# Patient Record
Sex: Male | Born: 1935
Health system: Southern US, Community
[De-identification: ages and names within clinical notes are randomized; demographics above are authoritative.]

## PROBLEM LIST (undated history)

## (undated) DIAGNOSIS — E119 Type 2 diabetes mellitus without complications: Secondary | ICD-10-CM

## (undated) DIAGNOSIS — N400 Enlarged prostate without lower urinary tract symptoms: Secondary | ICD-10-CM

## (undated) DIAGNOSIS — J449 Chronic obstructive pulmonary disease, unspecified: Secondary | ICD-10-CM

## (undated) DIAGNOSIS — I639 Cerebral infarction, unspecified: Secondary | ICD-10-CM

## (undated) DIAGNOSIS — R42 Dizziness and giddiness: Secondary | ICD-10-CM

## (undated) DIAGNOSIS — E785 Hyperlipidemia, unspecified: Secondary | ICD-10-CM

## (undated) DIAGNOSIS — I10 Essential (primary) hypertension: Secondary | ICD-10-CM

## (undated) DIAGNOSIS — N289 Disorder of kidney and ureter, unspecified: Secondary | ICD-10-CM

## (undated) HISTORY — DX: Hyperlipidemia, unspecified: E78.5

## (undated) HISTORY — DX: Cerebral infarction, unspecified: I63.9

## (undated) HISTORY — DX: Type 2 diabetes mellitus without complications: E11.9

## (undated) HISTORY — DX: Benign prostatic hyperplasia without lower urinary tract symptoms: N40.0

## (undated) HISTORY — DX: Chronic obstructive pulmonary disease, unspecified: J44.9

## (undated) HISTORY — DX: Dizziness and giddiness: R42

## (undated) HISTORY — PX: APPENDECTOMY: SHX54

---

## 2004-08-12 ENCOUNTER — Encounter (INDEPENDENT_AMBULATORY_CARE_PROVIDER_SITE_OTHER): Payer: Self-pay | Admitting: *Deleted

## 2004-08-12 ENCOUNTER — Ambulatory Visit (HOSPITAL_COMMUNITY): Admission: RE | Admit: 2004-08-12 | Discharge: 2004-08-12 | Payer: Self-pay | Admitting: Gastroenterology

## 2007-05-11 ENCOUNTER — Inpatient Hospital Stay (HOSPITAL_COMMUNITY): Admission: EM | Admit: 2007-05-11 | Discharge: 2007-05-15 | Payer: Self-pay | Admitting: Emergency Medicine

## 2007-06-06 ENCOUNTER — Inpatient Hospital Stay (HOSPITAL_COMMUNITY): Admission: EM | Admit: 2007-06-06 | Discharge: 2007-06-09 | Payer: Self-pay | Admitting: Emergency Medicine

## 2007-06-06 ENCOUNTER — Encounter (INDEPENDENT_AMBULATORY_CARE_PROVIDER_SITE_OTHER): Payer: Self-pay | Admitting: Surgery

## 2010-07-20 ENCOUNTER — Emergency Department (HOSPITAL_COMMUNITY): Admission: EM | Admit: 2010-07-20 | Discharge: 2010-07-20 | Payer: Self-pay | Admitting: Emergency Medicine

## 2011-01-26 NOTE — Op Note (Signed)
[name], [name]          ACCOUNT NO.:  [id number]   MEDICAL RECORD NO.:  [id number]          PATIENT TYPE:  INP   LOCATION:  5707                         FACILITY:  [facility]   PHYSICIAN:  Thornton Park. Daphine Deutscher, MD  DATE OF BIRTH:  1936-02-13   DATE OF PROCEDURE:  06/06/2007  DATE OF DISCHARGE:                               OPERATIVE REPORT   PREOPERATIVE DIAGNOSIS:  A ruptured appendix for interval appendectomy.   POSTOPERATIVE DIAGNOSIS:  A ruptured appendix for interval appendectomy.   PROCEDURE:  Laparoscopic appendectomy for perforated walled off  appendix.   SURGEON:  Thornton Park. Daphine Deutscher, MD   ANESTHESIA:  General.   DESCRIPTION OF PROCEDURE:  [name] was taken to the OR on  Tuesday night, June 06, 2007 approximately 9:00 p.m. and given  general anesthesia.  The abdomen was prepped with technique and draped  sterilely.  Access was gained through the umbilicus using Hasson  technique without difficulty.  After insufflation, 5 mm was placed in  the right upper quadrant and a 10-11 was placed obliquely in the left  lower quadrant.  The appendix had perforated and there was a tremendous  amount of inflammatory reaction in the right lower quadrant.  I used  blunt dissection to tease what appeared to be the appendiceal area and  abscess away and I got some drainage from the apparent abscess and I  sucked this out with the sucker and mobilized this.  I went across two  things that could have been stumps of the appendix and I divided those  with the Endo-GIA and dissect what actually I think was the stump of the  appendix.  I went ahead and then freed the rest from the mesentery using  a harmonic scalpel.  Bleeding was controlled with that and once I had at  all freed, I put it in a bag and brought it out through the umbilicus.  The small intestine was intact and was not damaged, nor was the cecum.  Bleeding was controlled.  I then irrigated and sucked out the  effluent  and then deflated the abdomen, closing the laparoscopic umbilicus under  laparoscopic vision. Again I was using the 30 degrees scope to see that  and once that was closed, I deflated the abdomen, closing the skin with  4-0 Vicryl, Benzoin Steri-Strips.  The areas were injected with some  Marcaine and the patient was taken to recovery room in satisfactory  condition.  He will be continued on Invanz which he received preop for  24 hours and will go back to the floor tonight.   FINAL DIAGNOSIS:  Status post laparoscopic appendectomy for perforated  walled off appendicitis.      Thornton Park Daphine Deutscher, MD  Electronically Signed     MBM/MEDQ  D:  06/06/2007  T:  06/07/2007  Job:  [id number]

## 2011-01-26 NOTE — Discharge Summary (Signed)
[name], [name]          ACCOUNT NO.:  [id number]   MEDICAL RECORD NO.:  [id number]          PATIENT TYPE:  INP   LOCATION:  5707                         FACILITY:  [facility]   PHYSICIAN:  Thornton Park. Daphine Deutscher, MD  DATE OF BIRTH:  July 22, 1936   DATE OF ADMISSION:  06/06/2007  DATE OF DISCHARGE:  06/09/2007                               DISCHARGE SUMMARY   ADMITTING DIAGNOSIS:  Recurrent appendicitis.   PROCEDURE:  Laparoscopic appendectomy on June 06, 2007, for  perforated wall appendicitis.   HOSPITAL COURSE:  [name] is a 75 year old gentleman who needed  an interval appendectomy, but had recurrent abdominal pain had  complications from his perforated appendix before that could be  scheduled electively.  He was brought in and done by me on June 06, 2007.  On postop day #1, he had difficulty voiding requiring a Foley  catheter which stayed in.  He was advanced to full liquids.  He was not  ready for discharge on September 25, but by June 09, 2007, he was  voiding without problems.  His lab was totally normal.  He was ready for  discharge.  He was given Tylox to take for pain.  Condition was good.  Return in 3 weeks.   DISCHARGE DIAGNOSIS:  Perforated appendix, status post laparoscopic  appendectomy.      Thornton Park Daphine Deutscher, MD  Electronically Signed     MBM/MEDQ  D:  06/09/2007  T:  06/09/2007  Job:  [id number]

## 2011-01-26 NOTE — H&P (Signed)
[name], [name] NO.:  [id number]   MEDICAL RECORD NO.:  [id number]          PATIENT TYPE:  INP   LOCATION:  5707                         FACILITY:  [facility]   PHYSICIAN:  Ardeth Sportsman, MD     DATE OF BIRTH:  [birth year]   DATE OF ADMISSION:  06/06/2007  DATE OF DISCHARGE:                              HISTORY & PHYSICAL   SURGEON:  Luretha Murphy, MD.   DIAGNOSIS:  Severe appendicitis with possible recurrence.   CHIEF COMPLAINT:  Recurrent abdominal pain and chills, with history of  appendicitis.   HISTORY OF PRESENT ILLNESS:  Mr. [name] is a 75 year old gentleman  who was seen at [facility] in late August, and tells me he was  admitted for 5 days on antibiotics, with surgical consultation by Dr.  Wenda Low.  He improved and was transitioned over to oral Augmentin.  The patient says he has been on this for about the past 3 weeks.  He was  due in followup with Dr. Daphine Deutscher today for consideration of timing of  possible interval appendectomy.  The patient noted about 2 to 3 days ago  he started having some chills and worsening abdominal pain.  He felt  some nausea but did not throw up.  He has not really had any diarrhea.  Normally he has  bowel movement about every day, but his bowels have  been irregular since this past month.  Based on worsening concerns, he  called the office, and recommendation was made for the patient to go to  the emergency room.  Emergency Room evaluated him, based on concerns  requested surgical evaluation.  Dr. Daphine Deutscher was not immediately  available, so he asked [place] to help take care of the patient.   PAST MEDICAL HISTORY:  1. Hypertension.  2. Hypercholesterolemia.  3. He had colonoscopy 3 years ago by Dr. Laural Benes, with a tubular      adenoma on a polypectomy.   PAST SURGICAL HISTORY:  Negative.   SOCIAL HISTORY:  Positive for tobacco.  Rarely any alcohol.  He is  currently retired.  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  Occasionally drinks  alcohol.   ALLERGIES:  NO KNOWN ALLERGIES.   MEDICATIONS:  Include:  1. Aspirin.  2. Augmentin.  3. Edwina Barth.  4. Simvastatin.   FAMILY HISTORY:  Noncontributory.   REVIEW OF SYSTEMS:  As noted per HPI.  CONSTITUTIONAL:  He has had some  subjective fevers and chills, and some sweats.  No weight gain or weight  loss.  EYES, ENT:  Negative.  CARDIAC:  He says normally he can walk  several miles a day without any difficulty.  He has had decreased  exercise tolerance with this abdominal pain for the past 3 months.  GU,  MUSCULOSKELETAL, NEUROLOGICAL, OPHTHALMOLOGIC, PSYCHIATRIC,  DERMATOLOGIC, TESTICULAR, BREASTS, are otherwise negative.   PHYSICAL EXAMINATION:  VITAL SIGNS:  T-max of 97.4.  Initially his blood  pressure was 191/99, with 8/10 pain.  Pulse 84.  Respirations 18.  After  getting a couple of Dilaudid his pulse  has come down to the 50's after  getting some metoprolol.  Has 98% sats on room air.  GENERAL:  He looks a little tired and disheveled, but not frankly toxic,  and in mild distress.  PSYCH:  He seems pleasant and interactive.  He speaks English pretty  well, but not 100% fluently.  No evidence of dementia, psychosis,  paranoia.  He has poor grooming.  EYES:  Pupils are equal, round and reactive to light.  Extraocular  movements are intact.  Sclerae are not icteric or injected.  HEENT:  He is normocephalic.  No facial asymmetry.  Mucous membranes are  dry.  Nasopharynx and oropharynx clear.  CHEST:  Clear to auscultation bilaterally.  No wheezes, rales, or  rhonchi.  HEART:  Regular rate and rhythm.  No murmurs, gallops or rubs.  No  carotid bruits.  Normal rate and sinus pedis pulses.  ABDOMEN:  The abdomen is obese, but soft.  He does have some tenderness  to palpation in his right lower quadrant and periumbilical region to  deep palpation, but no major peritonitis.   The rest of  the abdomen is  very soft.  GENITOURINARY:  Normal external male genitalia.  No evidence of inguinal  hernias.  RECTAL:  Deferred, by patient request.  EXTREMITIES:  No clubbing, cyanosis or edema.  MUSCULOSKELETAL:  Full range of motion of his shoulders, elbows, wrists,  hips, knees and ankles  LYMPH:  No head, neck, axillary or groin lymphadenopathy.  SKIN:  No obvious petechiae or purpura, no other sores or lesions.   LABORATORY VALUES:  He has a white count of 6.9, hemoglobin of 13.4.  He  has a borderline left shift.  His LFTs are normal.  His albumin is 4.2,  potassium is 3.4, BUN is 18, his creatinine is 1.1.  Urinalysis is  negative.  He does have a CT scan which shows persistent inflammation  around his appendix, although it seems to be decreased.  There is no  obvious localized  fluid collection.  He is full of stool in his colon  on the right side.  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  It may have increased 1 mm from 7.7 to around 9 mm in size.  There is no  other evidence of bowel stricture or any other abnormalities.   ASSESSMENT AND PLAN:  A 75 year old gentleman with diagnosed  appendicitis that was rather severe, improved on antibiotics, but now  with recurrent pain.   He still has some persistent abdominal pain with his fevers and chills.  I am wary of a man already on oral antibiotics to go home at this point.  I will defer to Dr. Daphine Deutscher on his final evaluation what to do.  I think  it would be reasonable to admit the patient, put him on IV antibiotics,  and follow closely.  Given the pseudoaneurysm near his iliac region, it  may be wise to try and let this thing cool down further before  operating, but that may not be a choice if he has persistent discomfort  on antibiotics.   There is no abscess to drain, so interventional radiology is probably  not a reasonable  choice at this time.  We will await a final decision by  Dr. Daphine Deutscher on what he wishes to do, but I suspect we will admit and  place him on IV  Invanz and IV fluids.  NPO right now.  When his pain subsides, and if he  does not have worsening leukocytosis or symptoms, then advance his diet  and consider either appendectomy now, versus transitioning to a  different antibiotic regimen such as Cipro and Flagyl, and follow  closely.      Ardeth Sportsman, MD  Electronically Signed     SCG/MEDQ  D:  06/06/2007  T:  06/06/2007  Job:  [id number]

## 2011-01-29 NOTE — Op Note (Signed)
NAMETERESO, UNANGST NO.:  [id number]   MEDICAL RECORD NO.:  [id number]          PATIENT TYPE:   LOCATION:                                 FACILITY:   PHYSICIAN:  Danise Edge, M.D.        DATE OF BIRTH:   DATE OF PROCEDURE:  08/12/2004  DATE OF DISCHARGE:                                 OPERATIVE REPORT   PROCEDURE PERFORMED:  Colonoscopy and polypectomy.   ENDOSCOPIST:  Charolett Bumpers, M.D.   INDICATIONS FOR PROCEDURE:  Mr. [name] is a 75 year old male  born 29-Mar-1936.  The patient is scheduled to undergo his first  screening colonoscopy with polypectomy to prevent colon cancer.   PREMEDICATION:  Versed 5 mg, Demerol 50 mg.   DESCRIPTION OF PROCEDURE:  After obtaining informed consent, Mr. [name]  was placed in the left lateral decubitus position.  I administered  intravenous Demerol and intravenous Versed to achieve conscious sedation for  the procedure.  The patient's blood pressure, oxygen saturations and cardiac  rhythm were monitored throughout the procedure and documented in the medical  record.   Anal inspection was normal.  Digital rectal exam revealed a nonnodular  prostate.  The Olympus adjustable pediatric colonoscope was introduced into  the rectum and advanced to the cecum.  Colonic preparation for the exam  today was excellent.   Rectum:  Normal.   Sigmoid colon and descending colon:  At 40 cm from the anal verge, a 2 mm  sessile polyp was removed with electrocautery snare.   Splenic flexure:  Normal.   Transverse colon:  From the distal transverse colon, a 1 mm sessile polyp  was removed with electrocautery snare.  From the proximal transverse colon,  a 2 mm sessile polyp was lifted by submucosal saline injection and removed  with electrocautery snare.   Hepatic flexure:  Normal.   Ascending colon:  Normal.   Cecum and ileocecal valve:  Normal.   ASSESSMENT:  Two small polyps were removed from the  transverse colon and a  small polyp was removed from the sigmoid colon.  All polyps were submitted  in one bottle for pathologic evaluation.       ___________________________________________  Danise Edge, M.D.    MJ/MEDQ  D:  08/12/2004  T:  08/12/2004  Job:  [id number]   cc:   Teena Irani. Arlyce Dice, M.D.  [id number]  [place]  [place] [address]  Fax: [phone]

## 2011-01-29 NOTE — Discharge Summary (Signed)
[name], [name]          ACCOUNT NO.:  [id number]   MEDICAL RECORD NO.:  [id number]          PATIENT TYPE:  INP   LOCATION:  1343                         FACILITY:  [facility]   PHYSICIAN:  Thornton Park. Daphine Deutscher, MD  DATE OF BIRTH:  October 25, 1935   DATE OF ADMISSION:  05/11/2007  DATE OF DISCHARGE:  05/15/2007                               DISCHARGE SUMMARY   DIAGNOSIS:  Ruptured appendicitis.   DISCHARGE DIAGNOSIS:  Ruptured appendicitis.   PLAN:  Interval appendectomy.   COURSE HOSPITAL:  This is a 75 year old gentleman who had a 3 day  history of abdominal pain and CT scan that suggested he had already  perforated his appendix.  This was reviewed for perc. drainage, but the  little collection was too small to drain, so he was placed on  antibiotics and started on Augmentin which he was kept on.  Arrangements  were made for him to come into the office to schedule an interval  appendectomy.   DISCHARGE DIAGNOSIS:  Ruptured appendix.   PLAN:  Return for interval appendectomy.   CONDITION:  Improved.      Thornton Park Daphine Deutscher, MD  Electronically Signed     MBM/MEDQ  D:  06/13/2007  T:  06/13/2007  Job:  [id number]   cc:   [facility]

## 2011-01-29 NOTE — Discharge Summary (Signed)
NAMETHURSTON, BRENDLINGER          ACCOUNT NO.:  [id number]   MEDICAL RECORD NO.:  [id number]          PATIENT TYPE:  INP   LOCATION:  5707                         FACILITY:  [facility]   PHYSICIAN:  Thornton Park. Daphine Deutscher, MD  DATE OF BIRTH:  13-Aug-1936   DATE OF ADMISSION:  06/06/2007  DATE OF DISCHARGE:  06/09/2007                               DISCHARGE SUMMARY   This chart indicates that I have already dictated a Discharge Summary,  but I will dictate it again.   ADMISSION DIAGNOSIS:  Recurrent appendicitis.   PROCEDURE:  June 06, 2007, laparoscopic appendectomy for perforated  walled off appendix.   COURSE IN HOSPITAL:  The patient came in the hospital and had the above-  mentioned operation. He did well.  He was discharged on September 26  with Tylenol for pain and asked return to the office in 3 weeks.   FINAL DIAGNOSIS:  Acute appendicitis with perforation.      Thornton Park Daphine Deutscher, MD  Electronically Signed     MBM/MEDQ  D:  07/03/2007  T:  07/03/2007  Job:  [id number]

## 2011-06-24 LAB — CBC
HCT: 39.4
Hemoglobin: 13.4
MCHC: 33.9
MCV: 88.9
MCV: 89.7
Platelets: 144 — ABNORMAL LOW
Platelets: 152
Platelets: 167
RBC: 4.09 — ABNORMAL LOW
RDW: 13.7
RDW: 14.1 — ABNORMAL HIGH
WBC: 5.8

## 2011-06-24 LAB — BASIC METABOLIC PANEL
BUN: 12
BUN: 9
CO2: 29
Calcium: 8.8
Chloride: 102
Chloride: 104
Creatinine, Ser: 1.13
GFR calc Af Amer: >60
GFR calc Af Amer: >60
GFR calc non Af Amer: 57 — ABNORMAL LOW
GFR calc non Af Amer: >60
Glucose, Bld: 111 — ABNORMAL HIGH
Potassium: 3.5
Potassium: 3.8
Sodium: 137

## 2011-06-24 LAB — HEPATIC FUNCTION PANEL
ALT: 11
Alkaline Phosphatase: 56
Bilirubin, Direct: <0.1

## 2011-06-24 LAB — DIFFERENTIAL
Basophils Absolute: 0
Basophils Absolute: 0.1
Basophils Relative: 0
Eosinophils Absolute: 0.2
Eosinophils Absolute: 0.3
Eosinophils Relative: 3
Eosinophils Relative: 3
Lymphocytes Relative: 21
Lymphocytes Relative: 21
Lymphocytes Relative: 22
Lymphs Abs: 1.2
Monocytes Absolute: 0.3
Monocytes Absolute: 0.5
Monocytes Relative: 6
Neutro Abs: 5.5
Neutrophils Relative %: 66

## 2011-06-24 LAB — I-STAT 8, (EC8 V) (CONVERTED LAB)
BUN: 18
Bicarbonate: 28.8 — ABNORMAL HIGH
Chloride: 106
pCO2, Ven: 51.3 — ABNORMAL HIGH
pH, Ven: 7.357 — ABNORMAL HIGH

## 2011-06-24 LAB — URINALYSIS, ROUTINE W REFLEX MICROSCOPIC
Glucose, UA: NEGATIVE
Specific Gravity, Urine: 1.018
pH: 7.5

## 2011-06-24 LAB — POCT I-STAT CREATININE: Creatinine, Ser: 1.1

## 2011-06-24 LAB — LIPASE, BLOOD: Lipase: 35

## 2011-06-25 LAB — URINALYSIS, ROUTINE W REFLEX MICROSCOPIC
Glucose, UA: NEGATIVE
Leukocytes, UA: NEGATIVE
Specific Gravity, Urine: 1.022
pH: 5.5

## 2011-06-25 LAB — CBC
HCT: 39.1
Hemoglobin: 11.5 — ABNORMAL LOW
Hemoglobin: 13.6
MCV: 89.4
RBC: 3.77 — ABNORMAL LOW
RBC: 4.38
RDW: 14
WBC: 10.6 — ABNORMAL HIGH

## 2011-06-25 LAB — DIFFERENTIAL
Basophils Absolute: 0
Basophils Absolute: 0
Basophils Relative: 0
Basophils Relative: 0
Lymphocytes Relative: 11 — ABNORMAL LOW
Lymphocytes Relative: 7 — ABNORMAL LOW
Monocytes Absolute: 0.4
Monocytes Relative: 5
Neutro Abs: 7.6
Neutro Abs: 9.4 — ABNORMAL HIGH
Neutrophils Relative %: 83 — ABNORMAL HIGH

## 2011-06-25 LAB — COMPREHENSIVE METABOLIC PANEL
Alkaline Phosphatase: 47
BUN: 17
CO2: 22
Chloride: 105
Creatinine, Ser: 1.31
GFR calc non Af Amer: 54 — ABNORMAL LOW
Glucose, Bld: 119 — ABNORMAL HIGH
Total Bilirubin: 1.3 — ABNORMAL HIGH

## 2011-06-25 LAB — URINE MICROSCOPIC-ADD ON

## 2011-06-25 LAB — LIPASE, BLOOD: Lipase: 13

## 2011-09-27 ENCOUNTER — Other Ambulatory Visit: Payer: Self-pay | Admitting: Gastroenterology

## 2012-01-31 ENCOUNTER — Ambulatory Visit
Admission: RE | Admit: 2012-01-31 | Discharge: 2012-01-31 | Disposition: A | Discharge status: Home / Self Care | Payer: Medicare Other | Source: Ambulatory Visit | Attending: Internal Medicine | Admitting: Internal Medicine

## 2012-01-31 ENCOUNTER — Other Ambulatory Visit: Payer: Self-pay | Admitting: Internal Medicine

## 2012-01-31 DIAGNOSIS — R05 Cough: Secondary | ICD-10-CM

## 2012-03-01 ENCOUNTER — Emergency Department (HOSPITAL_COMMUNITY): Payer: Medicare Other

## 2012-03-01 ENCOUNTER — Inpatient Hospital Stay (HOSPITAL_COMMUNITY)
Admission: EM | Admit: 2012-03-01 | Discharge: 2012-03-03 | DRG: 065 | Disposition: A | Discharge status: Home / Self Care | Payer: Medicare Other | Attending: Internal Medicine | Admitting: Internal Medicine

## 2012-03-01 ENCOUNTER — Encounter (HOSPITAL_COMMUNITY): Payer: Self-pay | Admitting: Emergency Medicine

## 2012-03-01 DIAGNOSIS — I639 Cerebral infarction, unspecified: Secondary | ICD-10-CM

## 2012-03-01 DIAGNOSIS — R2990 Unspecified symptoms and signs involving the nervous system: Secondary | ICD-10-CM

## 2012-03-01 DIAGNOSIS — N289 Disorder of kidney and ureter, unspecified: Secondary | ICD-10-CM

## 2012-03-01 DIAGNOSIS — E876 Hypokalemia: Secondary | ICD-10-CM

## 2012-03-01 DIAGNOSIS — I6350 Cerebral infarction due to unspecified occlusion or stenosis of unspecified cerebral artery: Principal | ICD-10-CM | POA: Diagnosis present

## 2012-03-01 DIAGNOSIS — R4789 Other speech disturbances: Secondary | ICD-10-CM | POA: Diagnosis present

## 2012-03-01 DIAGNOSIS — I6340 Cerebral infarction due to embolism of unspecified cerebral artery: Secondary | ICD-10-CM

## 2012-03-01 DIAGNOSIS — I129 Hypertensive chronic kidney disease with stage 1 through stage 4 chronic kidney disease, or unspecified chronic kidney disease: Secondary | ICD-10-CM | POA: Diagnosis present

## 2012-03-01 DIAGNOSIS — D631 Anemia in chronic kidney disease: Secondary | ICD-10-CM | POA: Diagnosis present

## 2012-03-01 DIAGNOSIS — Z87891 Personal history of nicotine dependence: Secondary | ICD-10-CM

## 2012-03-01 DIAGNOSIS — E1169 Type 2 diabetes mellitus with other specified complication: Secondary | ICD-10-CM | POA: Insufficient documentation

## 2012-03-01 DIAGNOSIS — G8190 Hemiplegia, unspecified affecting unspecified side: Secondary | ICD-10-CM | POA: Diagnosis present

## 2012-03-01 DIAGNOSIS — E785 Hyperlipidemia, unspecified: Secondary | ICD-10-CM | POA: Diagnosis present

## 2012-03-01 DIAGNOSIS — Z79899 Other long term (current) drug therapy: Secondary | ICD-10-CM

## 2012-03-01 DIAGNOSIS — D649 Anemia, unspecified: Secondary | ICD-10-CM

## 2012-03-01 DIAGNOSIS — N189 Chronic kidney disease, unspecified: Secondary | ICD-10-CM | POA: Diagnosis present

## 2012-03-01 DIAGNOSIS — R29810 Facial weakness: Secondary | ICD-10-CM | POA: Diagnosis present

## 2012-03-01 DIAGNOSIS — I10 Essential (primary) hypertension: Secondary | ICD-10-CM

## 2012-03-01 HISTORY — DX: Disorder of kidney and ureter, unspecified: N28.9

## 2012-03-01 HISTORY — DX: Essential (primary) hypertension: I10

## 2012-03-01 LAB — COMPREHENSIVE METABOLIC PANEL
ALT: 12 U/L (ref 0–53)
AST: 17 U/L (ref 0–37)
Albumin: 3.9 g/dL (ref 3.5–5.2)
Alkaline Phosphatase: 50 U/L (ref 39–117)
BUN: 26 mg/dL — ABNORMAL HIGH (ref 6–23)
Chloride: 105 mEq/L (ref 96–112)
Potassium: 3.7 mEq/L (ref 3.5–5.1)
Sodium: 140 mEq/L (ref 135–145)
Total Bilirubin: 0.4 mg/dL (ref 0.3–1.2)
Total Protein: 7.3 g/dL (ref 6.0–8.3)

## 2012-03-01 LAB — DIFFERENTIAL
Basophils Absolute: 0 10*3/uL (ref 0.0–0.1)
Basophils Relative: 1 % (ref 0–1)
Eosinophils Absolute: 0.2 10*3/uL (ref 0.0–0.7)
Monocytes Relative: 6 % (ref 3–12)
Neutro Abs: 3.8 10*3/uL (ref 1.7–7.7)
Neutrophils Relative %: 65 % (ref 43–77)

## 2012-03-01 LAB — TROPONIN I: Troponin I: <0.3 ng/mL (ref <0.30)

## 2012-03-01 LAB — POCT I-STAT, CHEM 8
Chloride: 107 mEq/L (ref 96–112)
Glucose, Bld: 133 mg/dL — ABNORMAL HIGH (ref 70–99)
HCT: 39 % (ref 39.0–52.0)
Potassium: 3.8 mEq/L (ref 3.5–5.1)
Sodium: 144 mEq/L (ref 135–145)

## 2012-03-01 LAB — PROTIME-INR
INR: 1.05 (ref 0.00–1.49)
Prothrombin Time: 13.9 seconds (ref 11.6–15.2)

## 2012-03-01 LAB — CBC
Hemoglobin: 12.3 g/dL — ABNORMAL LOW (ref 13.0–17.0)
MCH: 29.6 pg (ref 26.0–34.0)
MCHC: 32.3 g/dL (ref 30.0–36.0)
Platelets: 159 10*3/uL (ref 150–400)

## 2012-03-01 LAB — APTT: aPTT: 30 seconds (ref 24–37)

## 2012-03-01 LAB — CK TOTAL AND CKMB (NOT AT ARMC): Total CK: 247 U/L — ABNORMAL HIGH (ref 7–232)

## 2012-03-01 MED ORDER — ASPIRIN 81 MG PO CHEW
324.0000 mg | CHEWABLE_TABLET | Freq: Once | ORAL | Status: AC
  Administered 2012-03-01: 324 mg via ORAL
  Filled 2012-03-01: qty 4

## 2012-03-01 NOTE — ED Notes (Signed)
Blood sugar 136

## 2012-03-01 NOTE — Consult Note (Signed)
  Chief Complaint: Left facial and upper extremity weakness.  HPI: [name] is an 76 y.o. male history of hypertension and hyperlipidemia experiencing onset of weakness involving his left hand while gardening at about 6 PM today. Family noted that he had left lower facial weakness and speech was slightly slurred. There is no previous history of stroke nor TIA. He has not been on antiplatelet therapy. CT scan of his head showed no acute intracranial abnormality. NIH stroke score was 2. Patient was not considered a candidate for thrombolytic therapy with TPA because of mild deficits only.  LSN: 6 PM today tPA Given: No: Mild deficits only MRankin: 0  Past Medical History  Diagnosis Date  . Renal disorder     History reviewed. No pertinent family history.   Medications: Prior to Admission:  Norvasc 2.5 mg per day Fenofibrate 160 mg per day Lisinopril 10 mg per day  Physical Examination: Blood pressure 176/97, pulse 74, temperature 98.3 F (36.8 C), temperature source Oral, resp. rate 21, height 5\' 5"  (1.651 m), weight 71.668 kg (158 lb), SpO2 99.00%.  Neurologic Examination: Mental Status: Alert, oriented, thought content appropriate.  Speech minimally dysarthric without evidence of aphasia. Able to follow commands without difficulty. Cranial Nerves: II-Visual fields were normal. III/IV/VI-Pupils were equal and reacted. Extraocular movements were full and conjugate.    V/VII-no facial numbness, mild left lower facial weakness. VIII-normal. X-minimal dysarthria. Motor: Slightly reduced strength distally involving left upper extremity compared to the right; otherwise normal motor exam. Sensory: Normal throughout. Deep Tendon Reflexes: 2+ and symmetric. Plantars: Mute bilaterally Cerebellar: Normal finger-to-nose testing. Carotid auscultation: Normal   Ct Head Wo Contrast  03/01/2012  *RADIOLOGY REPORT*  Clinical Data: 76 year old male with left arm pain.  Stroke  symptoms.  CT HEAD WITHOUT CONTRAST  Technique:  Contiguous axial images were obtained from the base of the skull through the vertex without contrast.  Comparison: None.  Findings: Sclerosis of the left mastoids probably is chronic. There may be an anterior ethmoid mucocele on the right.  Other paranasal sinuses and mastoids are clear.  No acute osseous abnormality identified.  Visualized orbits and scalp soft tissues are within normal limits.  No ventriculomegaly. No midline shift, mass effect, or evidence of mass lesion.  No acute intracranial hemorrhage identified.  No evidence of cortically based acute infarction identified.  Normal gray-white matter differentiation throughout the brain.  Somewhat conspicuous left ICA terminus and left MCA posterior sylvian division, but similar to other intracranial vascular density.  Overall no suspicious intracranial vascular hyperdensity.  IMPRESSION: Noncontrast CT appearance of the brain is within normal limits for age.  Original Report Authenticated By: Harley Hallmark, M.D.    Assessment: 76 y.o. male with probable acute right subcortical small vessel ischemic infarction.  Stroke Risk Factors - hyperlipidemia and hypertension  Plan: 1. HgbA1c, fasting lipid panel 2. MRI, MRA  of the brain without contrast 3. PT consult, OT consult, Speech consult 4. Echocardiogram 5. Carotid dopplers 6. Prophylactic therapy-Antiplatelet med: Aspirin 325 mg per day 7. Risk factor modification 8. Telemetry monitoring  C.R. Roseanne Reno, MD [facility] [phone]  03/01/2012, 10:27 PM

## 2012-03-01 NOTE — H&P (Addendum)
[name] is an 76 y.o. male.   PCP - Dr.Ronald Su Hilt. Chief Complaint: Left upper extremity weakness. HPI: 76 year old male with history of hypertension and hyperlipidemia and was recently found to have mild hyperglycemia by the PCP last evening around 6 PM while in his garden and his house suddenly felt weak in his left hand and decreased grip strength. Family who was by the side also noticed some left-sided facial droop and slurred speech. Patient was brought to the ER. CT of the head was negative for anything acute. Neurologist Dr. Roseanne Reno had assessed the patient and felt the patient is not a candidate for TPA due to minimal deficits. Patient at this time will be admitted for further management. Patient still have mild weakness in the left upper extremity. There is no obvious facial asymmetry at this time. Patient denies any difficulty swallowing or did not have any visual symptoms. Denies any headache or loss of consciousness.   Past Medical History  Diagnosis Date  . Renal disorder   . Hypertension     History reviewed. No pertinent past surgical history.  History reviewed. No pertinent family history. Social History:  reports that he has quit smoking. He does not have any smokeless tobacco history on file. He reports that he drinks alcohol. He reports that he does not use illicit drugs.  Allergies: No Known Allergies   (Not in a hospital admission)  Results for orders placed during the hospital encounter of 03/01/12 (from the past 48 hour(s))  GLUCOSE, CAPILLARY     Status: Abnormal   Collection Time   03/01/12  8:16 PM      Component Value Range Comment   Glucose-Capillary 136 (*) 70 - 99 mg/dL   PROTIME-INR     Status: Normal   Collection Time   03/01/12  8:40 PM      Component Value Range Comment   Prothrombin Time 13.9  11.6 - 15.2 seconds    INR 1.05  0.00 - 1.49   APTT     Status: Normal   Collection Time   03/01/12  8:40 PM      Component Value Range Comment    aPTT 30  24 - 37 seconds   CBC     Status: Abnormal   Collection Time   03/01/12  8:40 PM      Component Value Range Comment   WBC 5.8  4.0 - 10.5 K/uL    RBC 4.16 (*) 4.22 - 5.81 MIL/uL    Hemoglobin 12.3 (*) 13.0 - 17.0 g/dL    HCT 40.9 (*) 81.1 - 52.0 %    MCV 91.6  78.0 - 100.0 fL    MCH 29.6  26.0 - 34.0 pg    MCHC 32.3  30.0 - 36.0 g/dL    RDW 91.4  78.2 - 95.6 %    Platelets 159  150 - 400 K/uL   DIFFERENTIAL     Status: Normal   Collection Time   03/01/12  8:40 PM      Component Value Range Comment   Neutrophils Relative 65  43 - 77 %    Neutro Abs 3.8  1.7 - 7.7 K/uL    Lymphocytes Relative 24  12 - 46 %    Lymphs Abs 1.4  0.7 - 4.0 K/uL    Monocytes Relative 6  3 - 12 %    Monocytes Absolute 0.4  0.1 - 1.0 K/uL    Eosinophils Relative 4  0 - 5 %  Eosinophils Absolute 0.2  0.0 - 0.7 K/uL    Basophils Relative 1  0 - 1 %    Basophils Absolute 0.0  0.0 - 0.1 K/uL   COMPREHENSIVE METABOLIC PANEL     Status: Abnormal   Collection Time   03/01/12  8:40 PM      Component Value Range Comment   Sodium 140  135 - 145 mEq/L    Potassium 3.7  3.5 - 5.1 mEq/L    Chloride 105  96 - 112 mEq/L    CO2 25  19 - 32 mEq/L    Glucose, Bld 138 (*) 70 - 99 mg/dL    BUN 26 (*) 6 - 23 mg/dL    Creatinine, Ser 7.82 (*) 0.50 - 1.35 mg/dL    Calcium 9.7  8.4 - 95.6 mg/dL    Total Protein 7.3  6.0 - 8.3 g/dL    Albumin 3.9  3.5 - 5.2 g/dL    AST 17  0 - 37 U/L    ALT 12  0 - 53 U/L    Alkaline Phosphatase 50  39 - 117 U/L    Total Bilirubin 0.4  0.3 - 1.2 mg/dL    GFR calc non Af Amer 45 (*) >90 mL/min    GFR calc Af Amer 52 (*) >90 mL/min   CK TOTAL AND CKMB     Status: Abnormal   Collection Time   03/01/12  8:40 PM      Component Value Range Comment   Total CK 247 (*) 7 - 232 U/L    CK, MB 3.3  0.3 - 4.0 ng/mL    Relative Index 1.3  0.0 - 2.5   TROPONIN I     Status: Normal   Collection Time   03/01/12  8:40 PM      Component Value Range Comment   Troponin I <0.30  <0.30  ng/mL   POCT I-STAT, CHEM 8     Status: Abnormal   Collection Time   03/01/12  8:51 PM      Component Value Range Comment   Sodium 144  135 - 145 mEq/L    Potassium 3.8  3.5 - 5.1 mEq/L    Chloride 107  96 - 112 mEq/L    BUN 29 (*) 6 - 23 mg/dL    Creatinine, Ser 2.13 (*) 0.50 - 1.35 mg/dL    Glucose, Bld 086 (*) 70 - 99 mg/dL    Calcium, Ion 5.78  4.69 - 1.32 mmol/L    TCO2 22  0 - 100 mmol/L    Hemoglobin 13.3  13.0 - 17.0 g/dL    HCT 62.9  52.8 - 41.3 %    Ct Head Wo Contrast  03/01/2012  *RADIOLOGY REPORT*  Clinical Data: 77 year old male with left arm pain.  Stroke symptoms.  CT HEAD WITHOUT CONTRAST  Technique:  Contiguous axial images were obtained from the base of the skull through the vertex without contrast.  Comparison: None.  Findings: Sclerosis of the left mastoids probably is chronic. There may be an anterior ethmoid mucocele on the right.  Other paranasal sinuses and mastoids are clear.  No acute osseous abnormality identified.  Visualized orbits and scalp soft tissues are within normal limits.  No ventriculomegaly. No midline shift, mass effect, or evidence of mass lesion.  No acute intracranial hemorrhage identified.  No evidence of cortically based acute infarction identified.  Normal gray-white matter differentiation throughout the brain.  Somewhat conspicuous left ICA terminus and left  MCA posterior sylvian division, but similar to other intracranial vascular density.  Overall no suspicious intracranial vascular hyperdensity.  IMPRESSION: Noncontrast CT appearance of the brain is within normal limits for age.  Original Report Authenticated By: Harley Hallmark, M.D.    Review of Systems  Constitutional: Negative.   HENT: Negative.   Eyes: Negative.   Respiratory: Negative.   Cardiovascular: Negative.   Gastrointestinal: Negative.   Genitourinary: Negative.   Musculoskeletal: Negative.   Skin: Negative.   Neurological:       Left upper extremity weakness.    Endo/Heme/Allergies: Negative.   Psychiatric/Behavioral: Negative.     Blood pressure 151/88, pulse 63, temperature 98.3 F (36.8 C), temperature source Oral, resp. rate 20, height 5\' 5"  (1.651 m), weight 71.668 kg (158 lb), SpO2 98.00%. Physical Exam  Constitutional: He is oriented to person, place, and time. He appears well-developed and well-nourished. No distress.  HENT:  Head: Normocephalic and atraumatic.  Right Ear: External ear normal.  Left Ear: External ear normal.  Nose: Nose normal.  Mouth/Throat: Oropharynx is clear and moist. No oropharyngeal exudate.  Eyes: Conjunctivae are normal. Pupils are equal, round, and reactive to light. Right eye exhibits no discharge. Left eye exhibits no discharge. No scleral icterus.  Neck: Normal range of motion. Neck supple.  Cardiovascular: Normal rate and regular rhythm.   Respiratory: Effort normal and breath sounds normal. No respiratory distress. He has no wheezes. He has no rales.  GI: Soft. Bowel sounds are normal. He exhibits no distension. There is no tenderness. There is no rebound.  Neurological: He is alert and oriented to person, place, and time.       Left upper extremity weakness 2/5. Rest of the extremities are 5/5. No facial asymmetry. No tongue deviation.  Skin: Skin is warm and dry. He is not diaphoretic.  Psychiatric: His behavior is normal.     Assessment/Plan #1. CVA  - patient will be placed on neuro checks. Swallow evaluation. MRI/MRA brain, carotid Doppler and 2-D echo. Aspirin. Telemetry shows sinus rhythm. #2. Hypertension - continue present medications. If creatinine worsens may have to hold lisinopril. #3. Hyperlipidemia - continue present medication. Check lipid panel.  #4. Renal insufficiency probably chronic - follow metabolic panel. Check urinalysis. May have to hold lisinopril if creatinine worsens. #5. Mild normocytic normochromic anemia - may be from chronic kidney disease. Check anemia panel. Follow  CBC. Patient states he had a colonoscopy 3 months ago which was normal. #6. History of cigarette smoking quit 3 years ago  -  follow chest x-ray.   CODE STATUS - full code.  Eduard Clos. 03/01/2012, 11:57 PM

## 2012-03-01 NOTE — Code Documentation (Signed)
Patient arrived via [facility] from [facility] ED to [facility] ED at 2112. Patient reported left arm weakness and facial droop at 1800 while out in the garden. Code stroke called at 2043, patient arrived to [facility] ED at 2112, EDP exam at [facility] ED 2113, Stroke team arrived at 2100, LSN at 1800, patient had CT and Lab work at [facility] ED, CT read by Dr. Roseanne Reno at 2113. NIH 02 for slurred speech and facial droop, code stroke cancelled by Dr. Roseanne Reno at 2132.

## 2012-03-01 NOTE — ED Notes (Signed)
[facility] at bedside 

## 2012-03-01 NOTE — ED Provider Notes (Signed)
History     CSN: [id number]  Arrival date & time 03/01/12  1956   First MD Initiated Contact with Patient 03/01/12 2019      Chief Complaint  Patient presents with  . Stroke Symptoms    (Consider location/radiation/quality/duration/timing/severity/associated sxs/prior treatment) Patient is a 76 y.o. male presenting with neurologic complaint. The history is provided by the patient. No language interpreter was used.  Neurologic Problem The primary symptoms include paresthesias, focal weakness, loss of sensation and speech change. Primary symptoms do not include headaches, syncope, loss of consciousness, altered mental status, seizures, dizziness, visual change, memory loss, fever, nausea or vomiting. The symptoms began 2 to 6 hours ago. The symptoms are worsening. The neurological symptoms are focal.  Paresthesias began 1 - 3 hours ago. The paresthesias are worsening. The paresthesias are described as tingling. Affected locations include the: left upper arm, left forearm and left hand.  Weakness began 1 - 3 hours ago. The weakness is worsening.  Change in speech began 1 - 3 hours ago. The speech change is worsening. Features of the speech change include inability to speak fluently.  Additional symptoms include weakness. Additional symptoms do not include neck stiffness, lower back pain, leg pain, loss of balance or photophobia. Workup history includes CT scan.    Past Medical History  Diagnosis Date  . Renal disorder     History reviewed. No pertinent past surgical history.  History reviewed. No pertinent family history.  History  Substance Use Topics  . Smoking status: Never Smoker   . Smokeless tobacco: Not on file  . Alcohol Use: No      Review of Systems  Constitutional: Negative for fever, activity change, appetite change, fatigue and unexpected weight change.  HENT: Negative for congestion, sore throat, rhinorrhea, neck pain and neck stiffness.   Eyes: Negative for  photophobia.  Respiratory: Negative for cough and shortness of breath.   Cardiovascular: Negative for chest pain, palpitations and syncope.  Gastrointestinal: Negative for nausea, vomiting, abdominal pain, diarrhea and constipation.  Genitourinary: Negative for dysuria, urgency, frequency and flank pain.  Musculoskeletal: Negative for myalgias, back pain and arthralgias.  Neurological: Positive for speech change, focal weakness, weakness, numbness and paresthesias. Negative for dizziness, seizures, loss of consciousness, light-headedness, headaches and loss of balance.  Psychiatric/Behavioral: Negative for memory loss and altered mental status.  All other systems reviewed and are negative.    Allergies  Review of patient's allergies indicates no known allergies.  Home Medications   Current Outpatient Rx  Name Route Sig Dispense Refill  . AMLODIPINE BESYLATE 2.5 MG PO TABS Oral Take 2.5 mg by mouth daily.    . FENOFIBRATE 160 MG PO TABS Oral Take 160 mg by mouth daily.      BP 151/83  Pulse 74  Resp 13  SpO2 100%  Physical Exam  Nursing note and vitals reviewed. Constitutional: He is oriented to person, place, and time. He appears well-developed and well-nourished. No distress.  HENT:  Head: Normocephalic and atraumatic.  Mouth/Throat: Oropharynx is clear and moist. No oropharyngeal exudate.  Eyes: Conjunctivae and EOM are normal. Pupils are equal, round, and reactive to light.  Neck: Normal range of motion. Neck supple.  Cardiovascular: Normal rate, regular rhythm, normal heart sounds and intact distal pulses.  Exam reveals no gallop and no friction rub.   No murmur heard. Pulmonary/Chest: Effort normal and breath sounds normal. No respiratory distress. He exhibits no tenderness.  Abdominal: Soft. Bowel sounds are normal. There is no tenderness.  There is no rebound and no guarding.  Musculoskeletal: Normal range of motion. He exhibits no edema and no tenderness.    Neurological: He is alert and oriented to person, place, and time. A sensory deficit is present. No cranial nerve deficit.       4/5 strength in the left upper extremity as well as asymmetric grip strength. He also has a sensory deficit on the left. He also has difficulty expressing words with slurring of speech  Skin: Skin is warm and dry. No rash noted.    ED Course  Procedures (including critical care time)  CRITICAL CARE Performed by: Dayton Bailiff   Total critical care time: 30 min  Critical care time was exclusive of separately billable procedures and treating other patients.  Critical care was necessary to treat or prevent imminent or life-threatening deterioration.  Critical care was time spent personally by me on the following activities: development of treatment plan with patient and/or surrogate as well as nursing, discussions with consultants, evaluation of patient's response to treatment, examination of patient, obtaining history from patient or surrogate, ordering and performing treatments and interventions, ordering and review of laboratory studies, ordering and review of radiographic studies, pulse oximetry and re-evaluation of patient's condition.   Date: 03/01/2012  Rate: 69  Rhythm: normal sinus rhythm  QRS Axis: normal  Intervals: normal  ST/T Wave abnormalities: normal  Conduction Disutrbances:none  Narrative Interpretation:   Old EKG Reviewed: unchanged  Labs Reviewed  GLUCOSE, CAPILLARY - Abnormal; Notable for the following:    Glucose-Capillary 136 (*)     All other components within normal limits  POCT I-STAT, CHEM 8 - Abnormal; Notable for the following:    BUN 29 (*)     Creatinine, Ser 1.60 (*)     Glucose, Bld 133 (*)     All other components within normal limits  PROTIME-INR  APTT  CBC  DIFFERENTIAL  COMPREHENSIVE METABOLIC PANEL  CK TOTAL AND CKMB  TROPONIN I  URINE RAPID DRUG SCREEN (HOSP PERFORMED)   Ct Head Wo Contrast  03/01/2012   *RADIOLOGY REPORT*  Clinical Data: 76 year old male with left arm pain.  Stroke symptoms.  CT HEAD WITHOUT CONTRAST  Technique:  Contiguous axial images were obtained from the base of the skull through the vertex without contrast.  Comparison: None.  Findings: Sclerosis of the left mastoids probably is chronic. There may be an anterior ethmoid mucocele on the right.  Other paranasal sinuses and mastoids are clear.  No acute osseous abnormality identified.  Visualized orbits and scalp soft tissues are within normal limits.  No ventriculomegaly. No midline shift, mass effect, or evidence of mass lesion.  No acute intracranial hemorrhage identified.  No evidence of cortically based acute infarction identified.  Normal gray-white matter differentiation throughout the brain.  Somewhat conspicuous left ICA terminus and left MCA posterior sylvian division, but similar to other intracranial vascular density.  Overall no suspicious intracranial vascular hyperdensity.  IMPRESSION: Noncontrast CT appearance of the brain is within normal limits for age.  Original Report Authenticated By: Harley Hallmark, M.D.     1. Stroke-like symptoms       MDM  Strokelike symptoms. Code CVA was called as he was within the three-hour window. CT of the head is negative. Given aspirin prior to transfer. Will be transferred to [facility]. Discussed with Dr. Roseanne Reno the neurologist. Code stroke labs were obtained. EKG was also obtained prior to transfer. Also discussed with the [facility] emergency physician.  Dayton Bailiff, MD 03/01/12 2101

## 2012-03-01 NOTE — ED Notes (Signed)
Patients family brought in for numbness and tingling bto his left are. Patient has slurred speech and droop

## 2012-03-01 NOTE — ED Notes (Signed)
Pt and pt's wife given cold drinks. No other needs voiced at this time.

## 2012-03-01 NOTE — ED Notes (Signed)
Rapid response RN to enter NIH stroke scale score.

## 2012-03-01 NOTE — ED Notes (Signed)
Code stroke cancelled by neurologist.  

## 2012-03-02 ENCOUNTER — Inpatient Hospital Stay (HOSPITAL_COMMUNITY): Payer: Medicare Other

## 2012-03-02 DIAGNOSIS — N19 Unspecified kidney failure: Secondary | ICD-10-CM

## 2012-03-02 DIAGNOSIS — G459 Transient cerebral ischemic attack, unspecified: Secondary | ICD-10-CM

## 2012-03-02 DIAGNOSIS — R112 Nausea with vomiting, unspecified: Secondary | ICD-10-CM

## 2012-03-02 DIAGNOSIS — I6340 Cerebral infarction due to embolism of unspecified cerebral artery: Secondary | ICD-10-CM

## 2012-03-02 DIAGNOSIS — I10 Essential (primary) hypertension: Secondary | ICD-10-CM

## 2012-03-02 DIAGNOSIS — E876 Hypokalemia: Secondary | ICD-10-CM | POA: Diagnosis present

## 2012-03-02 DIAGNOSIS — D649 Anemia, unspecified: Secondary | ICD-10-CM | POA: Diagnosis present

## 2012-03-02 LAB — COMPREHENSIVE METABOLIC PANEL
ALT: 9 U/L (ref 0–53)
AST: 13 U/L (ref 0–37)
Albumin: 3.5 g/dL (ref 3.5–5.2)
Chloride: 108 mEq/L (ref 96–112)
Creatinine, Ser: 1.22 mg/dL (ref 0.50–1.35)
Sodium: 142 mEq/L (ref 135–145)
Total Bilirubin: 0.4 mg/dL (ref 0.3–1.2)

## 2012-03-02 LAB — CBC
Hemoglobin: 12 g/dL — ABNORMAL LOW (ref 13.0–17.0)
MCH: 29.4 pg (ref 26.0–34.0)
MCV: 90.2 fL (ref 78.0–100.0)
Platelets: 144 10*3/uL — ABNORMAL LOW (ref 150–400)
RBC: 4.08 MIL/uL — ABNORMAL LOW (ref 4.22–5.81)

## 2012-03-02 LAB — RAPID URINE DRUG SCREEN, HOSP PERFORMED
Amphetamines: NOT DETECTED
Cocaine: NOT DETECTED
Opiates: NOT DETECTED
Tetrahydrocannabinol: NOT DETECTED

## 2012-03-02 LAB — URINALYSIS, ROUTINE W REFLEX MICROSCOPIC
Ketones, ur: NEGATIVE mg/dL
Leukocytes, UA: NEGATIVE
Nitrite: NEGATIVE
Specific Gravity, Urine: 1.019 (ref 1.005–1.030)
pH: 6 (ref 5.0–8.0)

## 2012-03-02 LAB — IRON AND TIBC
Saturation Ratios: 22 % (ref 20–55)
TIBC: 362 ug/dL (ref 215–435)

## 2012-03-02 LAB — LIPID PANEL
Cholesterol: 147 mg/dL (ref 0–200)
LDL Cholesterol: 94 mg/dL (ref 0–99)
Triglycerides: 98 mg/dL (ref <150)

## 2012-03-02 LAB — FERRITIN: Ferritin: 225 ng/mL (ref 22–322)

## 2012-03-02 LAB — RETICULOCYTES: Retic Ct Pct: 1.1 % (ref 0.4–3.1)

## 2012-03-02 MED ORDER — ASPIRIN 325 MG PO TABS
325.0000 mg | ORAL_TABLET | Freq: Every day | ORAL | Status: DC
  Administered 2012-03-02 – 2012-03-03 (×2): 325 mg via ORAL
  Filled 2012-03-02 (×2): qty 1

## 2012-03-02 MED ORDER — AMLODIPINE BESYLATE 2.5 MG PO TABS
2.5000 mg | ORAL_TABLET | Freq: Every day | ORAL | Status: DC
  Administered 2012-03-03: 2.5 mg via ORAL
  Filled 2012-03-02 (×3): qty 1

## 2012-03-02 MED ORDER — SODIUM CHLORIDE 0.9 % IV SOLN
INTRAVENOUS | Status: DC
  Administered 2012-03-02: 03:00:00 via INTRAVENOUS

## 2012-03-02 MED ORDER — FENOFIBRATE 160 MG PO TABS
160.0000 mg | ORAL_TABLET | Freq: Every day | ORAL | Status: DC
  Administered 2012-03-02 – 2012-03-03 (×2): 160 mg via ORAL
  Filled 2012-03-02 (×2): qty 1

## 2012-03-02 MED ORDER — ASPIRIN 300 MG RE SUPP
300.0000 mg | Freq: Every day | RECTAL | Status: DC
  Filled 2012-03-02 (×2): qty 1

## 2012-03-02 MED ORDER — SENNOSIDES-DOCUSATE SODIUM 8.6-50 MG PO TABS
1.0000 | ORAL_TABLET | Freq: Every evening | ORAL | Status: DC | PRN
  Administered 2012-03-02: 1 via ORAL
  Filled 2012-03-02: qty 1

## 2012-03-02 MED ORDER — LISINOPRIL 10 MG PO TABS
10.0000 mg | ORAL_TABLET | Freq: Every day | ORAL | Status: DC
  Administered 2012-03-02 – 2012-03-03 (×2): 10 mg via ORAL
  Filled 2012-03-02 (×2): qty 1

## 2012-03-02 MED ORDER — POTASSIUM CHLORIDE CRYS ER 20 MEQ PO TBCR
40.0000 meq | EXTENDED_RELEASE_TABLET | Freq: Once | ORAL | Status: AC
  Administered 2012-03-02: 40 meq via ORAL
  Filled 2012-03-02: qty 2

## 2012-03-02 MED ORDER — ENOXAPARIN SODIUM 40 MG/0.4ML ~~LOC~~ SOLN
40.0000 mg | SUBCUTANEOUS | Status: DC
  Administered 2012-03-02 – 2012-03-03 (×2): 40 mg via SUBCUTANEOUS
  Filled 2012-03-02 (×2): qty 0.4

## 2012-03-02 NOTE — Evaluation (Signed)
Occupational Therapy Evaluation Patient Details Name: [name] MRN: [id number] DOB: December 01, 1935 Today's Date: 03/02/2012 Time: 4098-1191 OT Time Calculation (min): 18 min  OT Assessment / Plan / Recommendation Clinical Impression  This 76 y.o. male admitted with incoordination Lt. hand and mild speech changes.  NIHSS 2. Stroke work up underway. Pt. appears to be close to baseline level of functioning.  He does demonstrate very mild FMC deficit Lt hand, but this does not effect him functionally.  Encouraged him to continue to use it to manipulate objects, he verbalized understanding.  No OT needs identifiied.  Will sign off.    OT Assessment  Patient does not need any further OT services    Follow Up Recommendations  No OT follow up    Barriers to Discharge      Equipment Recommendations  None recommended by OT    Recommendations for Other Services    Frequency       Precautions / Restrictions Precautions Precautions: None Restrictions Weight Bearing Restrictions: No       ADL  Eating/Feeding: Simulated;Independent Where Assessed - Eating/Feeding: Edge of bed Grooming: Performed;Wash/dry hands;Independent Where Assessed - Grooming: Unsupported standing Upper Body Bathing: Simulated;Set up Where Assessed - Upper Body Bathing: Unsupported sitting Lower Body Bathing: Set up;Simulated Where Assessed - Lower Body Bathing: Unsupported sit to stand Upper Body Dressing: Simulated;Set up Where Assessed - Upper Body Dressing: Unsupported sitting Lower Body Dressing: Performed;Set up Where Assessed - Lower Body Dressing: Unsupported sit to stand Toilet Transfer: Performed;Independent Toilet Transfer Method: Sit to Barista: Comfort height toilet Toileting - Clothing Manipulation and Hygiene: Performed;Independent Where Assessed - Toileting Clothing Manipulation and Hygiene: Standing Tub/Shower Transfer: Public house manager Method: Ambulating Transfers/Ambulation Related to ADLs: ambulates in room independently ADL Comments: Pt. able to perform ADLs independently.  Pt. with mild incoordination Lt. hand which is improving.  Pt. is approximating baseline    OT Diagnosis:    OT Problem List:   OT Treatment Interventions:     OT Goals    Visit Information  Last OT Received On: 03/02/12 Assistance Needed: +1    Subjective Data  Subjective: "They think I had a small stroke" Patient Stated Goal: To go home   Prior Functioning  Home Living Lives With: Son Available Help at Discharge: Available PRN/intermittently;Family Type of Home: House Home Access: Level entry Home Layout: One level Bathroom Shower/Tub: Tub/shower unit;Curtain Firefighter: Handicapped height Home Adaptive Equipment: Straight cane Prior Function Level of Independence: Independent Able to Take Stairs?: Yes Driving: No Vocation: Retired Musician: No difficulties (strong accent) Dominant Hand: Right    Cognition  Overall Cognitive Status: Appears within functional limits for tasks assessed/performed Arousal/Alertness: Awake/alert Orientation Level: Appears intact for tasks assessed Behavior During Session: [facility] for tasks performed    Extremity/Trunk Assessment Right Upper Extremity Assessment RUE ROM/Strength/Tone: Within functional levels RUE Sensation: WFL - Light Touch RUE Coordination: WFL - gross/fine motor Left Upper Extremity Assessment LUE ROM/Strength/Tone: Within functional levels LUE Sensation: Deficits LUE Sensation Deficits: Pt. reports tingling dorsum of hand LUE Coordination: Deficits LUE Coordination Deficits: Mild FMC defiict Lt. hand - mildly slower with manipulating objects.  Able to tie shoes independently, fasten buttons, zippers, and translate objects palm to fingertips Trunk Assessment Trunk Assessment: Normal   Mobility Bed Mobility Bed Mobility: Supine to Sit;Sitting  - Scoot to Edge of Bed;Sit to Supine Supine to Sit: 7: Independent;HOB flat Sitting - Scoot to Edge of Bed: 7: Independent Sit to Supine: 7:  Independent;HOB flat Transfers Transfers: Sit to Stand;Stand to Sit Sit to Stand: 7: Independent;With upper extremity assist;From bed Stand to Sit: 7: Independent;To bed   Exercise    Balance Balance Balance Assessed: Yes Dynamic Standing Balance Dynamic Standing - Level of Assistance: 7: Independent Dynamic Standing - Balance Activities:  (ADLs)  End of Session OT - End of Session Activity Tolerance: Patient tolerated treatment well Patient left: in bed;with call bell/phone within reach Nurse Communication: Mobility status   Adonna Horsley, Ursula Alert M 03/02/2012, 10:16 AM

## 2012-03-02 NOTE — Progress Notes (Signed)
  Echocardiogram 2D Echocardiogram has been performed.  Kalicia Dufresne FRANCES 03/02/2012, 11:32 AM

## 2012-03-02 NOTE — Progress Notes (Signed)
*  PRELIMINARY RESULTS* Vascular Ultrasound Carotid Duplex (Doppler) has been completed.   There is no evidence of internal carotid artery stenosis bilaterally. Bilateral antegrade vertebral artery flow.  03/02/2012 1:07 PM Elpidio Galea, RDMS, RDCS

## 2012-03-02 NOTE — Progress Notes (Signed)
Patient [name], 76 year old male, is recovering "from apparent stroke".  He feels positive, and enjoys the emotional support of his family.  Patient thanked Orthoptist for providing pastoral presence, prayer, and conversation.  I will follow-up as needed.

## 2012-03-02 NOTE — ED Provider Notes (Signed)
History     CSN: [id number]  Arrival date & time 03/01/12  2112   First MD Initiated Contact with Patient 03/01/12 2019      Chief Complaint  Patient presents with  . Stroke Symptoms    (Consider location/radiation/quality/duration/timing/severity/associated sxs/prior treatment) The history is provided by the patient.   patient presents as a transfer from [place] long as a code stroke. At around 6 PM he was last normal. He states he went outside to work in the yard and realized he is having trouble using his left hand. He states it trouble holding the string. No headache. He states he also some tingling in left hand. He may have had some mild difficulty speaking. He is also told by his wife at his face was drooping a little bit. He was transferred to the ED after a negative head CT. No previous stroke history. No chest pain. No headache.  Past Medical History  Diagnosis Date  . Renal disorder   . Hypertension     History reviewed. No pertinent past surgical history.  History reviewed. No pertinent family history.  History  Substance Use Topics  . Smoking status: Former Games developer  . Smokeless tobacco: Not on file  . Alcohol Use: Yes      Review of Systems  Constitutional: Negative for appetite change.  HENT: Negative for ear discharge.   Eyes: Negative for pain.  Respiratory: Negative for choking.   Cardiovascular: Negative for chest pain.  Musculoskeletal: Negative for back pain.  Neurological: Positive for speech difficulty and weakness. Negative for syncope, light-headedness and headaches.    Allergies  Review of patient's allergies indicates no known allergies.  Home Medications   Current Outpatient Rx  Name Route Sig Dispense Refill  . AMLODIPINE BESYLATE 2.5 MG PO TABS Oral Take 2.5 mg by mouth daily.    . FENOFIBRATE 160 MG PO TABS Oral Take 160 mg by mouth daily.    Marland Kitchen LISINOPRIL 10 MG PO TABS Oral Take 10 mg by mouth daily.      BP 151/88  Pulse 63   Temp 98.3 F (36.8 C) (Oral)  Resp 20  Ht 5\' 5"  (1.651 m)  Wt 158 lb (71.668 kg)  BMI 26.29 kg/m2  SpO2 98%  Physical Exam  Nursing note and vitals reviewed. Constitutional: He is oriented to person, place, and time. He appears well-developed and well-nourished.  HENT:  Head: Normocephalic and atraumatic.  Eyes: EOM are normal. Pupils are equal, round, and reactive to light.  Neck: Normal range of motion. Neck supple.  Cardiovascular: Normal rate, regular rhythm and normal heart sounds.   No murmur heard. Pulmonary/Chest: Effort normal and breath sounds normal.  Abdominal: Soft. Bowel sounds are normal. He exhibits no distension and no mass. There is no tenderness. There is no rebound and no guarding.  Musculoskeletal: Normal range of motion. He exhibits no edema.  Neurological: He is alert and oriented to person, place, and time. A cranial nerve deficit is present.       Left-sided facial droop. Mild paresthesias to left hand. Mildly decreased strength in left hand. Paresthesias to left hand. Finger-nose intact bilaterally extraocular movements intact. NIH stroke scale done by neurology  and rapid response nurse.  Skin: Skin is warm and dry.  Psychiatric: He has a normal mood and affect.    ED Course  Procedures (including critical care time)  Labs Reviewed  GLUCOSE, CAPILLARY - Abnormal; Notable for the following:    Glucose-Capillary 136 (*)  All other components within normal limits  CBC - Abnormal; Notable for the following:    RBC 4.16 (*)     Hemoglobin 12.3 (*)     HCT 38.1 (*)     All other components within normal limits  COMPREHENSIVE METABOLIC PANEL - Abnormal; Notable for the following:    Glucose, Bld 138 (*)     BUN 26 (*)     Creatinine, Ser 1.49 (*)     GFR calc non Af Amer 45 (*)     GFR calc Af Amer 52 (*)     All other components within normal limits  CK TOTAL AND CKMB - Abnormal; Notable for the following:    Total CK 247 (*)     All other  components within normal limits  POCT I-STAT, CHEM 8 - Abnormal; Notable for the following:    BUN 29 (*)     Creatinine, Ser 1.60 (*)     Glucose, Bld 133 (*)     All other components within normal limits  PROTIME-INR  APTT  DIFFERENTIAL  TROPONIN I  URINE RAPID DRUG SCREEN (HOSP PERFORMED)   Ct Head Wo Contrast  03/01/2012  *RADIOLOGY REPORT*  Clinical Data: 76 year old male with left arm pain.  Stroke symptoms.  CT HEAD WITHOUT CONTRAST  Technique:  Contiguous axial images were obtained from the base of the skull through the vertex without contrast.  Comparison: None.  Findings: Sclerosis of the left mastoids probably is chronic. There may be an anterior ethmoid mucocele on the right.  Other paranasal sinuses and mastoids are clear.  No acute osseous abnormality identified.  Visualized orbits and scalp soft tissues are within normal limits.  No ventriculomegaly. No midline shift, mass effect, or evidence of mass lesion.  No acute intracranial hemorrhage identified.  No evidence of cortically based acute infarction identified.  Normal gray-white matter differentiation throughout the brain.  Somewhat conspicuous left ICA terminus and left MCA posterior sylvian division, but similar to other intracranial vascular density.  Overall no suspicious intracranial vascular hyperdensity.  IMPRESSION: Noncontrast CT appearance of the brain is within normal limits for age.  Original Report Authenticated By: Harley Hallmark, M.D.     1. Stroke-like symptoms       MDM  Patient with strokelike symptoms. Negative head CT. He has some continued deficits. He is not a TPA candidate due to NIH score. Patient be admitted to medicine for further workup and evaluation. She's been seen in the ER by neurology.        Juliet Rude. Rubin Payor, MD 03/02/12 Jacinta Shoe

## 2012-03-02 NOTE — Evaluation (Signed)
Physical Therapy Evaluation Patient Details Name: [name] MRN: [id number] DOB: May 04, 1936 Today's Date: 03/02/2012 Time: 4098-1191 PT Time Calculation (min): 26 min  PT Assessment / Plan / Recommendation Clinical Impression  Pt is 76 y/o male admitted for left sided upper extremity weaknees and numbness/tingling.  Pt moving independently with all mobility and scored 23/24 on DGI.  Pt has no further PT needs.  PT will sign off.    PT Assessment  Patent does not need any further PT services    Follow Up Recommendations  No PT follow up    Barriers to Discharge        lEquipment Recommendations  None recommended by OT;None recommended by PT    Recommendations for Other Services     Frequency      Precautions / Restrictions Precautions Precautions: None Restrictions Weight Bearing Restrictions: No   Pertinent Vitals/Pain No c/o pain      Mobility  Bed Mobility Bed Mobility: Supine to Sit;Sitting - Scoot to Edge of Bed;Sit to Supine Supine to Sit: 7: Independent;HOB flat Sitting - Scoot to Edge of Bed: 7: Independent Sit to Supine: 7: Independent;HOB flat Transfers Sit to Stand: 7: Independent;With upper extremity assist;From bed Stand to Sit: 7: Independent;To bed Ambulation/Gait Ambulation/Gait Assistance: 7: Independent Ambulation Distance (Feet): 300 Feet Assistive device: None Gait Pattern: Within Functional Limits Stairs: Yes Stairs Assistance: 4: Min guard Stair Management Technique: No rails Number of Stairs: 5  Modified Rankin (Stroke Patients Only) Pre-Morbid Rankin Score: No symptoms Modified Rankin: No significant disability    Exercises     PT Diagnosis:    PT Problem List:   PT Treatment Interventions:     PT Goals    Visit Information  Last PT Received On: 03/02/12 Assistance Needed: +1    Subjective Data  Subjective: "I'm doing better my hand is still a little numb." Patient Stated Goal: To go home   Prior Functioning  Home Living Lives With: Son Available Help at Discharge: Available PRN/intermittently;Family Type of Home: House Home Access: Level entry Home Layout: One level Bathroom Shower/Tub: Tub/shower unit;Curtain Firefighter: Handicapped height Home Adaptive Equipment: Straight cane Prior Function Level of Independence: Independent Able to Take Stairs?: Yes Driving: No Vocation: Retired Musician: No difficulties (strong accent) Dominant Hand: Right    Cognition  Overall Cognitive Status: Appears within functional limits for tasks assessed/performed Arousal/Alertness: Awake/alert Orientation Level: Appears intact for tasks assessed Behavior During Session: [facility] for tasks performed    Extremity/Trunk Assessment Right Upper Extremity Assessment RUE ROM/Strength/Tone: Within functional levels RUE Sensation: WFL - Light Touch RUE Coordination: WFL - gross/fine motor Left Upper Extremity Assessment LUE ROM/Strength/Tone: Within functional levels LUE Sensation: Deficits LUE Sensation Deficits: Pt. reports tingling dorsum of hand LUE Coordination: Deficits LUE Coordination Deficits: Mild FMC defiict Lt. hand - mildly slower with manipulating objects.  Able to tie shoes independently, fasten buttons, zippers, and translate objects palm to fingertips Right Lower Extremity Assessment RLE ROM/Strength/Tone: Within functional levels RLE Sensation: WFL - Light Touch RLE Coordination: WFL - gross/fine motor Left Lower Extremity Assessment LLE ROM/Strength/Tone: Within functional levels LLE Sensation: WFL - Light Touch LLE Coordination: WFL - gross/fine motor Trunk Assessment Trunk Assessment: Normal   Balance Balance Balance Assessed: Yes Dynamic Standing Balance Dynamic Standing - Level of Assistance: 7: Independent Dynamic Standing - Balance Activities:  (ADLs) Dynamic Gait Index Level Surface: Normal Change in Gait Speed: Normal Gait with Horizontal Head Turns:  Normal Gait with Vertical Head Turns: Normal Gait and  Pivot Turn: Normal Step Over Obstacle: Normal Step Around Obstacles: Normal Steps: Mild Impairment Total Score: 23   End of Session PT - End of Session Equipment Utilized During Treatment: Gait belt Activity Tolerance: Patient tolerated treatment well Patient left: in bed;with call bell/phone within reach Nurse Communication: Mobility status   Keirstin Musil 03/02/2012, 2:00 PM Jake Shark, PT DPT [phone]

## 2012-03-02 NOTE — Progress Notes (Signed)
Pt off unit for neuro checks at 1020,

## 2012-03-02 NOTE — Progress Notes (Signed)
PCP: Lorenda Peck, MD  Brief HPI:  76 year old male with history of hypertension and hyperlipidemia and was recently found to have mild hyperglycemia by the PCP. The evening of admission, while in his garden, he suddenly felt weak in his left hand and decreased grip strength. Family who was by the side also noticed some left-sided facial droop and slurred speech. Patient was brought to the ER. CT of the head was negative for anything acute. Neurologist Dr. Roseanne Reno had assessed the patient and felt the patient was not a candidate for TPA due to minimal deficits. Patient was then admitted for further management. Patient denied any difficulty swallowing or did not have any visual symptoms. Denied any headache or loss of consciousness.   Past medical history:  Past Medical History  Diagnosis Date  . Renal disorder   . Hypertension     Consultants: Neurology  Procedures: None  Subjective: Patient feels his strength is improved in left arm. Did not have weakness in left leg. Speech is some better.   Objective: Vital signs in last 24 hours: Temp:  [97.7 F (36.5 C)-98.3 F (36.8 C)] 98.1 F (36.7 C) (06/20 1001) Pulse Rate:  [51-74] 59  (06/20 1001) Resp:  [13-21] 18  (06/20 1001) BP: (104-176)/(51-97) 148/76 mmHg (06/20 1001) SpO2:  [95 %-100 %] 98 % (06/20 1001) Weight:  [71.668 kg (158 lb)] 71.668 kg (158 lb) (06/19 2122) Weight change:  Last BM Date: 03/01/12  Intake/Output from previous day: 06/19 0701 - 06/20 0700 In: 530 [P.O.:240; I.V.:290] Out: -  Intake/Output this shift:    General appearance: alert, cooperative, appears stated age and no distress Head: Normocephalic, without obvious abnormality, atraumatic Eyes: conjunctivae/corneas clear. PERRL, EOM's intact. Throat: lips, mucosa, and tongue normal; teeth and gums normal Neck: no adenopathy, no carotid bruit, no JVD, supple, symmetrical, trachea midline and thyroid not enlarged, symmetric, no  tenderness/mass/nodules Resp: clear to auscultation bilaterally Cardio: regular rate and rhythm, S1, S2 normal, no murmur, click, rub or gallop GI: soft, non-tender; bowel sounds normal; no masses,  no organomegaly Extremities: extremities normal, atraumatic, no cyanosis or edema Skin: Skin color, texture, turgor normal. No rashes or lesions Lymph nodes: Cervical, supraclavicular, and axillary nodes normal. Neurologic: Alert and oriented x 3. No facial asymmetry noted. Mild weakness with hand grip on left.  Lab Results:  Basename 03/02/12 0610 03/01/12 2051 03/01/12 2040  WBC 4.6 -- 5.8  HGB 12.0* 13.3 --  HCT 36.8* 39.0 --  PLT 144* -- 159   BMET  Basename 03/02/12 0610 03/01/12 2051 03/01/12 2040  NA 142 144 --  K 3.2* 3.8 --  CL 108 107 --  CO2 25 -- 25  GLUCOSE 139* 133* --  BUN 26* 29* --  CREATININE 1.22 1.60* --  CALCIUM 9.0 -- 9.7  ALT 9 -- 12    Studies/Results: Ct Head Wo Contrast  03/01/2012  *RADIOLOGY REPORT*  Clinical Data: 76 year old male with left arm pain.  Stroke symptoms.  CT HEAD WITHOUT CONTRAST  Technique:  Contiguous axial images were obtained from the base of the skull through the vertex without contrast.  Comparison: None.  Findings: Sclerosis of the left mastoids probably is chronic. There may be an anterior ethmoid mucocele on the right.  Other paranasal sinuses and mastoids are clear.  No acute osseous abnormality identified.  Visualized orbits and scalp soft tissues are within normal limits.  No ventriculomegaly. No midline shift, mass effect, or evidence of mass lesion.  No acute intracranial hemorrhage  identified.  No evidence of cortically based acute infarction identified.  Normal gray-white matter differentiation throughout the brain.  Somewhat conspicuous left ICA terminus and left MCA posterior sylvian division, but similar to other intracranial vascular density.  Overall no suspicious intracranial vascular hyperdensity.  IMPRESSION: Noncontrast  CT appearance of the brain is within normal limits for age.  Original Report Authenticated By: Harley Hallmark, M.D.    Medications:  Scheduled:   . amLODipine  2.5 mg Oral Daily  . aspirin  324 mg Oral Once  . aspirin  300 mg Rectal Daily   Or  . aspirin  325 mg Oral Daily  . enoxaparin  40 mg Subcutaneous Q24H  . fenofibrate  160 mg Oral Daily  . lisinopril  10 mg Oral Daily   Continuous:   . sodium chloride 100 mL/hr at 03/02/12 0306   [id number]  Assessment/Plan:  Principal Problem:  *CVA (cerebral infarction) Active Problems:  HTN (hypertension)  Renal insufficiency  Hyperlipidemia    Acute CVA Patient feels strength has improved. Await stroke work up to be completed. Continue Aspirin. Neurology is following.   Hypertension Monitor BP closely. Continue current medications.   Hyperlipidemia Continue present medication. Check lipid panel.   Renal insufficiency probably chronic Creatinine better today. Monitor. May have to hold lisinopril if creatinine worsens.   Mild normocytic normochromic anemia  May be from chronic kidney disease. Anemia panel is pending. Follow CBC. Patient states he had a colonoscopy 3 months ago which was normal.   Hypokalemia Replete  Code Status Full Code  DVT Prophylaxis Enoxaparin  Disposition Home when stroke work up completed. Possible discharge in AM.    LOS: 1 day   Bergan [facility]  [facility] Pager [phone] 03/02/2012, 10:46 AM

## 2012-03-02 NOTE — Evaluation (Signed)
Speech Language Pathology Evaluation Patient Details Name: [name] MRN: [id number] DOB: Jul 17, 1936 Today's Date: 03/02/2012 Time: 5284-1324 SLP Time Calculation (min): 13 min  Problem List:  Patient Active Problem List  Diagnosis  . CVA (cerebral infarction)  . HTN (hypertension)  . Renal insufficiency  . Hyperlipidemia   Past Medical History:  Past Medical History  Diagnosis Date  . Renal disorder   . Hypertension    Past Surgical History: History reviewed. No pertinent past surgical history. HPI:  76 yr old working in his garden experienced sudden left hand weakness.  CT negative with MRI pending.     Assessment / Plan / Recommendation Clinical Impression  Pt.'s speech-language-cognition judged to be WFL's for items assessed.  Pt. is foreign and has an accent which somewhat limits intelligibility versus true dysarthria.  Mild-moderately decreased labial weakness and decreased ROM on left.  Demonstrated exercises to increase labial strength and ROM.  No further ST needed.    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)   Cognition  Overall Cognitive Status: Appears within functional limits for tasks assessed Orientation Level: Oriented X4    Comprehension  Auditory Comprehension Overall Auditory Comprehension: Appears within functional limits for tasks assessed Reading Comprehension Reading Status: Not tested    Expression Expression Primary Mode of Expression: Verbal Verbal Expression Overall Verbal Expression: Appears within functional limits for tasks assessed Pragmatics: No impairment Written Expression Written Expression: Not  tested   Oral / Motor Oral Motor/Sensory Function Overall Oral Motor/Sensory Function: Impaired Labial ROM: Reduced left Labial Symmetry: Abnormal symmetry left Labial Strength: Reduced Lingual ROM: Within Functional Limits Lingual Symmetry: Within Functional Limits Lingual Strength: Within Functional Limits Facial ROM: Reduced left Mandible: Within Functional Limits Motor Speech Overall Motor Speech: Appears within functional limits for tasks assessed Intelligibility: Intelligible Motor Planning: Witnin functional limits     Breck Coons [employer].Ed [employer] [phone]  03/02/2012

## 2012-03-02 NOTE — Progress Notes (Signed)
Stroke Team Progress Note  HISTORY [name] is an 76 y.o. male history of hypertension and hyperlipidemia experiencing onset of weakness involving his left hand while gardening at about 6 PM today. Family noted that he had left lower facial weakness and speech was slightly slurred. There is no previous history of stroke nor TIA. He has not been on antiplatelet therapy. CT scan of his head showed no acute intracranial abnormality. NIH stroke score was 2. Patient was not considered a candidate for thrombolytic therapy with TPA because of mild deficits only. He was admitted for further evaluation and treatment.  SUBJECTIVE No family is at the bedside.  Overall he feels his condition is stable. He still cannot use his left hand very well and his speech remains slurred.  OBJECTIVE Most recent Vital Signs: Filed Vitals:   03/02/12 0111 03/02/12 0225 03/02/12 0430 03/02/12 0630  BP: 104/51 155/91 114/67 114/72  Pulse: 61 58 60 51  Temp:  97.7 F (36.5 C) 97.7 F (36.5 C) 97.8 F (36.6 C)  TempSrc:      Resp: 16 16 16 16   Height:      Weight:      SpO2: 99% 95% 99% 98%   CBG (last 3)   Basename 03/01/12 2016  GLUCAP 136*   Intake/Output from previous day: 06/19 0701 - 06/20 0700 In: 530 [P.O.:240; I.V.:290] Out: -   IV Fluid Intake:     . sodium chloride 100 mL/hr at 03/02/12 0306   MEDICATIONS    . amLODipine  2.5 mg Oral Daily  . aspirin  324 mg Oral Once  . aspirin  300 mg Rectal Daily   Or  . aspirin  325 mg Oral Daily  . enoxaparin  40 mg Subcutaneous Q24H  . fenofibrate  160 mg Oral Daily  . lisinopril  10 mg Oral Daily   PRN:  senna-docusate  Diet:  Cardiac thin liquids Activity:  OOB in chair  DVT Prophylaxis:  Lovenox 40 mg sq daily   CLINICALLY SIGNIFICANT STUDIES Basic Metabolic Panel:  Lab 03/02/12 1610 03/01/12 2051 03/01/12 2040  NA 142 144 --  K 3.2* 3.8 --  CL 108 107 --  CO2 25 -- 25  GLUCOSE 139* 133* --  BUN 26* 29* --  CREATININE  1.22 1.60* --  CALCIUM 9.0 -- 9.7  MG -- -- --  PHOS -- -- --   Liver Function Tests:  Lab 03/02/12 0610 03/01/12 2040  AST 13 17  ALT 9 12  ALKPHOS 39 50  BILITOT 0.4 0.4  PROT 6.3 7.3  ALBUMIN 3.5 3.9   CBC:  Lab 03/02/12 0610 03/01/12 2051 03/01/12 2040  WBC 4.6 -- 5.8  NEUTROABS -- -- 3.8  HGB 12.0* 13.3 --  HCT 36.8* 39.0 --  MCV 90.2 -- 91.6  PLT 144* -- 159   Coagulation:  Lab 03/01/12 2040  LABPROT 13.9  INR 1.05   Cardiac Enzymes:  Lab 03/01/12 2040  CKTOTAL 247*  CKMB 3.3  CKMBINDEX --  TROPONINI <0.30   Urinalysis:  Lab 03/02/12 0521  COLORURINE YELLOW  LABSPEC 1.019  PHURINE 6.0  GLUCOSEU NEGATIVE  HGBUR NEGATIVE  BILIRUBINUR NEGATIVE  KETONESUR NEGATIVE  PROTEINUR NEGATIVE  UROBILINOGEN 1.0  NITRITE NEGATIVE  LEUKOCYTESUR NEGATIVE   Lipid Panel    Component Value Date/Time   CHOL 147 03/02/2012 0610   TRIG 98 03/02/2012 0610   HDL 33* 03/02/2012 0610   CHOLHDL 4.5 03/02/2012 0610   VLDL 20 03/02/2012 0610  LDLCALC 94 03/02/2012 0610   HgbA1C  No results found for this basename: HGBA1C    Urine Drug Screen:     Component Value Date/Time   LABOPIA NONE DETECTED 03/01/2012 2341   COCAINSCRNUR NONE DETECTED 03/01/2012 2341   LABBENZ NONE DETECTED 03/01/2012 2341   AMPHETMU NONE DETECTED 03/01/2012 2341   THCU NONE DETECTED 03/01/2012 2341   LABBARB NONE DETECTED 03/01/2012 2341    Alcohol Level: No results found for this basename: ETH:2 in the last 168 hours  CT of the brain  03/01/2012 Noncontrast CT appearance of the brain is within normal limits for age.   MRI of the brain    MRA of the brain    2D Echocardiogram    Carotid Doppler    CXR    EKG  normal sinus rhythm.   Therapy Recommendations PT -, OT -  Physical Exam  Middle aged [place] male not in distress.Awake alert. Afebrile. Head is nontraumatic. Neck is supple without bruit. Hearing is normal. Cardiac exam no murmur or gallop. Lungs are clear to auscultation. Distal  pulses are well felt.  Neurological Exam : Awake alert oriented x 3 normal language but mild dysarthria..Eye movements full. Normal visual acuity and fields. Mild left lower face asymmetry. Tongue midline. No drift. Mild diminished fine finger movements on left. Orbits right over left upper extremity. Mild left grip weak.. Normal sensation . Normal coordination. Gait deferred. ASSESSMENT Mr. [name] is a 76 y.o. male with a right brain subcortical infarct,  secondary to unknown etiology. On no antiplatelets prior to admission (stopped taking low dose aspirin over 1.5 years ago). Now on aspirin 325 mg orally every day for secondary stroke prevention. Patient with resultant left hemiparesis and slurred speech.  -hypertension -hyperlipidemia, LDL 94 -renal insufficieny -cigarette smoker, quit 3 years ago  Hospital day # 1  TREATMENT/PLAN -Continue aspirin 325 mg orally every day for secondary stroke prevention. -complete stroke work up [employer] and .OT  Joaquin Music, ANP-BC, GNP-BC [facility] Pager: [phone] 03/02/2012 9:14 AM  Dr. Delia Heady, Stroke Center Medical Director, has personally reviewed chart, pertinent data, examined the patient and developed the plan of care. Pager:  [phone]

## 2012-03-03 DIAGNOSIS — R112 Nausea with vomiting, unspecified: Secondary | ICD-10-CM

## 2012-03-03 DIAGNOSIS — I6340 Cerebral infarction due to embolism of unspecified cerebral artery: Secondary | ICD-10-CM

## 2012-03-03 DIAGNOSIS — I10 Essential (primary) hypertension: Secondary | ICD-10-CM

## 2012-03-03 LAB — CBC
HCT: 37.5 % — ABNORMAL LOW (ref 39.0–52.0)
Hemoglobin: 12.5 g/dL — ABNORMAL LOW (ref 13.0–17.0)
MCHC: 33.3 g/dL (ref 30.0–36.0)
RBC: 4.2 MIL/uL — ABNORMAL LOW (ref 4.22–5.81)

## 2012-03-03 LAB — BASIC METABOLIC PANEL
BUN: 18 mg/dL (ref 6–23)
CO2: 24 mEq/L (ref 19–32)
GFR calc non Af Amer: 64 mL/min — ABNORMAL LOW (ref >90)
Glucose, Bld: 104 mg/dL — ABNORMAL HIGH (ref 70–99)
Potassium: 4.1 mEq/L (ref 3.5–5.1)

## 2012-03-03 MED ORDER — ASPIRIN 325 MG PO TABS: 325.0000 mg | ORAL_TABLET | Freq: Every day | ORAL | Status: AC

## 2012-03-03 NOTE — Discharge Instructions (Signed)
STROKE/TIA DISCHARGE INSTRUCTIONS SMOKING Cigarette smoking nearly doubles your risk of having a stroke & is the single most alterable risk factor  If you smoke or have smoked in the last 12 months, you are advised to quit smoking for your health.  Most of the excess cardiovascular risk related to smoking disappears within a year of stopping.  Ask you doctor about anti-smoking medications  [facility] Quit Line: 1-800-QUIT NOW  Free Smoking Cessation Classes ([phone]  CHOLESTEROL Know your levels; limit fat & cholesterol in your diet  Lipid Panel  No results found for this basename: chol, trig, hdl, cholhdl, vldl, ldlcalc      Many patients benefit from treatment even if their cholesterol is at goal.  Goal: Total Cholesterol (CHOL) less than 160  Goal:  Triglycerides (TRIG) less than 150  Goal:  HDL greater than 40  Goal:  LDL (LDLCALC) less than 100   BLOOD PRESSURE American Stroke Association blood pressure target is less that 120/80 mm/Hg  Your discharge blood pressure is:  BP: 114/72 mmHg  Monitor your blood pressure  Limit your salt and alcohol intake  Many individuals will require more than one medication for high blood pressure  DIABETES (A1c is a blood sugar average for last 3 months) Goal HGBA1c is under 7% (HBGA1c is blood sugar average for last 3 months)  Diabetes: {STROKE DC DIABETES:22357}    No results found for this basename: HGBA1C     Your HGBA1c can be lowered with medications, healthy diet, and exercise.  Check your blood sugar as directed by your physician  Call your physician if you experience unexplained or low blood sugars.  PHYSICAL ACTIVITY/REHABILITATION Goal is 30 minutes at least 4 days per week    {STROKE DC ACTIVITY/REHAB:22359}  Activity decreases your risk of heart attack and stroke and makes your heart stronger.  It helps control your weight and blood pressure; helps you relax and can improve your mood.  Participate in a regular exercise  program.  Talk with your doctor about the best form of exercise for you (dancing, walking, swimming, cycling).  DIET/WEIGHT Goal is to maintain a healthy weight  Your discharge diet is: Cardiac *** liquids Your height is:  Height: 5\' 5"  (165.1 cm) Your current weight is: Weight: 71.668 kg (158 lb) Your Body Mass Index (BMI) is:  BMI (Calculated): 26.3   Following the type of diet specifically designed for you will help prevent another stroke.  Your goal weight range is:  ***  Your goal Body Mass Index (BMI) is 19-24.  Healthy food habits can help reduce 3 risk factors for stroke:  High cholesterol, hypertension, and excess weight.  RESOURCES Stroke/Support Group:  Call [phone]  they meet the 3rd Sunday of the month on the Rehab Unit at [facility], [place] ( no meetings June, July & Aug).  STROKE EDUCATION PROVIDED/REVIEWED AND GIVEN TO PATIENT Stroke warning signs and symptoms How to activate emergency medical system (call 911). Medications prescribed at discharge. Need for follow-up after discharge. Personal risk factors for stroke. Pneumonia vaccine given:   {STROKE DC YES/NO/DATE:22363} Flu vaccine given:   {STROKE DC YES/NO/DATE:22363} My questions have been answered, the writing is legible, and I understand these instructions.  I will adhere to these goals & educational materials that have been provided to me after my discharge from the hospital.

## 2012-03-03 NOTE — Discharge Summary (Addendum)
Physician Discharge Summary  Patient ID: [name] MRN: [id number] DOB/AGE: 74/07/1936 74 y.o.  Admit date: 03/01/2012 Discharge date: 03/03/2012  PCP: Lorenda Peck, MD  DISCHARGE DIAGNOSES:  Principal Problem:  *CVA (cerebral infarction) Active Problems:  HTN (hypertension)  Renal insufficiency  Hyperlipidemia  Hypokalemia  Normocytic anemia   RECOMMENDATIONS TO PCP 1. Assess if patient needs to be on statin 2. Neurology will set up TEE as outpatient. Please follow up on this.   DISCHARGE CONDITION: fair  INITIAL HISTORY: 76 year old male with history of hypertension and hyperlipidemia and was recently found to have mild hyperglycemia by the PCP. The evening of admission, while in his garden, he suddenly felt weak in his left hand and decreased grip strength. Family who was by the side also noticed some left-sided facial droop and slurred speech. Patient was brought to the ER. CT of the head was negative for anything acute. Neurologist Dr. Roseanne Reno had assessed the patient and felt the patient was not a candidate for TPA due to minimal deficits. Patient was then admitted for further management. Patient denied any difficulty swallowing or did not have any visual symptoms. Denied any headache or loss of consciousness.    HOSPITAL COURSE:   Acute CVA with infarct in the posterior right frontal lobe and portion of the right parietal lobe The patient had the slurred speech along with weakness in the left upper extremity. The symptoms have improved. His weakness has almost resolved. His speech resolved back to normal. Patient was seen by neurology. He underwent stroke workup. He was started on aspirin every day. Echocardiogram showed mildly reduced systolic function. No embolic source was noted, however, neurology, is recommending a transesophageal echocardiogram, but this can be done as an outpatient. They will schedule this for [place] based on my conversation with Dr.  Anne Hahn. Continue with current lipid-lowering agent. PCP to consider switching to a statin medication.  Hypertension  Blood Pressure has remained stable. Continue with current home medications. Monitor BP closely. Continue current medications.   Hyperlipidemia  LDL was 94. Triglycerides 98. Total cholesterol 147 HDL was 33. Patient to continue with current medication. PCP to consider statin.  Renal insufficiency probably chronic  Creat improved. Blood work is pending this morning.  Mildly reduced systolic function His EF is 4550%. He is well compensated. Defer further management of this issue to his primary care physician. Patient tells me that he is scheduled for a stress test next week. I have asked the patient to discuss his hospital stay and report of echocardiogram with his primary care physician before undergoing the stress test.  Mild normocytic normochromic anemia  May be from chronic kidney disease. Anemia panel did not suggest any deficiencies. Patient states he had a colonoscopy 3 months ago which was normal.   Hypokalemia  Was repleted  PERTINENT LABS: Initial BUN was 26 creatinine was 1.49. Potassium was 3.2. HbA1c was 6.4. Urine drug screen was unremarkable   IMAGING STUDIES Dg Chest 2 View  03/02/2012  *RADIOLOGY REPORT*  Clinical Data: Former smoker with history of hypertension, hypercholesterolemia and stroke  CHEST - 2 VIEW  Comparison: 01/31/2012  Findings: Mild cardiac enlargement with aortic ectasia is stable. The lung fields are again notable for a nodular density overlying the lower third of the left lung, well circumscribed around 2/3 of the border and most likely representing a nipple shadow.  Repeat PA view with nipple markers is recommended to confirm this impression. No other focal parenchymal abnormalities are suggested.  No  pleural fluid is seen.  Bony structures demonstrate degenerative change of the mid and lower thoracic and upper lumbar spine.  IMPRESSION:  Stable cardiopulmonary appearance with probable nipple shadow overlying the lower left lung field.  Recommend repeat PA view with nipple markers to confirm this impression and exclude the possibility of an intraparenchymal nodule.  Stable cardiomegaly and aortic ectasia.  Original Report Authenticated By: Bertha Stakes, M.D.   Ct Head Wo Contrast  03/01/2012  *RADIOLOGY REPORT*  Clinical Data: 76 year old male with left arm pain.  Stroke symptoms.  CT HEAD WITHOUT CONTRAST  Technique:  Contiguous axial images were obtained from the base of the skull through the vertex without contrast.  Comparison: None.  Findings: Sclerosis of the left mastoids probably is chronic. There may be an anterior ethmoid mucocele on the right.  Other paranasal sinuses and mastoids are clear.  No acute osseous abnormality identified.  Visualized orbits and scalp soft tissues are within normal limits.  No ventriculomegaly. No midline shift, mass effect, or evidence of mass lesion.  No acute intracranial hemorrhage identified.  No evidence of cortically based acute infarction identified.  Normal gray-white matter differentiation throughout the brain.  Somewhat conspicuous left ICA terminus and left MCA posterior sylvian division, but similar to other intracranial vascular density.  Overall no suspicious intracranial vascular hyperdensity.  IMPRESSION: Noncontrast CT appearance of the brain is within normal limits for age.  Original Report Authenticated By: Harley Hallmark, M.D.   Mr Brain Wo Contrast and MRA  03/02/2012  *RADIOLOGY REPORT*  Clinical Data:  Slurred speech.  Left arm weakness.  MRI HEAD WITHOUT CONTRAST MRA HEAD WITHOUT CONTRAST  Technique: Multiplanar, multiecho pulse sequences of the brain and surrounding structures were obtained according to standard protocol without intravenous contrast.  Angiographic images of the head were obtained using MRA technique without contrast.  Comparison: 03/01/2012 head CT.  No  comparison MR.  MRI HEAD  Findings:  Acute non hemorrhagic infarct posterior right frontal lobe and portion of the right parietal lobe.  No intracranial hemorrhage.  Mild to moderate small vessel disease type changes.  Global atrophy without hydrocephalus.  No intracranial mass lesion detected on this unenhanced exam.  Transverse ligament hypertrophy with mild spinal stenosis.  Partially empty sella incidentally noted.  Minimal to mild paranasal sinus mucosal thickening.  Mild exophthalmos.  IMPRESSION: Acute non hemorrhagic infarct posterior right frontal lobe and portion of the right parietal lobe.  Please see above.  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  Tortuous distal vertical cervical segment of the right internal carotid artery.  Middle cerebral artery mild branch vessel irregularity.  Decreased number of visualized right middle cerebral artery branches consistent with the patient's acute infarct.  Right vertebral artery is dominant.  Mild narrowing and irregularity of the left vertebral artery.  Mild narrowing and  irregularity of the poorly delineated right PICA.  No significant stenosis of the basilar artery.  Mild irregularity superior cerebellar arteries.  Mild irregularity and posterior cerebral artery distal branches.  No obvious aneurysm noted on this slightly motion degraded exam.  IMPRESSION: Intracranial atherosclerotic type changes as noted above.  This has been made a PRA call report utilizing dashboard call feature.  Original Report Authenticated By: Fuller Canada, M.D.   2D ECHOCARDIOGRAM Study Conclusions  - Left ventricle: The cavity size was normal. Systolic function was mildly reduced. The estimated ejection fraction was in the range of 45% to 50%. Wall motion was  normal; there were no regional wall motion abnormalities. Doppler parameters are consistent with abnormal left ventricular relaxation (grade 1 diastolic dysfunction). - Aortic  valve: Mild regurgitation. - Aortic root: The aortic root was mildly dilated. - Left atrium: The atrium was mildly dilated. - Pulmonary arteries: Systolic pressure was mildly increased. PA peak pressure: 32mm Hg (S). Impressions: - No cardiac source of emboli was indentified.    DISCHARGE EXAMINATION: Blood pressure 120/66, pulse 57, temperature 97.9 F (36.6 C), temperature source Oral, resp. rate 18, height 5\' 5"  (1.651 m), weight 71.668 kg (158 lb), SpO2 99.00%. General appearance: alert, cooperative and no distress Resp: clear to auscultation bilaterally Cardio: regular rate and rhythm, S1, S2 normal, no murmur, click, rub or gallop GI: soft, non-tender; bowel sounds normal; no masses,  no organomegaly Neurologic: Alert and oriented X 3, normal strength and tone. Normal symmetric reflexes. Normal coordination and gait  DISPOSITION: Home   Current Discharge Medication List    START taking these medications   Details  aspirin 325 MG tablet Take 1 tablet (325 mg total) by mouth daily. Qty: 30 tablet, Refills: 2      CONTINUE these medications which have NOT CHANGED   Details  amLODipine (NORVASC) 2.5 MG tablet Take 2.5 mg by mouth daily.    fenofibrate 160 MG tablet Take 160 mg by mouth daily.    lisinopril (PRINIVIL,ZESTRIL) 10 MG tablet Take 10 mg by mouth daily.       Follow-up Information    Follow up with ROBERTS, Vernie Ammons, MD. (post hospitalization follow up)    Contact information:   [address] [place] [address] [phone]       Follow up with Gates Rigg, MD. Schedule an appointment as soon as possible for a visit in 1 month. (stroke follow up)    Contact information:   [address] [facility] [place] [address] [phone]       Follow up with TEE (Transesophageal Echocardiogram). (You will get a call with a date and a time for this test to be done as outpatient)           TOTAL DISCHARGE TIME: 35 mins  [facility]  [facility] Pager [phone]  03/03/2012, 9:49 AM

## 2012-03-03 NOTE — Progress Notes (Signed)
Stroke Team Progress Note  HISTORY [name] is an 76 y.o. male history of hypertension and hyperlipidemia experiencing onset of weakness involving his left hand while gardening at about 6 PM today. Family noted that he had left lower facial weakness and speech was slightly slurred. There is no previous history of stroke nor TIA. He has not been on antiplatelet therapy. CT scan of his head showed no acute intracranial abnormality. NIH stroke score was 2. Patient was not considered a candidate for thrombolytic therapy with TPA because of mild deficits only. He was admitted for further evaluation and treatment.  SUBJECTIVE No family is at the bedside.  Overall he feels his condition is stable. He still cannot use his left hand very well and his speech remains slurred.  OBJECTIVE Most recent Vital Signs: Filed Vitals:   03/02/12 1846 03/02/12 2204 03/03/12 0200 03/03/12 0600  BP: 132/74 136/78 123/61 120/66  Pulse: 56 66 71 57  Temp: 98.1 F (36.7 C) 98.1 F (36.7 C) 98.3 F (36.8 C) 97.9 F (36.6 C)  TempSrc: Oral     Resp: 20 18 18 18   Height:      Weight:      SpO2: 98% 96% 100% 99%   CBG (last 3)   Basename 03/01/12 2016  GLUCAP 136*   Intake/Output from previous day: 06/20 0701 - 06/21 0700 In: 1500 [I.V.:1500] Out: -   IV Fluid Intake:      . sodium chloride 100 mL/hr at 03/02/12 0306   MEDICATIONS     . amLODipine  2.5 mg Oral Daily  . aspirin  300 mg Rectal Daily   Or  . aspirin  325 mg Oral Daily  . enoxaparin  40 mg Subcutaneous Q24H  . fenofibrate  160 mg Oral Daily  . lisinopril  10 mg Oral Daily  . potassium chloride  40 mEq Oral Once   PRN:  senna-docusate  Diet:  Cardiac thin liquids Activity:  OOB in chair  DVT Prophylaxis:  Lovenox 40 mg sq daily   CLINICALLY SIGNIFICANT STUDIES Basic Metabolic Panel:   Lab 03/02/12 0610 03/01/12 2051 03/01/12 2040  NA 142 144 --  K 3.2* 3.8 --  CL 108 107 --  CO2 25 -- 25  GLUCOSE 139* 133* --    BUN 26* 29* --  CREATININE 1.22 1.60* --  CALCIUM 9.0 -- 9.7  MG -- -- --  PHOS -- -- --   Liver Function Tests:   Lab 03/02/12 0610 03/01/12 2040  AST 13 17  ALT 9 12  ALKPHOS 39 50  BILITOT 0.4 0.4  PROT 6.3 7.3  ALBUMIN 3.5 3.9   CBC:   Lab 03/02/12 0610 03/01/12 2051 03/01/12 2040  WBC 4.6 -- 5.8  NEUTROABS -- -- 3.8  HGB 12.0* 13.3 --  HCT 36.8* 39.0 --  MCV 90.2 -- 91.6  PLT 144* -- 159   Coagulation:   Lab 03/01/12 2040  LABPROT 13.9  INR 1.05   Cardiac Enzymes:   Lab 03/01/12 2040  CKTOTAL 247*  CKMB 3.3  CKMBINDEX --  TROPONINI <0.30   Urinalysis:   Lab 03/02/12 0521  COLORURINE YELLOW  LABSPEC 1.019  PHURINE 6.0  GLUCOSEU NEGATIVE  HGBUR NEGATIVE  BILIRUBINUR NEGATIVE  KETONESUR NEGATIVE  PROTEINUR NEGATIVE  UROBILINOGEN 1.0  NITRITE NEGATIVE  LEUKOCYTESUR NEGATIVE   Lipid Panel    Component Value Date/Time   CHOL 147 03/02/2012 0610   TRIG 98 03/02/2012 0610   HDL 33* 03/02/2012 0610  CHOLHDL 4.5 03/02/2012 0610   VLDL 20 03/02/2012 0610   LDLCALC 94 03/02/2012 0610   HgbA1C  Lab Results  Component Value Date   HGBA1C 6.4* 03/02/2012    Urine Drug Screen:     Component Value Date/Time   LABOPIA NONE DETECTED 03/01/2012 2341   COCAINSCRNUR NONE DETECTED 03/01/2012 2341   LABBENZ NONE DETECTED 03/01/2012 2341   AMPHETMU NONE DETECTED 03/01/2012 2341   THCU NONE DETECTED 03/01/2012 2341   LABBARB NONE DETECTED 03/01/2012 2341    Alcohol Level: No results found for this basename: ETH:2 in the last 168 hours  CT of the brain  03/01/2012 Noncontrast CT appearance of the brain is within normal limits for age.   MRI of the brain  03/02/2012 Acute non hemorrhagic infarct posterior right frontal lobe and portion of the right parietal lobe.   MRA of the brain  03/02/2012  Intracranial atherosclerotic type changes  2D Echocardiogram  EF 45% with no source of embolus.   Carotid Doppler  No internal carotid artery stenosis bilaterally.  Vertebrals with antegrade flow bilaterally.   CXR  03/02/2012   Stable cardiopulmonary appearance with probable nipple shadow overlying the lower left lung field.  Recommend repeat PA view with nipple markers to confirm this impression and exclude the possibility of an intraparenchymal nodule.  Stable cardiomegaly and aortic ectasia.    EKG  normal sinus rhythm.   Therapy Recommendations PT -none, OT -none  Physical Exam  Middle aged [place] male not in distress.Awake alert.  Neurological Exam : Awake alert oriented x 3 normal language.Eye movements full. Normal visual acuity and fields. Mild left lower face asymmetry. Tongue midline. No drift. Mild diminished fine finger movements on left. Orbits right over left upper extremity. Mild left grip weak.. Normal sensation . Normal coordination. Gait deferred. No pronator drift.  ASSESSMENT [name] is a 76 y.o. male with acute non hemorrhagic infarct posterior right frontal lobe and portion of the right parietal lobe. strokes secondary to unknown embolic etiology. On no antiplatelets prior to admission (stopped taking low dose aspirin over 1.5 years ago). Now on aspirin 325 mg orally every day for secondary stroke prevention. Patient with resultant left hemiparesis and slurred speech that has now cleared  -hypertension -hyperlipidemia, LDL 94 -renal insufficieny -cigarette smoker, quit 3 years ago  Hospital day # 2  TREATMENT/PLAN -Continue aspirin 325 mg orally every day for secondary stroke prevention. -TEE to look for embolic source. Please arrange with pts cardiologist or cardiologist of choice. Will need to be NPO after midnight. Ok to discharge today and do as an outpatient next week.  If positive for PFO (patent foramen ovale), check bilateral lower extremity venous dopplers to rule out DVT as possible source of stroke.  -if TEE negative as OP, recommend outpatient telemetry monitoring to assess patient for atrial  fibrillation as source of stroke. May be arranged with patient's cardiologist, or cardiologist of choice.   Joaquin Music, ANP-BC, GNP-BC [facility] Pager: [phone] 03/03/2012 9:31 AM  Dr. Lesia Sago has personally reviewed chart, pertinent data, examined the patient and developed the plan of care. Pager:  [phone]  Lesly Dukes

## 2012-03-03 NOTE — Progress Notes (Signed)
I called [facility] to schedule an OP TEE next week for pt to look for source of stroke. They will contact the patient, either by phone in the hospital, or at home, to set day and time for procedure. I put Dr. Pearlean Brownie as the ordering MD.  Annie Main, AVNP, ANP-BC, GNP-BC [facility] Pager: [phone] 03/03/2012 10:55 AM Lesly Dukes

## 2012-03-06 ENCOUNTER — Other Ambulatory Visit: Payer: Self-pay | Admitting: Internal Medicine

## 2012-03-06 DIAGNOSIS — R911 Solitary pulmonary nodule: Secondary | ICD-10-CM

## 2012-03-07 ENCOUNTER — Ambulatory Visit
Admission: RE | Admit: 2012-03-07 | Discharge: 2012-03-07 | Disposition: A | Discharge status: Home / Self Care | Payer: Medicare Other | Source: Ambulatory Visit | Attending: Internal Medicine | Admitting: Internal Medicine

## 2012-03-07 DIAGNOSIS — R911 Solitary pulmonary nodule: Secondary | ICD-10-CM

## 2012-03-10 ENCOUNTER — Encounter (HOSPITAL_COMMUNITY): Payer: Self-pay | Admitting: *Deleted

## 2012-03-10 ENCOUNTER — Encounter (HOSPITAL_COMMUNITY)
Admission: RE | Disposition: A | Discharge status: Home / Self Care | Payer: Self-pay | Source: Ambulatory Visit | Attending: Cardiovascular Disease

## 2012-03-10 ENCOUNTER — Ambulatory Visit (HOSPITAL_COMMUNITY)
Admission: RE | Admit: 2012-03-10 | Discharge: 2012-03-10 | Disposition: A | Discharge status: Home / Self Care | Payer: Medicare Other | Source: Ambulatory Visit | Attending: Cardiovascular Disease | Admitting: Cardiovascular Disease

## 2012-03-10 DIAGNOSIS — Z8673 Personal history of transient ischemic attack (TIA), and cerebral infarction without residual deficits: Secondary | ICD-10-CM | POA: Insufficient documentation

## 2012-03-10 DIAGNOSIS — I079 Rheumatic tricuspid valve disease, unspecified: Secondary | ICD-10-CM | POA: Insufficient documentation

## 2012-03-10 DIAGNOSIS — I10 Essential (primary) hypertension: Secondary | ICD-10-CM | POA: Insufficient documentation

## 2012-03-10 DIAGNOSIS — I359 Nonrheumatic aortic valve disorder, unspecified: Secondary | ICD-10-CM | POA: Insufficient documentation

## 2012-03-10 DIAGNOSIS — I379 Nonrheumatic pulmonary valve disorder, unspecified: Secondary | ICD-10-CM | POA: Insufficient documentation

## 2012-03-10 DIAGNOSIS — I059 Rheumatic mitral valve disease, unspecified: Secondary | ICD-10-CM | POA: Insufficient documentation

## 2012-03-10 HISTORY — PX: TEE WITHOUT CARDIOVERSION: SHX5443

## 2012-03-10 SURGERY — ECHOCARDIOGRAM, TRANSESOPHAGEAL
Anesthesia: Moderate Sedation

## 2012-03-10 MED ORDER — FENTANYL CITRATE 0.05 MG/ML IJ SOLN
INTRAMUSCULAR | Status: DC | PRN
  Administered 2012-03-10 (×2): 25 ug via INTRAVENOUS

## 2012-03-10 MED ORDER — MIDAZOLAM HCL 10 MG/2ML IJ SOLN
INTRAMUSCULAR | Status: DC | PRN
  Administered 2012-03-10 (×2): 1 mg via INTRAVENOUS

## 2012-03-10 MED ORDER — DIPHENHYDRAMINE HCL 50 MG/ML IJ SOLN
INTRAMUSCULAR | Status: AC
  Filled 2012-03-10: qty 1

## 2012-03-10 MED ORDER — FENTANYL CITRATE 0.05 MG/ML IJ SOLN
INTRAMUSCULAR | Status: AC
  Filled 2012-03-10: qty 2

## 2012-03-10 MED ORDER — SODIUM CHLORIDE 0.9 % IV SOLN
Freq: Once | INTRAVENOUS | Status: AC
  Administered 2012-03-10: 500 mL via INTRAVENOUS

## 2012-03-10 MED ORDER — MIDAZOLAM HCL 10 MG/2ML IJ SOLN
INTRAMUSCULAR | Status: AC
  Filled 2012-03-10: qty 2

## 2012-03-10 MED ORDER — BUTAMBEN-TETRACAINE-BENZOCAINE 2-2-14 % EX AERO
INHALATION_SPRAY | CUTANEOUS | Status: DC | PRN
  Administered 2012-03-10: 2 via TOPICAL

## 2012-03-10 NOTE — Progress Notes (Signed)
  Echocardiogram Echocardiogram Transesophageal has been performed.  [name] 03/10/2012, 10:45 AM

## 2012-03-10 NOTE — CV Procedure (Signed)
INDICATIONS:   The patient is 76 years old with recent stroke.Marland Kitchen  PROCEDURE:  Informed consent was discussed including risks, benefits and alternatives for the procedure.  Risks include, but are not limited to, cough, sore throat, vomiting, nausea, somnolence, esophageal and stomach trauma or perforation, bleeding, low blood pressure, aspiration, pneumonia, infection, trauma to the teeth and death.    Patient was given sedation.  The oropharynx was anesthetized with topical lidocaine.  The transesophageal probe was inserted in the esophagus and stomach and multiple views were obtained.  Agitated saline was used after the transesophageal probe was removed from the body.  The patient was kept under observation until the patient left the procedure room.  The patient left the procedure room in stable condition.   COMPLICATIONS:  There were no immediate complications.  FINDINGS:  1. LEFT VENTRICLE: The left ventricle is normal in structure and function.  Wall motion is normal.  No thrombus or masses seen in the left ventricle.  2. RIGHT VENTRICLE:  The right ventricle is normal in structure and function without any thrombus or masses.    3. LEFT ATRIUM:  The left atrium is mildly dilated without any thrombus or masses.  4. LEFT ATRIAL APPENDAGE:  The left atrial appendage is free of any thrombus or masses.  5. RIGHT ATRIUM:  The right atrium is free of any thrombus or masses.    6. ATRIAL SEPTUM:  The atrial septum is normal without any ASD or PFO.  7. MITRAL VALVE:  The mitral valve is normal in structure and function without masses, stenosis or vegetations. Mild central jet of regurgitation is seen.  8. TRICUSPID VALVE:  The tricuspid valve is normal in structure and function with mild regurgitation, no masses, stenosis or vegetations.  9. AORTIC VALVE:  The aortic valve is normal in structure and function with mild regurgitation, no masses, stenosis or vegetations.   10. PULMONIC VALVE:   The pulmonic valve is normal in structure and function with mild regurgitation, no masses, stenosis or vegetations.  11. AORTIC ARCH, ASCENDING AND DESCENDING AORTA:  The aorta had mild atherosclerosis in the ascending or descending aorta.  The aortic arch was normal.  IMPRESSION:   Normal LV systolic function Mild MR, TR, AI and PI.Marland Kitchen  RECOMMENDATIONS:    Medical treatment.

## 2012-03-10 NOTE — H&P (Signed)
Subjective:  Here for TEE. Recent stroke.  Objective:  Vital Signs in the last 24 hours: Temp:  [97.7 F (36.5 C)] 97.7 F (36.5 C) (06/28 0851) Pulse Rate:  [63] 63  (06/28 0851) Cardiac Rhythm:  [-]  Resp:  [20] 20  (06/28 0851) BP: (133)/(90) 133/90 mmHg (06/28 0851) SpO2:  [97 %] 97 % (06/28 0851)  Physical Exam: BP Readings from Last 1 Encounters:  03/10/12 133/90    Wt Readings from Last 1 Encounters:  03/01/12 71.668 kg (158 lb)    Weight change:   HEENT: [place]/AT, Eyes-Brown, PERL, EOMI, Conjunctiva-Pink, Sclera-Non-icteric Neck: No JVD, No bruit, Trachea midline. Lungs:  Clear, Bilateral. Cardiac:  Regular rhythm, normal S1 and S2, no S3.  Abdomen:  Soft, non-tender. Extremities:  No edema present. No cyanosis. No clubbing. CNS: AxOx3, Cranial nerves grossly intact, moves all 4 extremities. Right handed. Left sided weakness Skin: Warm and dry.   Intake/Output from previous day:      Lab Results: BMET    Component Value Date/Time   NA 139 03/03/2012 1030   K 4.1 03/03/2012 1030   CL 106 03/03/2012 1030   CO2 24 03/03/2012 1030   GLUCOSE 104* 03/03/2012 1030   BUN 18 03/03/2012 1030   CREATININE 1.11 03/03/2012 1030   CALCIUM 9.4 03/03/2012 1030   GFRNONAA 64* 03/03/2012 1030   GFRAA 74* 03/03/2012 1030   CBC    Component Value Date/Time   WBC 4.7 03/03/2012 1030   RBC 4.20* 03/03/2012 1030   HGB 12.5* 03/03/2012 1030   HCT 37.5* 03/03/2012 1030   PLT 146* 03/03/2012 1030   MCV 89.3 03/03/2012 1030   MCH 29.8 03/03/2012 1030   MCHC 33.3 03/03/2012 1030   RDW 13.7 03/03/2012 1030   LYMPHSABS 1.4 03/01/2012 2040   MONOABS 0.4 03/01/2012 2040   EOSABS 0.2 03/01/2012 2040   BASOSABS 0.0 03/01/2012 2040   CARDIAC ENZYMES Lab Results  Component Value Date   CKTOTAL 247* 03/01/2012   CKMB 3.3 03/01/2012   TROPONINI <0.30 03/01/2012    Assessment/Plan:  Patient Active Hospital Problem List: Recent CVA HTN  TEE today.    LOS: 0 days    Orpah Cobb  MD    03/10/2012, 9:35 AM

## 2012-03-10 NOTE — Discharge Instructions (Signed)
Transesophageal Echocardiography A transesophageal echocardiogram (TEE) is a special type of test that produces images of the heart by sound waves (echocardiogram). This type of echocardiogram can obtain better images of the heart than a standard echocardiogram. A TEE is done by passing a flexible tube down the esophagus. The heart is located in front of the esophagus. Because the heart and esophagus are close to one another, your caregiver can take very clear, detailed pictures of the heart via ultrasound waves. WHY HAVE A TEE? Your caregiver may need more information based on your medical condition. A TEE is usually performed due to the following:  Your caregiver needs more information based on standard echocardiogram findings.   If you had a stroke, this might have happened because a clot formed in your heart. A TEE can visualize different areas of the heart and check for clots.   To check valve anatomy and function. Your caregiver will especially look at the mitral valve.   To check for redness, soreness, and swelling (inflammation) on the inside lining of the heart (endocarditis).   To evaluate the dividing wall (septum) of the heart and presence of a hole that did not close after birth (patent foramen ovale, PFO).   To help diagnose a tear in the wall of the aorta (aortic dissection).   During cardiac valve surgery, a TEE probe is placed. This allows the surgeon to assess the valve repair before closing the chest.  LET YOUR CAREGIVER KNOW ABOUT:   Swallowing difficulties.   An esophageal obstruction.   Use of aspirin or antiplatelet therapy.  RISKS AND COMPLICATIONS  Though extremely rare, an esophageal tear (rupture) is a potential complication. BEFORE THE PROCEDURE   Arrive at least 1 hour before the procedure or as told by your caregiver.   Do not eat or drink for 6 hours before the procedure or as told by your caregiver.   An intravenous (IV) access tube will be started in  the arm.  PROCEDURE   A medicine to help you relax (sedative) will be given through the IV.   A medicine that numbs the area (local anesthetic) may be sprayed to the back of the throat.   Your blood pressure, heart rate, and breathing (vital signs) will be monitored during the procedure.   The TEE probe is a long, flexible tube. It is about the width of an adult male's index finger. The tip of the probe is placed into the back of the mouth and you will be asked to swallow. This helps to pass the tip of the probe into the esophagus. Once the tip of the probe is in the correct area, your caregiver can take pictures of the heart.   A TEE is usually not a painful procedure. You may feel the probe press against the back of the throat. The probe does not enter the trachea and does not affect your breathing.   Your time spent at the hospital is usually less than 2 hours.  AFTER THE PROCEDURE   You will be in bed, resting until you have fully returned to consciousness.   When you first awaken, your throat may feel slightly sore and will probably still feel numb. This will improve slowly over time.   You will not be allowed to eat or drink until it is clear that numbness has improved.   Once you have been able to drink, urinate, and sit on the edge of the bed without feeling sick to your stomach (nauseous)   or dizzy, you may be cleared to dress and go home.   Do not drive yourself home. You have had medications that can continue to make you feel drowsy and can impair your reflexes.   You should have a friend or family member with you for the next 24 hours after your examination.  Obtaining the test results It is your responsibility to obtain your test results. Ask the lab or department performing the test when and how you will get your results. SEEK IMMEDIATE MEDICAL CARE IF:   There is chest pain.   You have a hard time breathing or have shortness of breath.   You cough or throw up (vomit)  blood.  MAKE SURE YOU:   Understand these instructions.   Will watch this condition.   Will get help right away if you is not doing well or gets worse.  Document Released: 11/20/2002 Document Revised: 08/19/2011 Document Reviewed: 02/11/2009 Exit[employer] Patient Information 2012 [employer]. 

## 2013-05-02 ENCOUNTER — Other Ambulatory Visit: Payer: Self-pay | Admitting: *Deleted

## 2013-05-02 ENCOUNTER — Ambulatory Visit: Payer: Self-pay | Admitting: Neurology

## 2013-05-07 ENCOUNTER — Other Ambulatory Visit: Payer: Self-pay | Admitting: *Deleted

## 2013-05-07 DIAGNOSIS — E785 Hyperlipidemia, unspecified: Secondary | ICD-10-CM

## 2013-05-07 DIAGNOSIS — E876 Hypokalemia: Secondary | ICD-10-CM

## 2013-05-15 ENCOUNTER — Other Ambulatory Visit: Payer: Medicare Other

## 2013-05-22 ENCOUNTER — Ambulatory Visit: Payer: Medicare Other | Admitting: Endocrinology

## 2013-05-24 ENCOUNTER — Encounter: Payer: Self-pay | Admitting: Endocrinology

## 2013-05-24 ENCOUNTER — Ambulatory Visit (INDEPENDENT_AMBULATORY_CARE_PROVIDER_SITE_OTHER): Payer: Medicare Other | Admitting: Endocrinology

## 2013-05-24 VITALS — BP 120/80 | HR 56 | Temp 98.4°F | Ht 65.5 in | Wt 160.9 lb

## 2013-05-24 DIAGNOSIS — E876 Hypokalemia: Secondary | ICD-10-CM

## 2013-05-24 DIAGNOSIS — R7301 Impaired fasting glucose: Secondary | ICD-10-CM

## 2013-05-24 DIAGNOSIS — N289 Disorder of kidney and ureter, unspecified: Secondary | ICD-10-CM

## 2013-05-24 DIAGNOSIS — E785 Hyperlipidemia, unspecified: Secondary | ICD-10-CM

## 2013-05-24 LAB — LIPID PANEL
Cholesterol: 201 mg/dL — ABNORMAL HIGH (ref 0–200)
VLDL: 26.2 mg/dL (ref 0.0–40.0)

## 2013-05-24 LAB — BASIC METABOLIC PANEL
BUN: 24 mg/dL — ABNORMAL HIGH (ref 6–23)
Calcium: 9.2 mg/dL (ref 8.4–10.5)
Creatinine, Ser: 1.3 mg/dL (ref 0.4–1.5)
GFR: 55.24 mL/min — ABNORMAL LOW (ref >60.00)
Glucose, Bld: 98 mg/dL (ref 70–99)
Potassium: 4 mEq/L (ref 3.5–5.1)

## 2013-05-24 NOTE — Patient Instructions (Signed)
No change   If Niaspan causes flushing can take Aspirin 30 min before

## 2013-05-24 NOTE — Progress Notes (Signed)
Patient ID: [name], male   DOB: 17-Mar-1936, 77 y.o.   MRN: [id number]  Chief complaint: Followup  History of Present Illness:   He has been evaluated for prediabetes. His A1c tends to be relatively high and at least once over 6.5% but his glucose readings have been normal. At the moment previous records are not available but he was determined not to have diabetes with only impaired fasting glucose. Has been advised to watch portion, fats and increase weight loss efforts with exercise. He thinks he has been losing weight although exercising with walking only 2 times a week  Hyperlipidemia with history of? CVA. He has had high LDL, triglycerides and low HDL. Again previous records not available. Niacin was added because of high particle number and low HDL. He is taking generic Niaspan and has no flushing with taking this at night     Medication List       This list is accurate as of: 05/24/13 11:59 PM.  Always use your most recent med list.               amLODipine 2.5 MG tablet  Commonly known as:  NORVASC  Take 2.5 mg by mouth daily.     aspirin 325 MG tablet  Take 325 mg by mouth daily.     atorvastatin 20 MG tablet  Commonly known as:  LIPITOR  Take 20 mg by mouth daily.     fenofibrate 160 MG tablet  Take 160 mg by mouth daily.     lisinopril 10 MG tablet  Commonly known as:  PRINIVIL,ZESTRIL  Take 10 mg by mouth daily.     niacin 500 MG CR tablet  Commonly known as:  NIASPAN        Allergies: No Known Allergies  Past Medical History  Diagnosis Date  . Renal disorder   . Hypertension     Past Surgical History  Procedure Laterality Date  . Appendectomy    . Tee without cardioversion  03/10/2012    Procedure: TRANSESOPHAGEAL ECHOCARDIOGRAM (TEE);  Surgeon: Ricki Rodriguez, MD;  Location: [facility] ENDOSCOPY;  Service: Cardiovascular;  Laterality: N/A;    History reviewed. No pertinent family history.  Social History:  reports that he has quit smoking.  He does not have any smokeless tobacco history on file. He reports that  drinks alcohol. He reports that he does not use illicit drugs.  Review of Systems   He has had hypertension for several years   EXAM:  BP 120/80  Pulse 56  Temp(Src) 98.4 F (36.9 C) (Oral)  Ht 5' 5.5" (1.664 m)  Wt 160 lb 14.4 oz (72.984 kg)  BMI 26.36 kg/m2  SpO2 97%  Assessment/Plan:    Prediabetes: Will check labs today, since A1c is not indicated of his actual readings will not check this unless glucose is increasing  Hyperlipidemia: To have labs done again  History of renal insufficiency: To have renal functions checked   [name] 05/28/2013, 8:08 AM   Addendum: Labs as follows. LDL is still high, will change Lipitor to Crestor 20 mg; glucose normal and creatinine 1.3. To followup in 3 months and review old records when available  Office Visit on 05/24/2013  Component Date Value Range Status  . Sodium 05/24/2013 138  135 - 145 mEq/L Final  . Potassium 05/24/2013 4.0  3.5 - 5.1 mEq/L Final  . Chloride 05/24/2013 108  96 - 112 mEq/L Final  . CO2 05/24/2013 25  19 - 32 mEq/L Final  .  Glucose, Bld 05/24/2013 98  70 - 99 mg/dL Final  . BUN 16/06/9603 24* 6 - 23 mg/dL Final  . Creatinine, Ser 05/24/2013 1.3  0.4 - 1.5 mg/dL Final  . Calcium 54/05/8118 9.2  8.4 - 10.5 mg/dL Final  . GFR 14/78/2956 55.24* >60.00 mL/min Final  . Cholesterol 05/24/2013 201* 0 - 200 mg/dL Final   ATP III Classification       Desirable:  < 200 mg/dL               Borderline High:  200 - 239 mg/dL          High:  > = 213 mg/dL  . Triglycerides 05/24/2013 131.0  0.0 - 149.0 mg/dL Final   Normal:  <086 mg/dLBorderline High:  150 - 199 mg/dL  . HDL 05/24/2013 32.60* >39.00 mg/dL Final  . VLDL 57/84/6962 26.2  0.0 - 40.0 mg/dL Final  . Total CHOL/HDL Ratio 05/24/2013 6   Final                  Men          Women1/2 Average Risk     3.4          3.3Average Risk          5.0          4.42X Average Risk          9.6           7.13X Average Risk          15.0          11.0                      . Direct LDL 05/24/2013 148.8   Final   Optimal:  <100 mg/dLNear or Above Optimal:  100-129 mg/dLBorderline High:  130-159 mg/dLHigh:  160-189 mg/dLVery High:  >190 mg/dL

## 2013-05-25 ENCOUNTER — Other Ambulatory Visit: Payer: Self-pay | Admitting: *Deleted

## 2013-05-25 MED ORDER — ROSUVASTATIN CALCIUM 20 MG PO TABS: 20.0000 mg | ORAL_TABLET | Freq: Every day | ORAL | Status: DC

## 2013-05-28 DIAGNOSIS — R7301 Impaired fasting glucose: Secondary | ICD-10-CM | POA: Insufficient documentation

## 2013-07-17 IMAGING — CR DG CHEST 2V
2 series · 2 of 2 positions shown · non-contrast
Comparison: 01/31/2012

CLINICAL DATA: Former smoker with history of hypertension,
hypercholesterolemia and stroke

CHEST - 2 VIEW

[w chest pa]
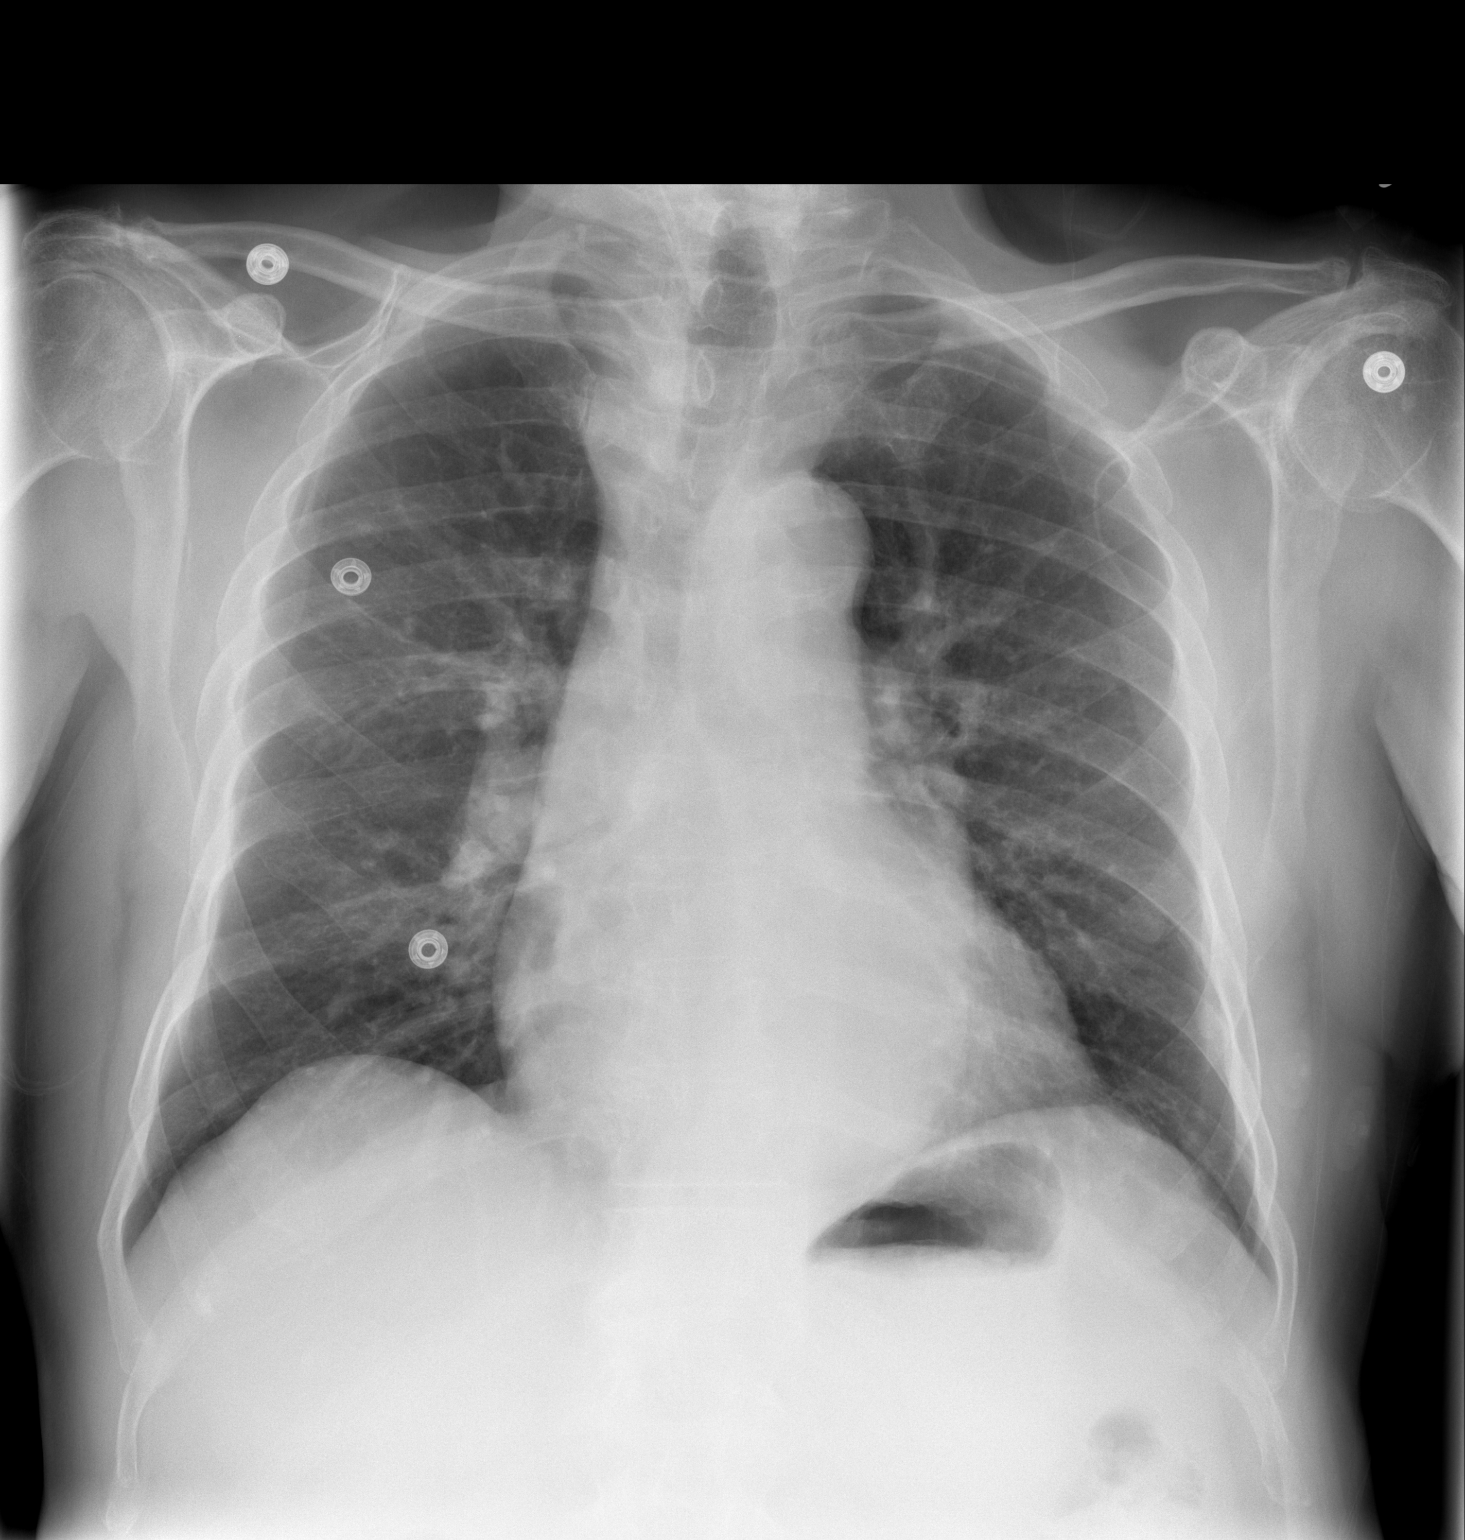

[w chest lat]
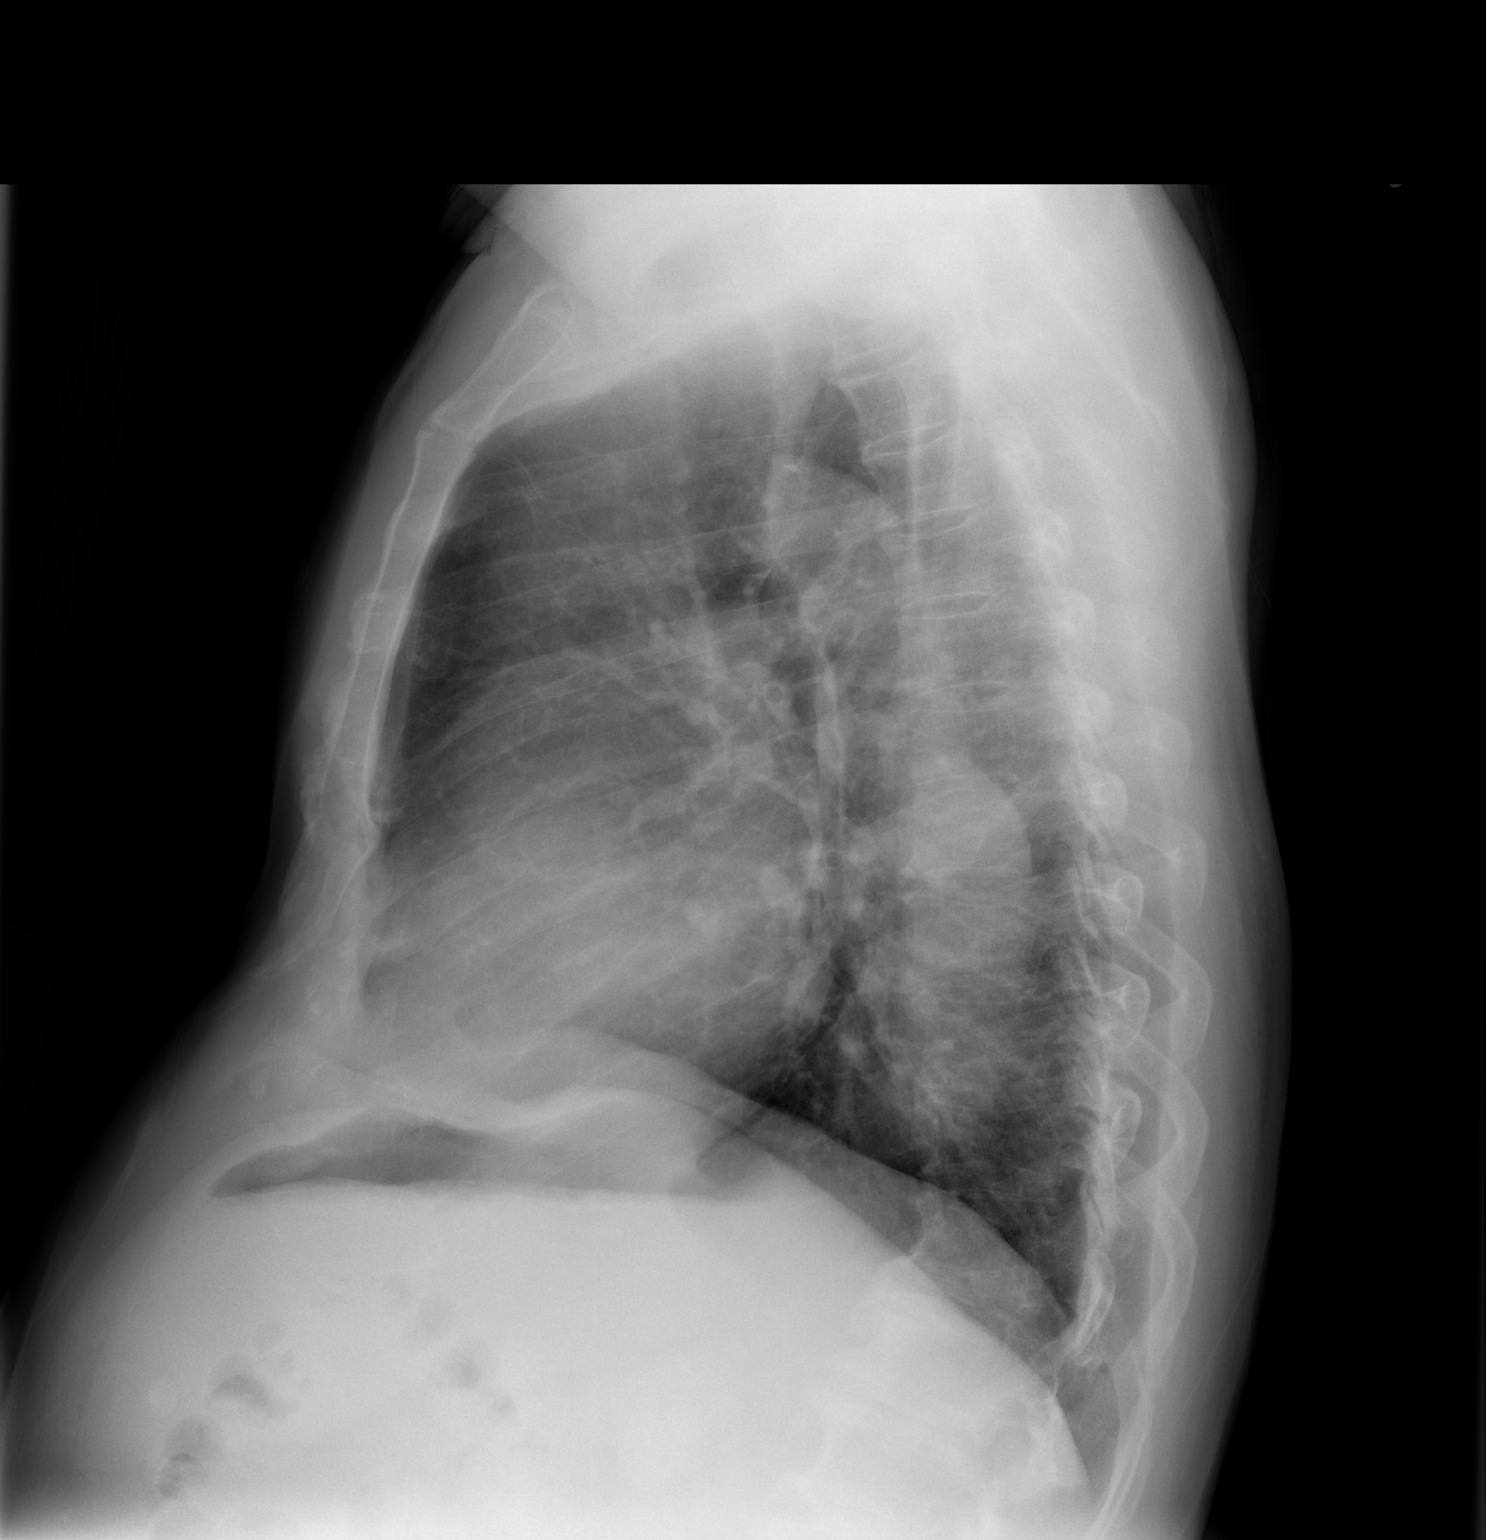

[2 of 2 positions shown; findings below may reference images not displayed]

FINDINGS: Mild cardiac enlargement with aortic ectasia is stable.
The lung fields are again notable for a nodular density overlying
the lower third of the left lung, well circumscribed around [DATE] of
the border and most likely representing a nipple shadow.  Repeat PA
view with nipple markers is recommended to confirm this impression.
No other focal parenchymal abnormalities are suggested.  No pleural
fluid is seen.

Bony structures demonstrate degenerative change of the mid and
lower thoracic and upper lumbar spine.
IMPRESSION: Stable cardiopulmonary appearance with probable nipple shadow
overlying the lower left lung field.  Recommend repeat PA view with
nipple markers to confirm this impression and exclude the
possibility of an intraparenchymal nodule.

Stable cardiomegaly and aortic ectasia.

## 2013-08-22 ENCOUNTER — Other Ambulatory Visit: Payer: Self-pay | Admitting: *Deleted

## 2013-08-22 MED ORDER — NIACIN ER (ANTIHYPERLIPIDEMIC) 500 MG PO TBCR: 500.0000 mg | EXTENDED_RELEASE_TABLET | Freq: Every day | ORAL | Status: DC

## 2013-09-20 ENCOUNTER — Other Ambulatory Visit: Payer: Medicare Other

## 2013-09-27 ENCOUNTER — Ambulatory Visit: Payer: Medicare Other | Admitting: Endocrinology

## 2013-10-22 ENCOUNTER — Other Ambulatory Visit: Payer: Self-pay | Admitting: *Deleted

## 2013-10-22 MED ORDER — ROSUVASTATIN CALCIUM 20 MG PO TABS: 20.0000 mg | ORAL_TABLET | Freq: Every day | ORAL | Status: DC

## 2013-11-23 ENCOUNTER — Ambulatory Visit: Payer: Medicare Other | Admitting: Endocrinology

## 2013-12-04 DIAGNOSIS — Z87891 Personal history of nicotine dependence: Secondary | ICD-10-CM | POA: Insufficient documentation

## 2014-11-14 DIAGNOSIS — M159 Polyosteoarthritis, unspecified: Secondary | ICD-10-CM | POA: Insufficient documentation

## 2014-11-14 DIAGNOSIS — J449 Chronic obstructive pulmonary disease, unspecified: Secondary | ICD-10-CM | POA: Insufficient documentation

## 2014-11-14 DIAGNOSIS — R351 Nocturia: Secondary | ICD-10-CM | POA: Insufficient documentation

## 2015-03-03 ENCOUNTER — Other Ambulatory Visit (HOSPITAL_COMMUNITY): Payer: Self-pay | Admitting: Family Medicine

## 2015-03-03 DIAGNOSIS — R600 Localized edema: Secondary | ICD-10-CM

## 2015-03-10 ENCOUNTER — Ambulatory Visit (HOSPITAL_COMMUNITY)
Admission: RE | Admit: 2015-03-10 | Discharge: 2015-03-10 | Disposition: A | Discharge status: Home / Self Care | Payer: Medicare HMO | Source: Ambulatory Visit | Attending: Cardiovascular Disease | Admitting: Cardiovascular Disease

## 2015-03-10 DIAGNOSIS — I253 Aneurysm of heart: Secondary | ICD-10-CM | POA: Diagnosis not present

## 2015-03-10 DIAGNOSIS — I351 Nonrheumatic aortic (valve) insufficiency: Secondary | ICD-10-CM | POA: Insufficient documentation

## 2015-03-10 DIAGNOSIS — I517 Cardiomegaly: Secondary | ICD-10-CM | POA: Insufficient documentation

## 2015-03-10 DIAGNOSIS — R600 Localized edema: Secondary | ICD-10-CM

## 2015-05-11 ENCOUNTER — Encounter (HOSPITAL_COMMUNITY): Payer: Self-pay | Admitting: *Deleted

## 2015-05-11 ENCOUNTER — Emergency Department (HOSPITAL_COMMUNITY)
Admission: EM | Admit: 2015-05-11 | Discharge: 2015-05-11 | Disposition: A | Discharge status: Home Health | Payer: Medicare HMO | Attending: Emergency Medicine | Admitting: Emergency Medicine

## 2015-05-11 ENCOUNTER — Emergency Department (HOSPITAL_COMMUNITY): Payer: Medicare HMO

## 2015-05-11 DIAGNOSIS — Z87891 Personal history of nicotine dependence: Secondary | ICD-10-CM | POA: Insufficient documentation

## 2015-05-11 DIAGNOSIS — Z8673 Personal history of transient ischemic attack (TIA), and cerebral infarction without residual deficits: Secondary | ICD-10-CM | POA: Diagnosis not present

## 2015-05-11 DIAGNOSIS — Z79899 Other long term (current) drug therapy: Secondary | ICD-10-CM | POA: Diagnosis not present

## 2015-05-11 DIAGNOSIS — Z7982 Long term (current) use of aspirin: Secondary | ICD-10-CM | POA: Insufficient documentation

## 2015-05-11 DIAGNOSIS — E876 Hypokalemia: Secondary | ICD-10-CM | POA: Diagnosis not present

## 2015-05-11 DIAGNOSIS — I10 Essential (primary) hypertension: Secondary | ICD-10-CM | POA: Diagnosis not present

## 2015-05-11 DIAGNOSIS — R42 Dizziness and giddiness: Secondary | ICD-10-CM | POA: Insufficient documentation

## 2015-05-11 DIAGNOSIS — Z87448 Personal history of other diseases of urinary system: Secondary | ICD-10-CM | POA: Diagnosis not present

## 2015-05-11 DIAGNOSIS — Z7951 Long term (current) use of inhaled steroids: Secondary | ICD-10-CM | POA: Insufficient documentation

## 2015-05-11 LAB — COMPREHENSIVE METABOLIC PANEL
ALBUMIN: 4.4 g/dL (ref 3.5–5.0)
ALK PHOS: 63 U/L (ref 38–126)
ALT: 16 U/L — AB (ref 17–63)
AST: 23 U/L (ref 15–41)
Anion gap: 9 (ref 5–15)
BUN: 26 mg/dL — AB (ref 6–20)
CALCIUM: 9.2 mg/dL (ref 8.9–10.3)
CO2: 25 mmol/L (ref 22–32)
CREATININE: 1.34 mg/dL — AB (ref 0.61–1.24)
Chloride: 105 mmol/L (ref 101–111)
GFR calc non Af Amer: 50 mL/min — ABNORMAL LOW (ref >60)
GFR, EST AFRICAN AMERICAN: 58 mL/min — AB (ref >60)
GLUCOSE: 216 mg/dL — AB (ref 65–99)
Potassium: 3 mmol/L — ABNORMAL LOW (ref 3.5–5.1)
SODIUM: 139 mmol/L (ref 135–145)
Total Bilirubin: 1 mg/dL (ref 0.3–1.2)
Total Protein: 7.9 g/dL (ref 6.5–8.1)

## 2015-05-11 LAB — I-STAT CHEM 8, ED
BUN: 30 mg/dL — ABNORMAL HIGH (ref 6–20)
CREATININE: 1.3 mg/dL — AB (ref 0.61–1.24)
Calcium, Ion: 1.19 mmol/L (ref 1.13–1.30)
Chloride: 104 mmol/L (ref 101–111)
Glucose, Bld: 215 mg/dL — ABNORMAL HIGH (ref 65–99)
HEMATOCRIT: 47 % (ref 39.0–52.0)
HEMOGLOBIN: 16 g/dL (ref 13.0–17.0)
POTASSIUM: 3 mmol/L — AB (ref 3.5–5.1)
Sodium: 141 mmol/L (ref 135–145)
TCO2: 22 mmol/L (ref 0–100)

## 2015-05-11 LAB — DIFFERENTIAL
Basophils Absolute: 0 10*3/uL (ref 0.0–0.1)
Basophils Relative: 0 % (ref 0–1)
Eosinophils Absolute: 0.4 10*3/uL (ref 0.0–0.7)
Eosinophils Relative: 4 % (ref 0–5)
LYMPHS PCT: 49 % — AB (ref 12–46)
Lymphs Abs: 5.3 10*3/uL — ABNORMAL HIGH (ref 0.7–4.0)
MONO ABS: 0.6 10*3/uL (ref 0.1–1.0)
Monocytes Relative: 6 % (ref 3–12)
NEUTROS ABS: 4.4 10*3/uL (ref 1.7–7.7)
Neutrophils Relative %: 41 % — ABNORMAL LOW (ref 43–77)

## 2015-05-11 LAB — CBC
HCT: 44.3 % (ref 39.0–52.0)
Hemoglobin: 14.7 g/dL (ref 13.0–17.0)
MCH: 30.2 pg (ref 26.0–34.0)
MCHC: 33.2 g/dL (ref 30.0–36.0)
MCV: 91 fL (ref 78.0–100.0)
PLATELETS: 145 10*3/uL — AB (ref 150–400)
RBC: 4.87 MIL/uL (ref 4.22–5.81)
RDW: 14.2 % (ref 11.5–15.5)
WBC: 10.8 10*3/uL — AB (ref 4.0–10.5)

## 2015-05-11 LAB — I-STAT TROPONIN, ED: Troponin i, poc: 0 ng/mL (ref 0.00–0.08)

## 2015-05-11 LAB — APTT: aPTT: 24 seconds (ref 24–37)

## 2015-05-11 LAB — RAPID URINE DRUG SCREEN, HOSP PERFORMED
AMPHETAMINES: NOT DETECTED
BARBITURATES: NOT DETECTED
Benzodiazepines: NOT DETECTED
Cocaine: NOT DETECTED
Opiates: NOT DETECTED
TETRAHYDROCANNABINOL: NOT DETECTED

## 2015-05-11 LAB — LIPASE, BLOOD: LIPASE: 17 U/L — AB (ref 22–51)

## 2015-05-11 LAB — PROTIME-INR
INR: 1.07 (ref 0.00–1.49)
PROTHROMBIN TIME: 14.1 s (ref 11.6–15.2)

## 2015-05-11 LAB — CBG MONITORING, ED: Glucose-Capillary: 171 mg/dL — ABNORMAL HIGH (ref 65–99)

## 2015-05-11 MED ORDER — ONDANSETRON HCL 4 MG/2ML IJ SOLN
4.0000 mg | Freq: Once | INTRAMUSCULAR | Status: AC
  Administered 2015-05-11: 4 mg via INTRAVENOUS
  Filled 2015-05-11: qty 2

## 2015-05-11 MED ORDER — MECLIZINE HCL 25 MG PO TABS: 25.0000 mg | ORAL_TABLET | Freq: Three times a day (TID) | ORAL | Status: DC | PRN

## 2015-05-11 MED ORDER — POTASSIUM CHLORIDE CRYS ER 20 MEQ PO TBCR
40.0000 meq | EXTENDED_RELEASE_TABLET | Freq: Once | ORAL | Status: AC
  Administered 2015-05-11: 40 meq via ORAL
  Filled 2015-05-11: qty 2

## 2015-05-11 MED ORDER — ONDANSETRON HCL 4 MG PO TABS: 4.0000 mg | ORAL_TABLET | Freq: Four times a day (QID) | ORAL | Status: DC

## 2015-05-11 NOTE — ED Notes (Signed)
MD at bedside. 

## 2015-05-11 NOTE — Discharge Instructions (Signed)
Dizziness °Dizziness is a common problem. It is a feeling of unsteadiness or light-headedness. You may feel like you are about to faint. Dizziness can lead to injury if you stumble or fall. A person of any age group can suffer from dizziness, but dizziness is more common in older adults. °CAUSES  °Dizziness can be caused by many different things, including: °· Middle ear problems. °· Standing for too long. °· Infections. °· An allergic reaction. °· Aging. °· An emotional response to something, such as the sight of blood. °· Side effects of medicines. °· Tiredness. °· Problems with circulation or blood pressure. °· Excessive use of alcohol or medicines, or illegal drug use. °· Breathing too fast (hyperventilation). °· An irregular heart rhythm (arrhythmia). °· A low red blood cell count (anemia). °· Pregnancy. °· Vomiting, diarrhea, fever, or other illnesses that cause body fluid loss (dehydration). °· Diseases or conditions such as Parkinson's disease, high blood pressure (hypertension), diabetes, and thyroid problems. °· Exposure to extreme heat. °DIAGNOSIS  °Your health care provider will ask about your symptoms, perform a physical exam, and perform an electrocardiogram (ECG) to record the electrical activity of your heart. Your health care provider may also perform other heart or blood tests to determine the cause of your dizziness. These may include: °· Transthoracic echocardiogram (TTE). During echocardiography, sound waves are used to evaluate how blood flows through your heart. °· Transesophageal echocardiogram (TEE). °· Cardiac monitoring. This allows your health care provider to monitor your heart rate and rhythm in real time. °· Holter monitor. This is a portable device that records your heartbeat and can help diagnose heart arrhythmias. It allows your health care provider to track your heart activity for several days if needed. °· Stress tests by exercise or by giving medicine that makes the heart beat  faster. °TREATMENT  °Treatment of dizziness depends on the cause of your symptoms and can vary greatly. °HOME CARE INSTRUCTIONS  °· Drink enough fluids to keep your urine clear or pale yellow. This is especially important in very hot weather. In older adults, it is also important in cold weather. °· Take your medicine exactly as directed if your dizziness is caused by medicines. When taking blood pressure medicines, it is especially important to get up slowly. °¨ Rise slowly from chairs and steady yourself until you feel okay. °¨ In the morning, first sit up on the side of the bed. When you feel okay, stand slowly while holding onto something until you know your balance is fine. °· Move your legs often if you need to stand in one place for a long time. Tighten and relax your muscles in your legs while standing. °· Have someone stay with you for 1-2 days if dizziness continues to be a problem. Do this until you feel you are well enough to stay alone. Have the person call your health care provider if he or she notices changes in you that are concerning. °· Do not drive or use heavy machinery if you feel dizzy. °· Do not drink alcohol. °SEEK IMMEDIATE MEDICAL CARE IF:  °· Your dizziness or light-headedness gets worse. °· You feel nauseous or vomit. °· You have problems talking, walking, or using your arms, hands, or legs. °· You feel weak. °· You are not thinking clearly or you have trouble forming sentences. It may take a friend or family member to notice this. °· You have chest pain, abdominal pain, shortness of breath, or sweating. °· Your vision changes. °· You notice   any bleeding. °· You have side effects from medicine that seems to be getting worse rather than better. °MAKE SURE YOU:  °· Understand these instructions. °· Will watch your condition. °· Will get help right away if you are not doing well or get worse. °Document Released: 02/23/2001 Document Revised: 09/04/2013 Document Reviewed: 03/19/2011 °[employer]®  Patient Information ©2015 [employer]. This information is not intended to replace advice given to you by your health care provider. Make sure you discuss any questions you have with your health care provider. ° °Benign Positional Vertigo °Vertigo means you feel like you or your surroundings are moving when they are not. Benign positional vertigo is the most common form of vertigo. Benign means that the cause of your condition is not serious. Benign positional vertigo is more common in older adults. °CAUSES  °Benign positional vertigo is the result of an upset in the labyrinth system. This is an area in the middle ear that helps control your balance. This may be caused by a viral infection, head injury, or repetitive motion. However, often no specific cause is found. °SYMPTOMS  °Symptoms of benign positional vertigo occur when you move your head or eyes in different directions. Some of the symptoms may include: °· Loss of balance and falls. °· Vomiting. °· Blurred vision. °· Dizziness. °· Nausea. °· Involuntary eye movements (nystagmus). °DIAGNOSIS  °Benign positional vertigo is usually diagnosed by physical exam. If the specific cause of your benign positional vertigo is unknown, your caregiver may perform imaging tests, such as magnetic resonance imaging (MRI) or computed tomography (CT). °TREATMENT  °Your caregiver may recommend movements or procedures to correct the benign positional vertigo. Medicines such as meclizine, benzodiazepines, and medicines for nausea may be used to treat your symptoms. In rare cases, if your symptoms are caused by certain conditions that affect the inner ear, you may need surgery. °HOME CARE INSTRUCTIONS  °· Follow your caregiver's instructions. °· Move slowly. Do not make sudden body or head movements. °· Avoid driving. °· Avoid operating heavy machinery. °· Avoid performing any tasks that would be dangerous to you or others during a vertigo episode. °· Drink enough fluids to keep  your urine clear or pale yellow. °SEEK IMMEDIATE MEDICAL CARE IF:  °· You develop problems with walking, weakness, numbness, or using your arms, hands, or legs. °· You have difficulty speaking. °· You develop severe headaches. °· Your nausea or vomiting continues or gets worse. °· You develop visual changes. °· Your family or friends notice any behavioral changes. °· Your condition gets worse. °· You have a fever. °· You develop a stiff neck or sensitivity to light. °MAKE SURE YOU:  °· Understand these instructions. °· Will watch your condition. °· Will get help right away if you are not doing well or get worse. °Document Released: 06/07/2006 Document Revised: 11/22/2011 Document Reviewed: 05/20/2011 °[employer]® Patient Information ©2015 [employer]. This information is not intended to replace advice given to you by your health care provider. Make sure you discuss any questions you have with your health care provider. ° °

## 2015-05-11 NOTE — ED Notes (Signed)
Pt possible stroke Per wife Last seen normal at breakfast 10 am, got up from table and couldn't walk He has a hx of stroke with no deficits approx 3 years ago. Positive for sweating, nausea and vomiting with this episode No facial asymmetry seen, sensation normal bilateral extremities Grips strong and equal C/o mild abdominal pain

## 2015-05-11 NOTE — ED Notes (Signed)
Patient transported to CT 

## 2015-05-11 NOTE — ED Notes (Signed)
Patient ambulated with minimal assistance to restroom and back

## 2015-05-11 NOTE — ED Provider Notes (Signed)
CSN: [id number]     Arrival date & time 05/11/15  1025 History   First MD Initiated Contact with Patient 05/11/15 1027     Chief complaint: Dizziness, nausea and weakness HPI Patient presents to the emergency room for evaluation of sudden onset of dizziness and weakness. The patient states he feels bad and weak. Patient woke up this morning he felt fine. Sometime after 10 AM after having eaten breakfast he had the sudden onset of feeling dizzy where he was spinning associated with nausea. Patient felt that he was weak all over and he had trouble standing up to walk. He did not feel like he is falling from one side to the other. He did not feel like he had weakness on one side versus the other. Patient also felt sweaty and became nauseated and vomited. He continues to have persistent nausea and feels like he is going to throw up. He denies any trouble with abdominal pain. He denies any trouble with chest pain or shortness of breath. Patient does have a history of stroke in the past. He has no persistent symptoms. Past Medical History  Diagnosis Date  . Renal disorder   . Hypertension    Past Surgical History  Procedure Laterality Date  . Appendectomy    . Tee without cardioversion  03/10/2012    Procedure: TRANSESOPHAGEAL ECHOCARDIOGRAM (TEE);  Surgeon: Ricki Rodriguez, MD;  Location: [facility] ENDOSCOPY;  Service: Cardiovascular;  Laterality: N/A;   History reviewed. No pertinent family history. Social History  Substance Use Topics  . Smoking status: Former Games developer  . Smokeless tobacco: None  . Alcohol Use: Yes    Review of Systems  All other systems reviewed and are negative.     Allergies  Review of patient's allergies indicates no known allergies.  Home Medications   Prior to Admission medications   Medication Sig Start Date End Date Taking? Authorizing Provider  amLODipine (NORVASC) 10 MG tablet Take 10 mg by mouth daily.   Yes Historical Provider, MD  aspirin 325 MG tablet Take 650  mg by mouth daily.    Yes Historical Provider, MD  budesonide-formoterol (SYMBICORT) 80-4.5 MCG/ACT inhaler Inhale 2 puffs into the lungs 2 (two) times daily.   Yes Historical Provider, MD  calcium-vitamin D (OSCAL WITH D) 500-200 MG-UNIT per tablet Take 1 tablet by mouth daily with breakfast.   Yes Historical Provider, MD  dutasteride (AVODART) 0.5 MG capsule Take 0.5 mg by mouth daily.   Yes Historical Provider, MD  fluticasone (FLONASE) 50 MCG/ACT nasal spray Place 2 sprays into both nostrils daily.   Yes Historical Provider, MD  furosemide (LASIX) 20 MG tablet Take 20 mg by mouth.   Yes Historical Provider, MD  olmesartan (BENICAR) 20 MG tablet Take 20 mg by mouth daily.   Yes Historical Provider, MD  rosuvastatin (CRESTOR) 20 MG tablet Take 1 tablet (20 mg total) by mouth daily. 10/22/13  Yes Reather Littler, MD  meclizine (ANTIVERT) 25 MG tablet Take 1 tablet (25 mg total) by mouth 3 (three) times daily as needed for dizziness or nausea. 05/11/15   Linwood Dibbles, MD  niacin (NIASPAN) 500 MG CR tablet Take 1 tablet (500 mg total) by mouth at bedtime. Patient not taking: Reported on 05/11/2015 08/22/13   Reather Littler, MD  ondansetron (ZOFRAN) 4 MG tablet Take 1 tablet (4 mg total) by mouth every 6 (six) hours. 05/11/15   Linwood Dibbles, MD   BP 121/82 mmHg  Pulse 60  Temp(Src) 97 F (36.1  C) (Oral)  Resp 21  SpO2 86% Physical Exam  Constitutional: He is oriented to person, place, and time. He appears well-developed and well-nourished. He appears distressed.  HENT:  Head: Normocephalic and atraumatic.  Right Ear: External ear normal.  Left Ear: External ear normal.  Mouth/Throat: Oropharynx is clear and moist.  Eyes: Conjunctivae are normal. Right eye exhibits no discharge. Left eye exhibits no discharge. No scleral icterus.  Neck: Neck supple. No tracheal deviation present.  Cardiovascular: Normal rate, regular rhythm and intact distal pulses.   Pulmonary/Chest: Effort normal and breath sounds normal. No  stridor. No respiratory distress. He has no wheezes. He has no rales.  Abdominal: Soft. Bowel sounds are normal. He exhibits no distension. There is no tenderness. There is no rebound and no guarding.  Musculoskeletal: He exhibits no edema or tenderness.  Neurological: He is alert and oriented to person, place, and time. He has normal strength. No cranial nerve deficit (No facial droop, extraocular movements intact, tongue midline ) or sensory deficit. He exhibits normal muscle tone. He displays no seizure activity. Coordination normal. GCS eye subscore is 4. GCS verbal subscore is 5. GCS motor subscore is 6.  No pronator drift bilateral upper extrem, able to hold both legs off bed for 5 seconds, sensation intact in all extremities, no visual field cuts, no left or right sided neglect, normal finger-nose exam bilaterally, positive nystagmus noted   Skin: Skin is warm and dry. No rash noted. He is not diaphoretic.  Psychiatric: He has a normal mood and affect.  Nursing note and vitals reviewed.   ED Course  Procedures (including critical care time) Labs Review Labs Reviewed  CBC - Abnormal; Notable for the following:    WBC 10.8 (*)    Platelets 145 (*)    All other components within normal limits  DIFFERENTIAL - Abnormal; Notable for the following:    Neutrophils Relative % 41 (*)    Lymphocytes Relative 49 (*)    Lymphs Abs 5.3 (*)    All other components within normal limits  COMPREHENSIVE METABOLIC PANEL - Abnormal; Notable for the following:    Potassium 3.0 (*)    Glucose, Bld 216 (*)    BUN 26 (*)    Creatinine, Ser 1.34 (*)    ALT 16 (*)    GFR calc non Af Amer 50 (*)    GFR calc Af Amer 58 (*)    All other components within normal limits  LIPASE, BLOOD - Abnormal; Notable for the following:    Lipase 17 (*)    All other components within normal limits  CBG MONITORING, ED - Abnormal; Notable for the following:    Glucose-Capillary 171 (*)    All other components within  normal limits  I-STAT CHEM 8, ED - Abnormal; Notable for the following:    Potassium 3.0 (*)    BUN 30 (*)    Creatinine, Ser 1.30 (*)    Glucose, Bld 215 (*)    All other components within normal limits  PROTIME-INR  APTT  URINE RAPID DRUG SCREEN, HOSP PERFORMED  ETHANOL  URINALYSIS, ROUTINE W REFLEX MICROSCOPIC (NOT AT [facility])  I-STAT TROPOININ, ED    Imaging Review Ct Head Wo Contrast  05/11/2015   CLINICAL DATA:  Dizziness, sudden onset of weakness. Could not walk beginning 10 a.m. this morning. Diaphoresis, nausea, vomiting.  EXAM: CT HEAD WITHOUT CONTRAST  TECHNIQUE: Contiguous axial images were obtained from the base of the skull through the vertex without  intravenous contrast.  COMPARISON:  03/01/2012.  MRI 03/02/2012.  FINDINGS: Mild cerebral atrophy. No acute intracranial abnormality. Specifically, no hemorrhage, hydrocephalus, mass lesion, acute infarction, or significant intracranial injury. No acute calvarial abnormality. Visualized paranasal sinuses and mastoids clear. Orbital soft tissues unremarkable.  IMPRESSION: No acute intracranial abnormality.   Electronically Signed   By: Charlett Nose M.D.   On: 05/11/2015 11:17     EKG Interpretation   Date/Time:  Sunday May 11 2015 10:33:40 EDT Ventricular Rate:  65 PR Interval:  193 QRS Duration: 123 QT Interval:  446 QTC Calculation: 464 R Axis:   -4 Text Interpretation:  Sinus rhythm Nonspecific intraventricular conduction  delay No significant change since last tracing Confirmed by Saul Fabiano  MD-J,  Emmersyn Kratzke ([id number]) on 05/11/2015 10:49:29 AM     Medications  ondansetron (ZOFRAN) injection 4 mg (4 mg Intravenous Given 05/11/15 1100)  potassium chloride SA (K-DUR,KLOR-CON) CR tablet 40 mEq (40 mEq Oral Given 05/11/15 1233)    MDM   Final diagnoses:  Vertigo  Hypokalemia    Patient presented with acute onset of vertigo-type symptoms associated with nausea and vomiting. Complained of some difficulty with his ability to  ambulate at home. On my exam he has no focal neurologic deficits including a normal finger to nose exam. Initial  laboratory testing and CT scans are unremarkable with the exception of some mild electrolyte abnormalities.  The patient is feeling somewhat better after dose of Zofran but not completely resolved. Plan on assessing his ability to ambulate.    Patient was able to walk to the restroom and back with minimal assistance. He did not have any ataxia.  I suspect his symptoms may be related to peripheral vertigo and not a central cause (ie stroke). He did have sudden onset.  Will DC home with prescription for Zofran and meclizine. Warning signs and precautions discussed.    Linwood Dibbles, MD 05/11/15 [phone]

## 2015-05-11 NOTE — ED Notes (Signed)
Patient is aware we need urine-urinal at bedside 

## 2015-07-09 ENCOUNTER — Ambulatory Visit (INDEPENDENT_AMBULATORY_CARE_PROVIDER_SITE_OTHER): Payer: Medicare HMO | Admitting: Neurology

## 2015-07-09 ENCOUNTER — Encounter: Payer: Self-pay | Admitting: Neurology

## 2015-07-09 VITALS — BP 122/82 | HR 76 | Ht 65.0 in | Wt 172.5 lb

## 2015-07-09 DIAGNOSIS — R269 Unspecified abnormalities of gait and mobility: Secondary | ICD-10-CM | POA: Diagnosis not present

## 2015-07-09 DIAGNOSIS — I63411 Cerebral infarction due to embolism of right middle cerebral artery: Secondary | ICD-10-CM

## 2015-07-09 DIAGNOSIS — R42 Dizziness and giddiness: Secondary | ICD-10-CM

## 2015-07-09 HISTORY — DX: Dizziness and giddiness: R42

## 2015-07-09 NOTE — Patient Instructions (Addendum)
We will check MRI of the brain.  Fall Prevention in the Home  Falls can cause injuries and can affect people from all age groups. There are many simple things that you can do to make your home safe and to help prevent falls. WHAT CAN I DO ON THE OUTSIDE OF MY HOME?  Regularly repair the edges of walkways and driveways and fix any cracks.  Remove high doorway thresholds.  Trim any shrubbery on the main path into your home.  Use bright outdoor lighting.  Clear walkways of debris and clutter, including tools and rocks.  Regularly check that handrails are securely fastened and in good repair. Both sides of any steps should have handrails.  Install guardrails along the edges of any raised decks or porches.  Have leaves, snow, and ice cleared regularly.  Use sand or salt on walkways during winter months.  In the garage, clean up any spills right away, including grease or oil spills. WHAT CAN I DO IN THE BATHROOM?  Use night lights.  Install grab bars by the toilet and in the tub and shower. Do not use towel bars as grab bars.  Use non-skid mats or decals on the floor of the tub or shower.  If you need to sit down while you are in the shower, use a plastic, non-slip stool..  Keep the floor dry. Immediately clean up any water that spills on the floor.  Remove soap buildup in the tub or shower on a regular basis.  Attach bath mats securely with double-sided non-slip rug tape.  Remove throw rugs and other tripping hazards from the floor. WHAT CAN I DO IN THE BEDROOM?  Use night lights.  Make sure that a bedside light is easy to reach.  Do not use oversized bedding that drapes onto the floor.  Have a firm chair that has side arms to use for getting dressed.  Remove throw rugs and other tripping hazards from the floor. WHAT CAN I DO IN THE KITCHEN?   Clean up any spills right away.  Avoid walking on wet floors.  Place frequently used items in easy-to-reach  places.  If you need to reach for something above you, use a sturdy step stool that has a grab bar.  Keep electrical cables out of the way.  Do not use floor polish or wax that makes floors slippery. If you have to use wax, make sure that it is non-skid floor wax.  Remove throw rugs and other tripping hazards from the floor. WHAT CAN I DO IN THE STAIRWAYS?  Do not leave any items on the stairs.  Make sure that there are handrails on both sides of the stairs. Fix handrails that are broken or loose. Make sure that handrails are as long as the stairways.  Check any carpeting to make sure that it is firmly attached to the stairs. Fix any carpet that is loose or worn.  Avoid having throw rugs at the top or bottom of stairways, or secure the rugs with carpet tape to prevent them from moving.  Make sure that you have a light switch at the top of the stairs and the bottom of the stairs. If you do not have them, have them installed. WHAT ARE SOME OTHER FALL PREVENTION TIPS?  Wear closed-toe shoes that fit well and support your feet. Wear shoes that have rubber soles or low heels.  When you use a stepladder, make sure that it is completely opened and that the sides are firmly   locked. Have someone hold the ladder while you are using it. Do not climb a closed stepladder.  Add color or contrast paint or tape to grab bars and handrails in your home. Place contrasting color strips on the first and last steps.  Use mobility aids as needed, such as canes, walkers, scooters, and crutches.  Turn on lights if it is dark. Replace any light bulbs that burn out.  Set up furniture so that there are clear paths. Keep the furniture in the same spot.  Fix any uneven floor surfaces.  Choose a carpet design that does not hide the edge of steps of a stairway.  Be aware of any and all pets.  Review your medicines with your healthcare provider. Some medicines can cause dizziness or changes in blood pressure,  which increase your risk of falling. Talk with your health care provider about other ways that you can decrease your risk of falls. This may include working with a physical therapist or trainer to improve your strength, balance, and endurance.   This information is not intended to replace advice given to you by your health care provider. Make sure you discuss any questions you have with your health care provider.   Document Released: 08/20/2002 Document Revised: 01/14/2015 Document Reviewed: 10/04/2014 Elsevier Interactive Patient Education 2016 [employer] Inc[employer]  

## 2015-07-09 NOTE — Progress Notes (Addendum)
Reason for visit: Dizziness, gait instability  Referring physician: [place]  [name] is a 79 y.o. male  History of present illness:  [name] is a 79 year old gentleman with a history of cerebrovascular disease. He was seen in the hospital around 03/02/2012 with left-sided face and arm weakness, he was found to have a right frontal and parietal stroke event that could have been embolic in nature. The patient has been placed on aspirin, and he has done well until around 05/11/2015. The patient had an event of sudden onset dizziness and vertigo associated with nausea and vomiting. The patient developed a slight posterior headache with this that has persisted until the present date. The dizziness and gait instability has improved however, he uses a cane for ambulation. He has not had any falls, he denies any problems with double vision, loss of vision, weakness or numbness of the extremities, slurred speech, or confusion. The patient is at or near his baseline at this point with walking. He is sent to this office for further evaluation. He did go to the emergency room in August, but a CT scan of the head was performed, not MRI. This study was unremarkable.  Past Medical History  Diagnosis Date  . Renal disorder   . Hypertension   . CVA (cerebral vascular accident) (HCC)   . Hyperlipidemia   . COPD (chronic obstructive pulmonary disease) (HCC)   . BPH (benign prostatic hyperplasia)   . Diabetes mellitus without complication (HCC)   . Dizziness and giddiness 07/09/2015    Past Surgical History  Procedure Laterality Date  . Appendectomy    . Tee without cardioversion  03/10/2012    Procedure: TRANSESOPHAGEAL ECHOCARDIOGRAM (TEE);  Surgeon: Ricki Rodriguez, MD;  Location: [facility] ENDOSCOPY;  Service: Cardiovascular;  Laterality: N/A;    Family History  Problem Relation Age of Onset  . Heart attack Father   . Cancer Mother   . Cancer Sister     brain tumor  . Heart attack  Brother   . Heart attack Brother   . Heart disease Brother     Social history:  reports that he has quit smoking. He has never used smokeless tobacco. He reports that he drinks alcohol. He reports that he does not use illicit drugs.  Medications:  Prior to Admission medications   Medication Sig Start Date End Date Taking? Authorizing Provider  amLODipine (NORVASC) 10 MG tablet Take 10 mg by mouth daily.   Yes Historical Provider, MD  aspirin 325 MG tablet Take 650 mg by mouth daily.    Yes Historical Provider, MD  budesonide-formoterol (SYMBICORT) 80-4.5 MCG/ACT inhaler Inhale 2 puffs into the lungs 2 (two) times daily.   Yes Historical Provider, MD  calcium-vitamin D (OSCAL WITH D) 500-200 MG-UNIT per tablet Take 1 tablet by mouth daily with breakfast.   Yes Historical Provider, MD  dutasteride (AVODART) 0.5 MG capsule Take 0.5 mg by mouth daily.   Yes Historical Provider, MD  fluticasone (FLONASE) 50 MCG/ACT nasal spray Place 2 sprays into both nostrils daily.   Yes Historical Provider, MD  furosemide (LASIX) 20 MG tablet Take 20 mg by mouth.   Yes Historical Provider, MD  loratadine (CLARITIN) 10 MG tablet Take 10 mg by mouth daily.   Yes Historical Provider, MD  Multiple Vitamin (MULTIVITAMIN) tablet Take 1 tablet by mouth daily.   Yes Historical Provider, MD  niacin (NIASPAN) 500 MG CR tablet Take 1 tablet (500 mg total) by mouth at bedtime. 08/22/13  Yes Reather LittlerAjay Kumar, MD  olmesartan (BENICAR) 20 MG tablet Take 20 mg by mouth daily.   Yes Historical Provider, MD  olopatadine (PATANOL) 0.1 % ophthalmic solution Place 1 drop into both eyes 2 (two) times daily.   Yes Historical Provider, MD  Omega-3 Fatty Acids (FISH OIL) 1000 MG CAPS Take 1 capsule by mouth daily.   Yes Historical Provider, MD  rosuvastatin (CRESTOR) 20 MG tablet Take 1 tablet (20 mg total) by mouth daily. 10/22/13  Yes Reather LittlerAjay Kumar, MD  tamsulosin (FLOMAX) 0.4 MG CAPS capsule Take 0.4 mg by mouth daily.   Yes Historical  Provider, MD  meclizine (ANTIVERT) 25 MG tablet Take 1 tablet (25 mg total) by mouth 3 (three) times daily as needed for dizziness or nausea. Patient not taking: Reported on 07/09/2015 05/11/15   Linwood DibblesJon Knapp, MD      Allergies  Allergen Reactions  . Ace Inhibitors     cough    ROS:  Out of a complete 14 system review of symptoms, the patient complains only of the following symptoms, and all other reviewed systems are negative.  Weight gain Swelling in the legs Cough Headache, weakness Snoring  Blood pressure 122/82, pulse 76, height 5\' 5"  (1.651 m), weight 172 lb 8 oz (78.245 kg).  Physical Exam  General: The patient is alert and cooperative at the time of the examination.  Eyes: Pupils are equal, round, and reactive to light. Discs are flat bilaterally.  Neck: The neck is supple, no carotid bruits are noted.  Respiratory: The respiratory examination is clear.  Cardiovascular: The cardiovascular examination reveals a regular rate and rhythm, no obvious murmurs or rubs are noted.  Skin: Extremities are without significant edema.  Neurologic Exam  Mental status: The patient is alert and oriented x 3 at the time of the examination. The patient has apparent normal recent and remote memory, with an apparently normal attention span and concentration ability.  Cranial nerves: Facial symmetry is present. There is good sensation of the face to pinprick and soft touch bilaterally. The strength of the facial muscles and the muscles to head turning and shoulder shrug are normal bilaterally. Speech is well enunciated, no aphasia or dysarthria is noted. Extraocular movements are full. Visual fields are full. The tongue is midline, and the patient has symmetric elevation of the soft palate. No obvious hearing deficits are noted.  Motor: The motor testing reveals 5 over 5 strength of all 4 extremities. Good symmetric motor tone is noted throughout.  Sensory: Sensory testing is intact to  pinprick, soft touch, vibration sensation, and position sense on all 4 extremities. No evidence of extinction is noted.  Coordination: Cerebellar testing reveals good finger-nose-finger and heel-to-shin bilaterally.  Gait and station: Gait is normal. Tandem gait is normal. Romberg is negative. No drift is seen.  Reflexes: Deep tendon reflexes are symmetric, but are depressed bilaterally. Toes are downgoing bilaterally.   Assessment/Plan:  1. Onset of dizziness, gait instability  2. History of right frontal and parietal stroke event  The patient has had a recent documented stroke. The patient has not had adequate evaluation following the onset of dizziness and gait instability. He will be sent for MRI of the brain. A new stroke has been noted, he will be sent for a 30 day cardiac monitor study. He will remain on aspirin at this time. Fortunately, he has returned to at or near his baseline.  Marlan Palau. Keith Quientin Jent MD 07/09/2015 6:36 PM  [facility] [address]  [place], [place] [address]  Phone [phone] Fax [phone]

## 2015-09-17 ENCOUNTER — Telehealth: Payer: Self-pay | Admitting: Neurology

## 2015-09-17 DIAGNOSIS — R42 Dizziness and giddiness: Secondary | ICD-10-CM

## 2015-09-17 NOTE — Telephone Encounter (Signed)
Patient called requesting to move forward with having a MRI. He stated that Dr. Anne HahnWillis discussed ordering a MRI for him on his last office visit. I advised the patient that I would move Forward with having his MRI scheduled and authorized once Dr. Anne HahnWillis creates the order. Patient can be reached @ [phone]

## 2015-09-17 NOTE — Telephone Encounter (Signed)
MRI the brain was recommended to reevaluate him for an event that could have represented a TIA or stroke in August 2016. If new lesions are noted, we would consider a prolonged cardiac monitor study. I will order the MRI the brain.

## 2015-10-09 ENCOUNTER — Ambulatory Visit
Admission: RE | Admit: 2015-10-09 | Discharge: 2015-10-09 | Disposition: A | Discharge status: Home / Self Care | Payer: Medicare HMO | Source: Ambulatory Visit | Attending: Neurology | Admitting: Neurology

## 2015-10-09 DIAGNOSIS — R42 Dizziness and giddiness: Secondary | ICD-10-CM | POA: Diagnosis not present

## 2015-10-10 ENCOUNTER — Telehealth: Payer: Self-pay | Admitting: Neurology

## 2015-10-10 DIAGNOSIS — R42 Dizziness and giddiness: Secondary | ICD-10-CM

## 2015-10-10 NOTE — Telephone Encounter (Signed)
I called the patient, talk with the family. The patient is having problems with headache and dizziness, the MRI shows evidence of a left mastoiditis. The patient is still having ongoing symptoms, I will make referral for ENT. This may be the source of his symptoms. There is no evidence of stroke.   MRI brain 10/09/15:  IMPRESSION:  Abnormal MRI brain (without) demonstrating: 1. Extensive T2/T2FLAIR signal abnormality in the left mastoid air cells extending into the left petrous apex. May represent left mastoiditis. This is a new finding compared to MRI on 03/02/12. 2. Mild diffuse and moderate mesial temporal atrophy. 3. No acute findings.

## 2015-10-20 ENCOUNTER — Other Ambulatory Visit: Payer: Self-pay | Admitting: Neurology

## 2016-09-24 IMAGING — CT CT HEAD W/O CM
2 series · 16 of 30 positions shown, 20 images · non-contrast
Comparison: 03/01/2012.  MRI 03/02/2012.

CLINICAL DATA: Dizziness, sudden onset of weakness. Could not walk
beginning 10 a.m. this morning. Diaphoresis, nausea, vomiting.

EXAM:
CT HEAD WITHOUT CONTRAST
TECHNIQUE: Contiguous axial images were obtained from the base of the skull
through the vertex without intravenous contrast.

[Series 2: head w/o · axial · non-contrast · 0.45mm/px · z∈[-128,-8]mm · 13 of 30 slices shown, 17 images]
[im 3/30  brain]
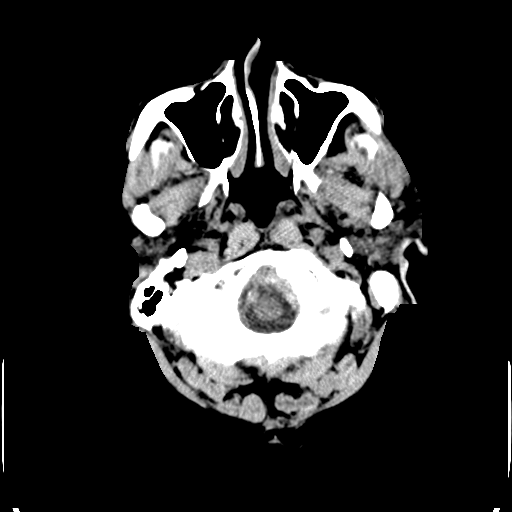
[im 3/30  bone]
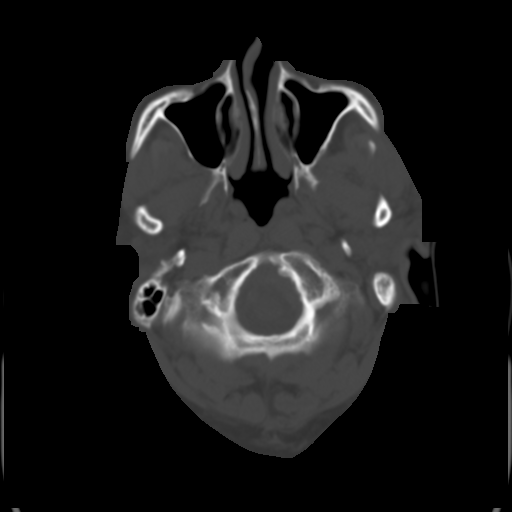
[im 5/30  brain]
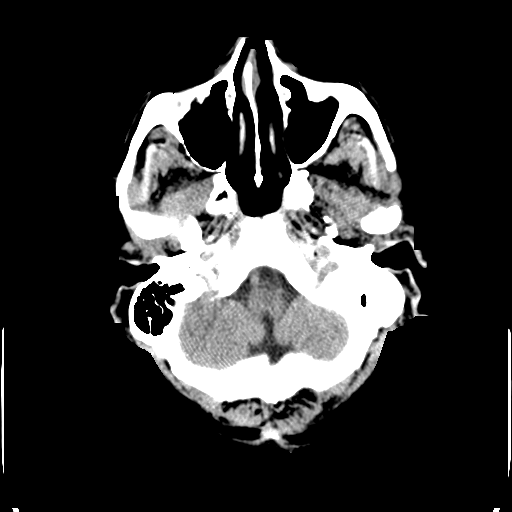
[im 7/30  brain]
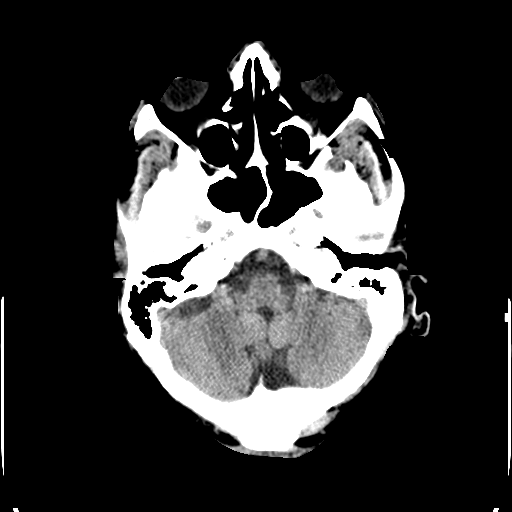
[im 9/30  brain]
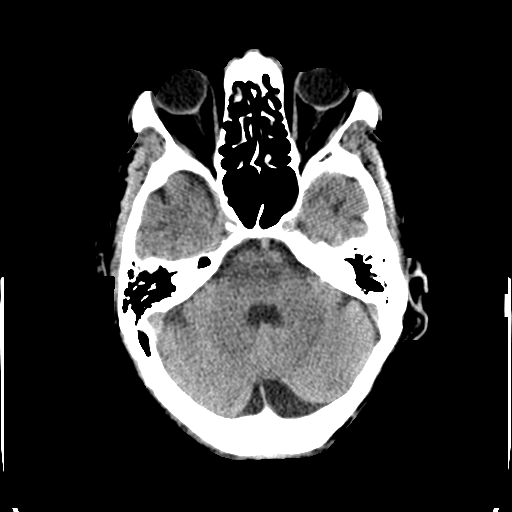
[im 11/30  brain]
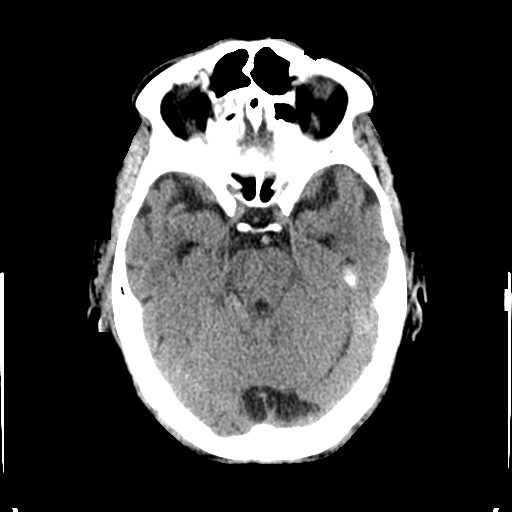
[im 11/30  bone]
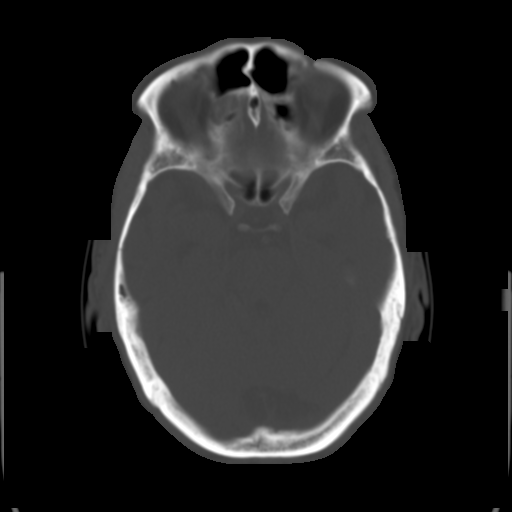
[im 13/30  brain]
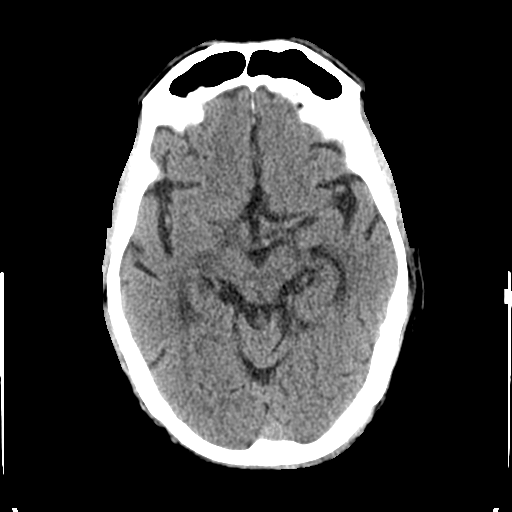
[im 15/30  brain]
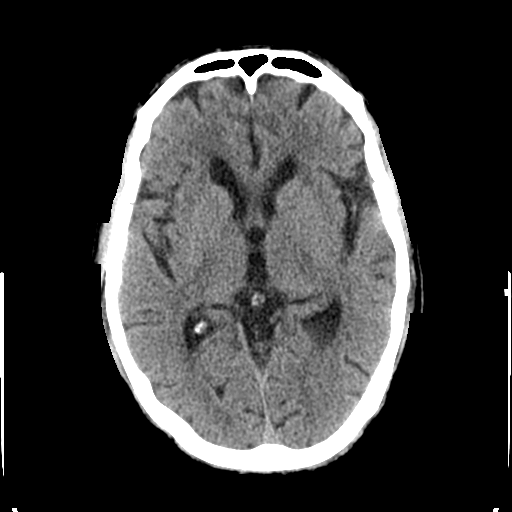
[im 17/30  brain]
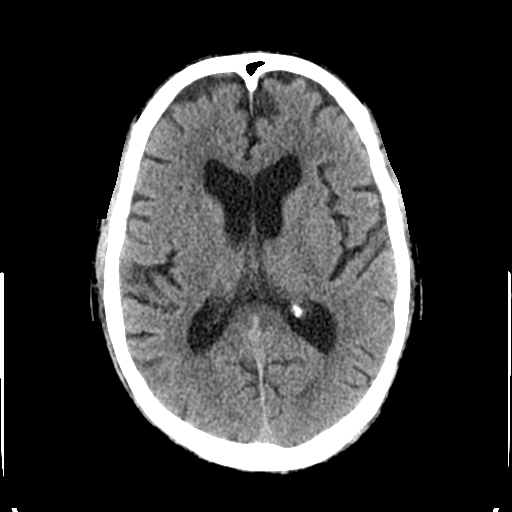
[im 19/30  brain]
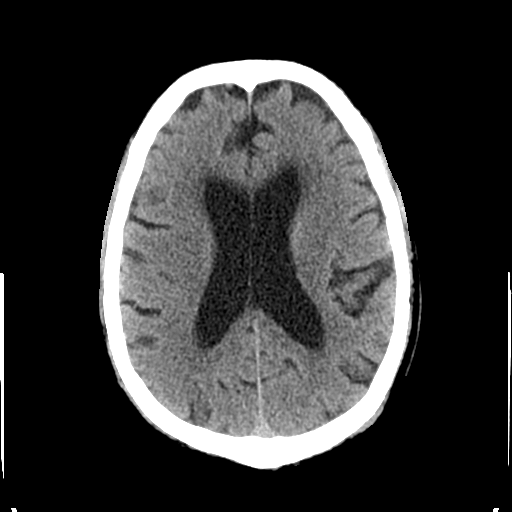
[im 19/30  bone]
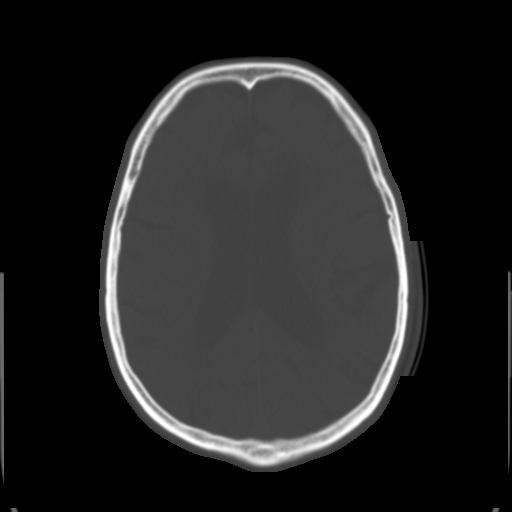
[im 21/30  brain]
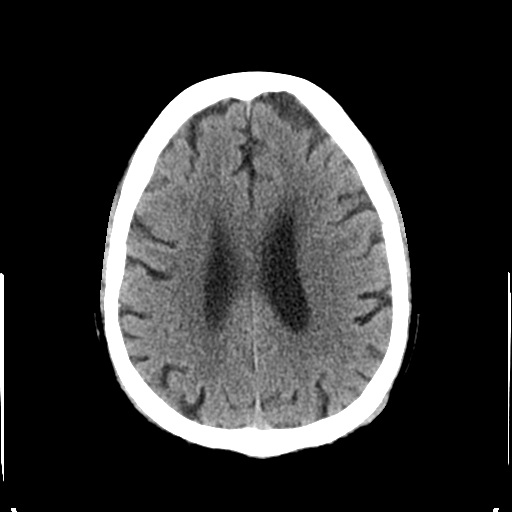
[im 23/30  brain]
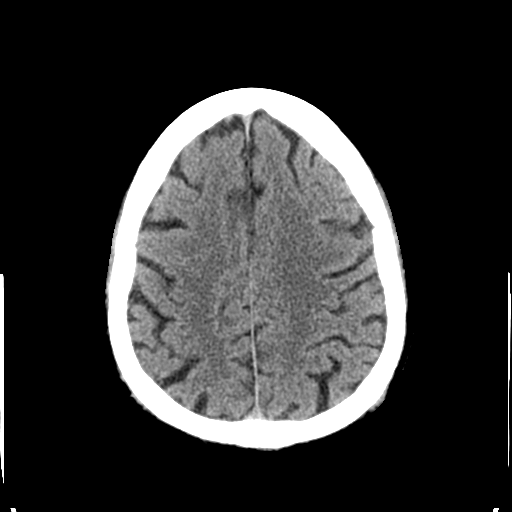
[im 25/30  brain]
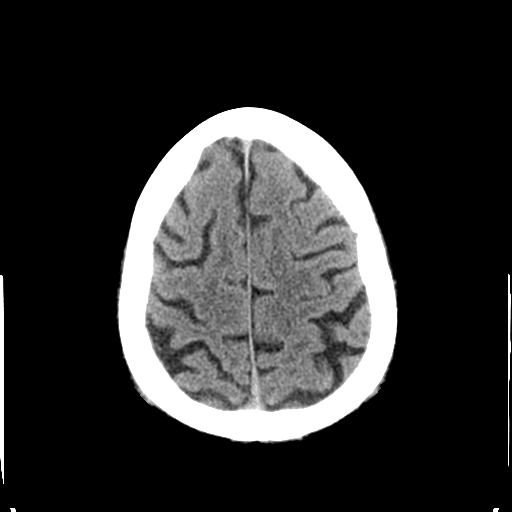
[im 27/30  brain]
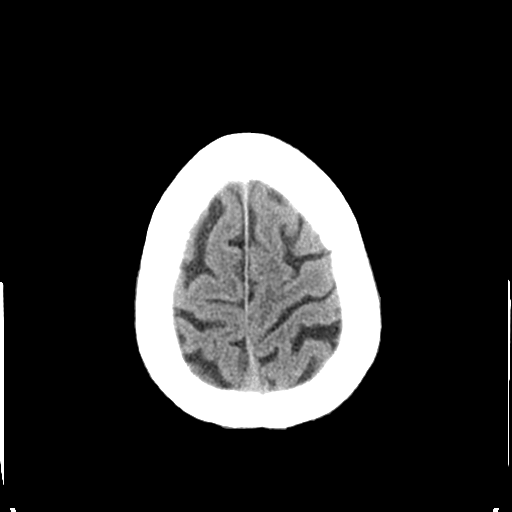
[im 27/30  bone]
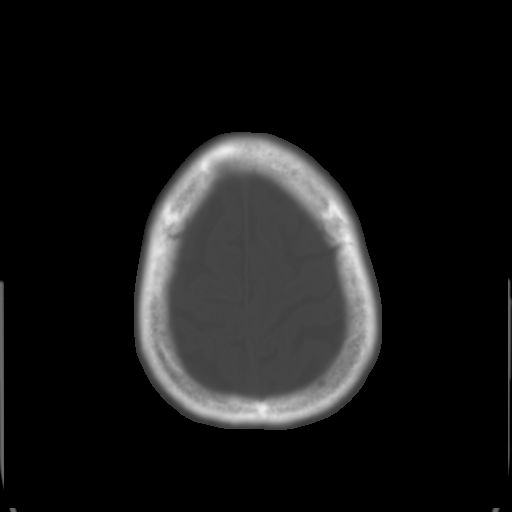

[Series 3: bone windows · axial · 0.45mm/px · z∈[-128,-88]mm · 3 of 30 slices shown]
[im 3/30  bone]
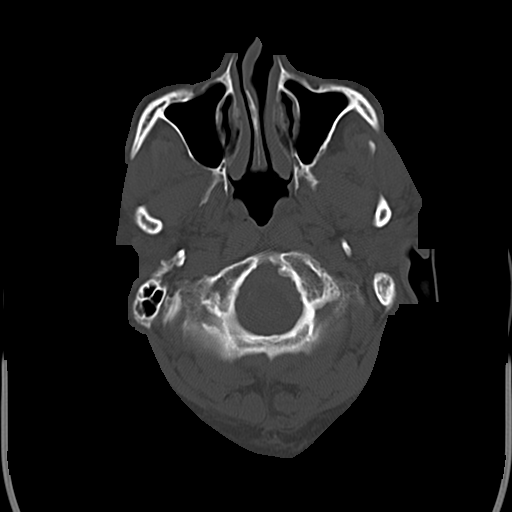
[im 7/30  bone]
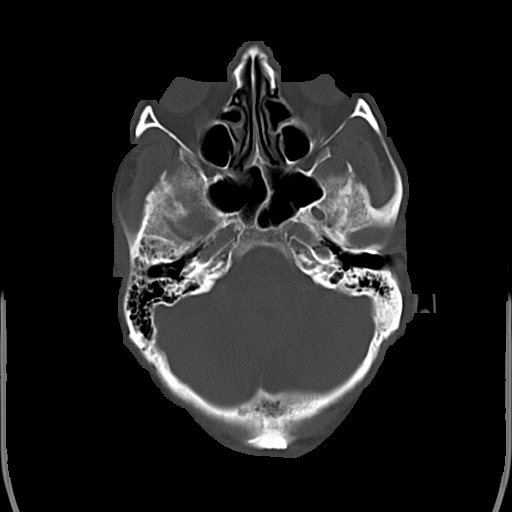
[im 11/30  bone]
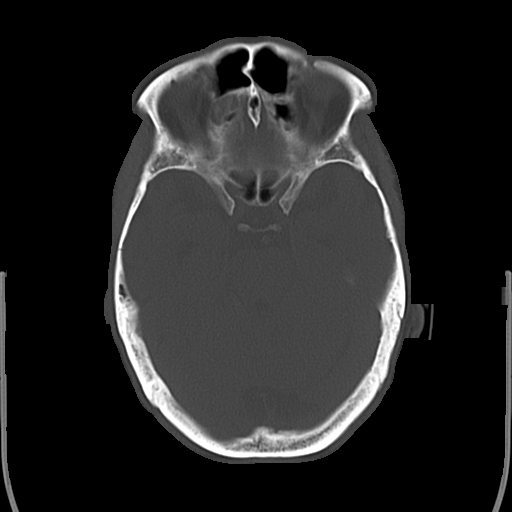

[16 of 30 positions shown; findings below may reference images not displayed]

FINDINGS: Mild cerebral atrophy. No acute intracranial abnormality.
Specifically, no hemorrhage, hydrocephalus, mass lesion, acute
infarction, or significant intracranial injury. No acute calvarial
abnormality. Visualized paranasal sinuses and mastoids clear.
Orbital soft tissues unremarkable.
IMPRESSION: No acute intracranial abnormality.

## 2018-12-12 DIAGNOSIS — D696 Thrombocytopenia, unspecified: Secondary | ICD-10-CM | POA: Insufficient documentation

## 2020-02-07 ENCOUNTER — Other Ambulatory Visit: Payer: Self-pay | Admitting: Nephrology

## 2020-02-07 DIAGNOSIS — N1831 Chronic kidney disease, stage 3a: Secondary | ICD-10-CM

## 2020-02-14 ENCOUNTER — Ambulatory Visit (INDEPENDENT_AMBULATORY_CARE_PROVIDER_SITE_OTHER): Payer: Medicare HMO | Admitting: Family Medicine

## 2020-02-14 ENCOUNTER — Encounter: Payer: Self-pay | Admitting: Family Medicine

## 2020-02-14 ENCOUNTER — Other Ambulatory Visit: Payer: Self-pay

## 2020-02-14 VITALS — BP 102/80 | HR 68 | Temp 98.2°F | Ht 65.0 in | Wt 156.6 lb

## 2020-02-14 DIAGNOSIS — N400 Enlarged prostate without lower urinary tract symptoms: Secondary | ICD-10-CM

## 2020-02-14 DIAGNOSIS — E782 Mixed hyperlipidemia: Secondary | ICD-10-CM

## 2020-02-14 DIAGNOSIS — R7303 Prediabetes: Secondary | ICD-10-CM | POA: Diagnosis not present

## 2020-02-14 DIAGNOSIS — N1831 Chronic kidney disease, stage 3a: Secondary | ICD-10-CM

## 2020-02-14 DIAGNOSIS — I10 Essential (primary) hypertension: Secondary | ICD-10-CM | POA: Diagnosis not present

## 2020-02-14 DIAGNOSIS — R011 Cardiac murmur, unspecified: Secondary | ICD-10-CM

## 2020-02-14 DIAGNOSIS — E559 Vitamin D deficiency, unspecified: Secondary | ICD-10-CM | POA: Diagnosis not present

## 2020-02-14 LAB — MICROALBUMIN / CREATININE URINE RATIO
Creatinine,U: 93.8 mg/dL
Microalb Creat Ratio: 13.5 mg/g (ref 0.0–30.0)
Microalb, Ur: 12.6 mg/dL — ABNORMAL HIGH (ref 0.0–1.9)

## 2020-02-14 LAB — COMPREHENSIVE METABOLIC PANEL
ALT: 15 U/L (ref 0–53)
AST: 22 U/L (ref 0–37)
Albumin: 4.2 g/dL (ref 3.5–5.2)
Alkaline Phosphatase: 71 U/L (ref 39–117)
BUN: 19 mg/dL (ref 6–23)
CO2: 29 mEq/L (ref 19–32)
Calcium: 9.1 mg/dL (ref 8.4–10.5)
Chloride: 104 mEq/L (ref 96–112)
Creatinine, Ser: 1.15 mg/dL (ref 0.40–1.50)
GFR: 60.93 mL/min (ref >60.00)
Glucose, Bld: 116 mg/dL — ABNORMAL HIGH (ref 70–99)
Potassium: 4.2 mEq/L (ref 3.5–5.1)
Sodium: 140 mEq/L (ref 135–145)
Total Bilirubin: 0.6 mg/dL (ref 0.2–1.2)
Total Protein: 6.7 g/dL (ref 6.0–8.3)

## 2020-02-14 LAB — CBC WITH DIFFERENTIAL/PLATELET
Basophils Absolute: 0 10*3/uL (ref 0.0–0.1)
Basophils Relative: 0.6 % (ref 0.0–3.0)
Eosinophils Absolute: 0.2 10*3/uL (ref 0.0–0.7)
Eosinophils Relative: 4 % (ref 0.0–5.0)
HCT: 37.3 % — ABNORMAL LOW (ref 39.0–52.0)
Hemoglobin: 12.6 g/dL — ABNORMAL LOW (ref 13.0–17.0)
Lymphocytes Relative: 18.7 % (ref 12.0–46.0)
Lymphs Abs: 0.9 10*3/uL (ref 0.7–4.0)
MCHC: 33.7 g/dL (ref 30.0–36.0)
MCV: 91.4 fl (ref 78.0–100.0)
Monocytes Absolute: 0.3 10*3/uL (ref 0.1–1.0)
Monocytes Relative: 6.3 % (ref 3.0–12.0)
Neutro Abs: 3.3 10*3/uL (ref 1.4–7.7)
Neutrophils Relative %: 70.4 % (ref 43.0–77.0)
Platelets: 94 10*3/uL — ABNORMAL LOW (ref 150.0–400.0)
RBC: 4.08 Mil/uL — ABNORMAL LOW (ref 4.22–5.81)
RDW: 14.4 % (ref 11.5–15.5)
WBC: 4.7 10*3/uL (ref 4.0–10.5)

## 2020-02-14 LAB — LIPID PANEL
Cholesterol: 110 mg/dL (ref 0–200)
HDL: 30.5 mg/dL — ABNORMAL LOW (ref >39.00)
LDL Cholesterol: 48 mg/dL (ref 0–99)
NonHDL: 79.68
Total CHOL/HDL Ratio: 4
Triglycerides: 156 mg/dL — ABNORMAL HIGH (ref 0.0–149.0)
VLDL: 31.2 mg/dL (ref 0.0–40.0)

## 2020-02-14 LAB — HEMOGLOBIN A1C: Hgb A1c MFr Bld: 6.1 % (ref 4.6–6.5)

## 2020-02-14 LAB — VITAMIN D 25 HYDROXY (VIT D DEFICIENCY, FRACTURES): VITD: 54.22 ng/mL (ref 30.00–100.00)

## 2020-02-14 LAB — PSA: PSA: 1.28 ng/mL (ref 0.10–4.00)

## 2020-02-14 MED ORDER — OLMESARTAN MEDOXOMIL 40 MG PO TABS: 40.0000 mg | ORAL_TABLET | Freq: Every day | ORAL | 3 refills | Status: DC

## 2020-02-14 MED ORDER — ROSUVASTATIN CALCIUM 40 MG PO TABS: 40.0000 mg | ORAL_TABLET | Freq: Every day | ORAL | 3 refills | Status: DC

## 2020-02-14 NOTE — Progress Notes (Signed)
Patient: [name] MRN: [id number] DOB: February 17, 1936 PCP: No primary care provider on file.     Subjective:  Chief Complaint  Patient presents with  . Establish Care    New Patient Visit  . Hypertension  . Hyperlipidemia  . Chronic Kidney Disease    HPI: The patient is a 84 y.o. male who presents today for Transition of Care. Pt has no concerns today. Hx ov CVA 8 years ago, HTN, hyperlipidemia, prediabetes, COPD, CKD stage 3A, BPH, former smoker and thrombocytopenia.   Hypertension Hypertension: Here for follow up of hypertension.  Currently on  benicar 40mg /day. Home readings range from 546-568 LEXNTZGY/17-49 diastolic. Takes medication as prescribed and denies any side effects. Exercise includes gardening, walking on treadmill daily. Weight has been stable. Denies any chest pain, headaches, shortness of breath, vision changes, swelling in lower extremities.   Hyperlipidemia -currently on 40mg  of crestor. Needs a refill of this. Has had CVA in the past, about 8 years ago.   CKD Followed by [name]. Just saw them. Unsure what baseline is for this. Checking today. No NSAIDs. He isn't sure why he has CKD.   Prediabetes Never has been on medication. Last a1c I can see is 6.1.   Review of Systems  Respiratory: Negative for cough, shortness of breath and wheezing.   Cardiovascular: Negative for palpitations and leg swelling.  Genitourinary: Negative for frequency, testicular pain and urgency.  Musculoskeletal: Negative for back pain and neck pain.  Neurological: Negative for dizziness, light-headedness and headaches.    Allergies Patient is allergic to ace inhibitors.  Past Medical History Patient  has a past medical history of BPH (benign prostatic hyperplasia), COPD (chronic obstructive pulmonary disease) ([name]), CVA (cerebral vascular accident) ([name]), Diabetes mellitus without complication ([name]), Dizziness and giddiness (07/09/2015), Hyperlipidemia,  Hypertension, and Renal disorder.  Surgical History Patient  has a past surgical history that includes Appendectomy and TEE without cardioversion (03/10/2012).  Family History Pateint's family history includes Cancer in his mother and sister; Heart attack in his brother, brother, and father; Heart disease in his brother.  Social History Patient  reports that he has quit smoking. He has never used smokeless tobacco. He reports current alcohol use. He reports that he does not use drugs.    Objective: Vitals:   02/14/20 1028  BP: 102/80  Pulse: 68  Temp: 98.2 F (36.8 C)  TempSrc: Temporal  SpO2: 99%  Weight: 156 lb 9.6 oz (71 kg)  Height: 5\' 5"  (1.651 m)    Body mass index is 26.06 kg/m.  Physical Exam Vitals reviewed.  Constitutional:      Appearance: Normal appearance. He is well-developed and normal weight.  HENT:     Head: Normocephalic and atraumatic.     Right Ear: Tympanic membrane, ear canal and external ear normal.     Left Ear: Tympanic membrane, ear canal and external ear normal.     Mouth/Throat:     Comments: Dentures upper and lower.  Eyes:     Extraocular Movements: Extraocular movements intact.     Conjunctiva/sclera: Conjunctivae normal.     Pupils: Pupils are equal, round, and reactive to light.  Neck:     Thyroid: No thyromegaly.     Vascular: No carotid bruit.  Cardiovascular:     Rate and Rhythm: Normal rate and regular rhythm.     Heart sounds: Murmur (right second intercostal space. systolic ) present.  Pulmonary:     Effort: Pulmonary effort is normal.  Breath sounds: Normal breath sounds.  Abdominal:     General: Abdomen is flat. Bowel sounds are normal. There is no distension.     Palpations: Abdomen is soft.     Tenderness: There is no abdominal tenderness.  Musculoskeletal:     Cervical back: Normal range of motion and neck supple.  Lymphadenopathy:     Cervical: No cervical adenopathy.  Skin:    General: Skin is warm and dry.      Capillary Refill: Capillary refill takes less than 2 seconds.     Findings: No rash.  Neurological:     General: No focal deficit present.     Mental Status: He is alert and oriented to person, place, and time.     Cranial Nerves: No cranial nerve deficit.     Coordination: Coordination normal.     Deep Tendon Reflexes: Reflexes normal.  Psychiatric:        Mood and Affect: Mood normal.        Behavior: Behavior normal.      Office Visit from 02/14/2020 in [name]  PHQ-2 Total Score  0         Assessment/plan:  Hm/problem list/med list reviewed and updated.   1. Essential hypertension Blood pressure is to goal. Continue current anti-hypertensive medication per hpi. Refills given and routine lab work will be done today. Recommended routine exercise and healthy diet including DASH diet and mediterranean diet. Encouraged weight loss. F/u in 6 months.   - CBC with Differential/Platelet - Comprehensive metabolic panel - Microalbumin / creatinine urine ratio  2. Mixed hyperlipidemia Refilled crestor. Labs today.  - Lipid panel  3. Vitamin D deficiency Requesting a refill but I have no idea what he is on. Discussed I usually do otc if on maintenance, so will check level today.  - VITAMIN D 25 Hydroxy (Vit-D Deficiency, Fractures)  4. Benign prostatic hyperplasia without lower urinary tract symptoms Stopped all medication. Unsure if he did this or done by previous provider. No urinary symptoms.  - PSA  5. Pre-diabetes  - Hemoglobin A1c  6. Stage 3a chronic kidney disease Followed [name].   7. Murmur, cardiac They have not heard of this. Last echo in 2016. He is asymptomatic so they would like to hold off on any testing (echo).     This visit occurred during the SARS-CoV-2 public health emergency.  Safety protocols were in place, including screening questions prior to the visit, additional usage of staff PPE, and extensive  cleaning of exam room while observing appropriate contact time as indicated for disinfecting solutions.     Return in about 6 months (around 08/15/2020) for routine f/u for htn/prediabetes/ckd.    Orland Mustard, MD [facility]   02/14/2020

## 2020-02-18 ENCOUNTER — Ambulatory Visit: Payer: Medicare HMO | Admitting: Family Medicine

## 2020-02-19 ENCOUNTER — Ambulatory Visit
Admission: RE | Admit: 2020-02-19 | Discharge: 2020-02-19 | Disposition: A | Discharge status: Home / Self Care | Payer: Medicare HMO | Source: Ambulatory Visit | Attending: Nephrology | Admitting: Nephrology

## 2020-02-19 DIAGNOSIS — N1831 Chronic kidney disease, stage 3a: Secondary | ICD-10-CM

## 2020-05-16 ENCOUNTER — Ambulatory Visit (INDEPENDENT_AMBULATORY_CARE_PROVIDER_SITE_OTHER): Payer: Medicare HMO

## 2020-05-16 ENCOUNTER — Other Ambulatory Visit: Payer: Self-pay

## 2020-05-16 DIAGNOSIS — Z0000 Encounter for general adult medical examination without abnormal findings: Secondary | ICD-10-CM

## 2020-05-16 NOTE — Progress Notes (Signed)
Virtual Visit via Telephone Note  I connected with  [name] on 05/16/20 at 11:00 AM EDT by telephone and verified that I am speaking with the correct person using two identifiers.  Medicare Annual Wellness visit completed telephonically due to Covid-19 pandemic.   Persons participating in this call: This Health Coach, patient and wife [name]  Location: Patient: Home Provider: Office   I discussed the limitations, risks, security and privacy concerns of performing an evaluation and management service by telephone and the availability of in person appointments. The patient expressed understanding and agreed to proceed.  Unable to perform video visit due to video visit attempted and failed and/or patient does not have video capability.   Some vital signs may be absent or patient reported.   Marzella Schleinina H Samanvitha Germany, LPN    Subjective:   [name] is a 84 y.o. male who presents for an Initial Medicare Annual Wellness Visit.  Review of Systems     Cardiac Risk Factors include: dyslipidemia;male gender;hypertension     Objective:    There were no vitals filed for this visit. There is no height or weight on file to calculate BMI.  Advanced Directives 05/16/2020 07/09/2015 05/11/2015 03/02/2012  Does Patient Have a Medical Advance Directive? No No No Patient does not have advance directive;Patient would not like information  Would patient like information on creating a medical advance directive? - - No - patient declined information -    Current Medications (verified) Outpatient Encounter Medications as of 05/16/2020  Medication Sig   aspirin EC 81 MG tablet Take 1 tablet (81 mg total) by mouth daily.   calcium-vitamin D (OSCAL WITH D) 500-200 MG-UNIT per tablet Take 1 tablet by mouth daily with breakfast.   Cholecalciferol 50 MCG (2000 UT) CAPS Take by mouth.   loratadine (CLARITIN) 10 MG tablet Take 10 mg by mouth daily.   Multiple Vitamin (MULTIVITAMIN) tablet  Take 1 tablet by mouth daily.   olmesartan (BENICAR) 40 MG tablet Take 1 tablet (40 mg total) by mouth daily.   olopatadine (PATANOL) 0.1 % ophthalmic solution Place 1 drop into both eyes 2 (two) times daily.   Omega-3 Fatty Acids (FISH OIL) 1000 MG CAPS Take 1 capsule by mouth daily.   rosuvastatin (CRESTOR) 40 MG tablet Take 1 tablet (40 mg total) by mouth daily.   fluticasone (FLONASE) 50 MCG/ACT nasal spray Place 2 sprays into both nostrils daily. (Patient not taking: Reported on 05/16/2020)   No facility-administered encounter medications on file as of 05/16/2020.    Allergies (verified) Ace inhibitors   History: Past Medical History:  Diagnosis Date   BPH (benign prostatic hyperplasia)    COPD (chronic obstructive pulmonary disease) ([facility])    CVA (cerebral vascular accident) ([facility])    Diabetes mellitus without complication ([facility])    Dizziness and giddiness 07/09/2015   Hyperlipidemia    Hypertension    Renal disorder    Past Surgical History:  Procedure Laterality Date   APPENDECTOMY     TEE WITHOUT CARDIOVERSION  03/10/2012   Procedure: TRANSESOPHAGEAL ECHOCARDIOGRAM (TEE);  Surgeon: Ricki RodriguezAjay S Kadakia, MD;  [facility];  Service: Cardiovascular;  Laterality: N/A;   Family History  Problem Relation Age of Onset   Heart attack Father    Cancer Mother    Cancer Sister        brain tumor   Heart attack Brother    Heart attack Brother    Heart disease Brother    Social History   Socioeconomic  History   Marital status: Married    Spouse name: Not on file   Number of children: 6   Years of education: HS   Highest education level: Not on file  Occupational History   Occupation: retired  Tobacco Use   Smoking status: Former Smoker   Smokeless tobacco: Never Used  Substance and Sexual Activity   Alcohol use: Yes    Alcohol/week: 0.0 standard drinks    Comment: occasionally   Drug use: No   Sexual activity: Not on file  Other  Topics Concern   Not on file  Social History Narrative   Patient drinks caffeine occasionally.   Patient is right handed.    Social Determinants of Health   Financial Resource Strain: Low Risk    Difficulty of Paying Living Expenses: Not hard at all  Food Insecurity: No Food Insecurity   Worried About Programme researcher, broadcasting/film/video in the Last Year: Never true   Ran Out of Food in the Last Year: Never true  Transportation Needs: No Transportation Needs   Lack of Transportation (Medical): No   Lack of Transportation (Non-Medical): No  Physical Activity: Insufficiently Active   Days of Exercise per Week: 3 days   Minutes of Exercise per Session: 30 min  Stress: No Stress Concern Present   Feeling of Stress : Not at all  Social Connections: Moderately Isolated   Frequency of Communication with Friends and Family: More than three times a week   Frequency of Social Gatherings with Friends and Family: Three times a week   Attends Religious Services: Never   Active Member of Clubs or Organizations: No   Attends Banker Meetings: Never   Marital Status: Married    Tobacco Counseling Counseling given: Not Answered   Clinical Intake:  Pre-visit preparation completed: Yes  Pain : No/denies pain     BMI - recorded: 26.06 Nutritional Status: BMI 25 -29 Overweight Nutritional Risks: None Diabetes: No CBG done?: No Did pt. bring in CBG monitor from home?: No  How often do you need to have someone help you when you read instructions, pamphlets, or other written materials from your doctor or pharmacy?: 1 - Never  Diabetic?Yes  Interpreter Needed?: No  Information entered by :: Lanier Ensign, LPN   Activities of Daily Living In your present state of health, do you have any difficulty performing the following activities: 05/16/2020 02/14/2020  Hearing? N N  Vision? N N  Difficulty concentrating or making decisions? N N  Walking or climbing stairs? N N    Dressing or bathing? N N  Doing errands, shopping? N N  Preparing Food and eating ? N -  Using the Toilet? N -  In the past six months, have you accidently leaked urine? N -  Managing your Medications? N -  Managing your Finances? N -  Housekeeping or managing your Housekeeping? N -  Some recent data might be hidden    Patient Care Team: Orland Mustard, MD as PCP - General (Family Medicine)  Indicate any recent Medical Services you may have received from other than [facility] providers in the past year (date may be approximate).     Assessment:   This is a routine wellness examination for [name].  Hearing/Vision screen  Hearing Screening   125Hz  250Hz  500Hz  1000Hz  2000Hz  3000Hz  4000Hz  6000Hz  8000Hz   Right ear:           Left ear:           Comments:  Pt denies any difficulty  hearing at this time  Vision Screening Comments: My eye Dr follows up annually   Dietary issues and exercise activities discussed: Current Exercise Habits: The patient does not participate in regular exercise at present  Goals     Patient Stated     Not at this time      Depression Screen PHQ 2/9 Scores 05/16/2020 02/14/2020  PHQ - 2 Score 0 0    Fall Risk Fall Risk  05/16/2020 02/14/2020  Falls in the past year? 0 0  Number falls in past yr: 0 -  Injury with Fall? 0 -  Follow up Falls prevention discussed -    Any stairs in or around the home? Yes  If so, are there any without handrails? No  Home free of loose throw rugs in walkways, pet beds, electrical cords, etc? Yes  Adequate lighting in your home to reduce risk of falls? Yes   ASSISTIVE DEVICES UTILIZED TO PREVENT FALLS:  Life alert? No  Use of a cane, walker or w/c? No  Grab bars in the bathroom? Yes  Shower chair or bench in shower? No  Elevated toilet seat or a handicapped toilet? No   TIMED UP AND GO:  Was the test performed? No .    Cognitive Function:Declined 6CIT        Immunizations Immunization History  Administered  Date(s) Administered   PFIZER SARS-COV-2 Vaccination 10/20/2019, 11/11/2019   Pneumococcal Conjugate-13 11/25/2014   Pneumococcal Polysaccharide-23 09/13/2008, 06/17/2016   Tdap 01/21/2014   Zoster 01/21/2014    TDAP status: Up to date Flu Vaccine status: Declined, Education has been provided regarding the importance of this vaccine but patient still declined. Advised may receive this vaccine at local pharmacy or Health Dept. Aware to provide a copy of the vaccination record if obtained from local pharmacy or Health Dept. Verbalized acceptance and understanding. Pneumococcal vaccine status: Up to date Covid-19 vaccine status: Completed vaccines  Qualifies for Shingles Vaccine? Yes   Zostavax completed Yes   Shingrix Completed?: No.    Education has been provided regarding the importance of this vaccine. Patient has been advised to call insurance company to determine out of pocket expense if they have not yet received this vaccine. Advised may also receive vaccine at local pharmacy or Health Dept. Verbalized acceptance and understanding.  Screening Tests Health Maintenance  Topic Date Due   FOOT EXAM  Never done   OPHTHALMOLOGY EXAM  Never done   INFLUENZA VACCINE  Never done   HEMOGLOBIN A1C  08/15/2020   TETANUS/TDAP  01/22/2024   COVID-19 Vaccine  Completed   PNA vac Low Risk Adult  Completed    Health Maintenance  Health Maintenance Due  Topic Date Due   FOOT EXAM  Never done   OPHTHALMOLOGY EXAM  Never done   INFLUENZA VACCINE  Never done    Colorectal cancer screening: No longer required.   Additional Screening:   Vision Screening: Recommended annual ophthalmology exams for early detection of glaucoma and other disorders of the eye. Is the patient up to date with their annual eye exam?  Yes  Who is the provider or what is the name of the office in which the patient attends annual eye exams? [facility]   Dental Screening: Recommended annual dental  exams for proper oral hygiene  Community Resource Referral / Chronic Care Management: CRR required this visit?  No   CCM required this visit?  No  Plan:     I have personally reviewed and noted the following in the patients chart:    Medical and social history  Use of alcohol, tobacco or illicit drugs   Current medications and supplements  Functional ability and status  Nutritional status  Physical activity  Advanced directives  List of other physicians  Hospitalizations, surgeries, and ER visits in previous 12 months  Vitals  Screenings to include cognitive, depression, and falls  Referrals and appointments  In addition, I have reviewed and discussed with patient certain preventive protocols, quality metrics, and best practice recommendations. A written personalized care plan for preventive services as well as general preventive health recommendations were provided to patient.     Marzella Schlein, LPN   09/18/1094   Nurse Notes: None

## 2020-05-16 NOTE — Patient Instructions (Addendum)
[name] , Thank you for taking time to come for your Medicare Wellness Visit. I appreciate your ongoing commitment to your health goals. Please review the following plan we discussed and let me know if I can assist you in the future.   Screening recommendations/referrals: Colonoscopy: No longer required Recommended yearly ophthalmology/optometry visit for glaucoma screening and checkup Recommended yearly dental visit for hygiene and checkup  Vaccinations: Influenza vaccine: Due and discussed Pneumococcal vaccine: Up to date Tdap vaccine: Up to date Shingles vaccine: Shingrix discussed. Please contact your pharmacy for coverage information.    Covid-19: Completed 2/6 & 11/11/19  Advanced directives: Advance directive discussed with you today. Even though you declined this today please call our office should you change your mind and we can give you the proper paperwork for you to fill out.  Conditions/risks identified: None at this time  Next appointment: Follow up in one year for your annual wellness visit.   Preventive Care 82 Years and Older, Male Preventive care refers to lifestyle choices and visits with your health care provider that can promote health and wellness. What does preventive care include?  A yearly physical exam. This is also called an annual well check.  Dental exams once or twice a year.  Routine eye exams. Ask your health care provider how often you should have your eyes checked.  Personal lifestyle choices, including:  Daily care of your teeth and gums.  Regular physical activity.  Eating a healthy diet.  Avoiding tobacco and drug use.  Limiting alcohol use.  Practicing safe sex.  Taking low doses of aspirin every day.  Taking vitamin and mineral supplements as recommended by your health care provider. What happens during an annual well check? The services and screenings done by your health care provider during your annual well check will  depend on your age, overall health, lifestyle risk factors, and family history of disease. Counseling  Your health care provider may ask you questions about your:  Alcohol use.  Tobacco use.  Drug use.  Emotional well-being.  Home and relationship well-being.  Sexual activity.  Eating habits.  History of falls.  Memory and ability to understand (cognition).  Work and work Astronomer. Screening  You may have the following tests or measurements:  Height, weight, and BMI.  Blood pressure.  Lipid and cholesterol levels. These may be checked every 5 years, or more frequently if you are over 82 years old.  Skin check.  Lung cancer screening. You may have this screening every year starting at age 82 if you have a 30-pack-year history of smoking and currently smoke or have quit within the past 15 years.  Fecal occult blood test (FOBT) of the stool. You may have this test every year starting at age 82.  Flexible sigmoidoscopy or colonoscopy. You may have a sigmoidoscopy every 5 years or a colonoscopy every 10 years starting at age 82.  Prostate cancer screening. Recommendations will vary depending on your family history and other risks.  Hepatitis C blood test.  Hepatitis B blood test.  Sexually transmitted disease (STD) testing.  Diabetes screening. This is done by checking your blood sugar (glucose) after you have not eaten for a while (fasting). You may have this done every 1-3 years.  Abdominal aortic aneurysm (AAA) screening. You may need this if you are a current or former smoker.  Osteoporosis. You may be screened starting at age 82 if you are at high risk. Talk with your health care provider about your  test results, treatment options, and if necessary, the need for more tests. Vaccines  Your health care provider may recommend certain vaccines, such as:  Influenza vaccine. This is recommended every year.  Tetanus, diphtheria, and acellular pertussis (Tdap,  Td) vaccine. You may need a Td booster every 10 years.  Zoster vaccine. You may need this after age 82.  Pneumococcal 13-valent conjugate (PCV13) vaccine. One dose is recommended after age 82.  Pneumococcal polysaccharide (PPSV23) vaccine. One dose is recommended after age 82. Talk to your health care provider about which screenings and vaccines you need and how often you need them. This information is not intended to replace advice given to you by your health care provider. Make sure you discuss any questions you have with your health care provider. Document Released: 09/26/2015 Document Revised: 05/19/2016 Document Reviewed: 07/01/2015 Elsevier Interactive Patient Education  2017 [place] Prevention in the Home Falls can cause injuries. They can happen to people of all ages. There are many things you can do to make your home safe and to help prevent falls. What can I do on the outside of my home?  Regularly fix the edges of walkways and driveways and fix any cracks.  Remove anything that might make you trip as you walk through a door, such as a raised step or threshold.  Trim any bushes or trees on the path to your home.  Use bright outdoor lighting.  Clear any walking paths of anything that might make someone trip, such as rocks or tools.  Regularly check to see if handrails are loose or broken. Make sure that both sides of any steps have handrails.  Any raised decks and porches should have guardrails on the edges.  Have any leaves, snow, or ice cleared regularly.  Use sand or salt on walking paths during winter.  Clean up any spills in your garage right away. This includes oil or grease spills. What can I do in the bathroom?  Use night lights.  Install grab bars by the toilet and in the tub and shower. Do not use towel bars as grab bars.  Use non-skid mats or decals in the tub or shower.  If you need to sit down in the shower, use a plastic, non-slip  stool.  Keep the floor dry. Clean up any water that spills on the floor as soon as it happens.  Remove soap buildup in the tub or shower regularly.  Attach bath mats securely with double-sided non-slip rug tape.  Do not have throw rugs and other things on the floor that can make you trip. What can I do in the bedroom?  Use night lights.  Make sure that you have a light by your bed that is easy to reach.  Do not use any sheets or blankets that are too big for your bed. They should not hang down onto the floor.  Have a firm chair that has side arms. You can use this for support while you get dressed.  Do not have throw rugs and other things on the floor that can make you trip. What can I do in the kitchen?  Clean up any spills right away.  Avoid walking on wet floors.  Keep items that you use a lot in easy-to-reach places.  If you need to reach something above you, use a strong step stool that has a grab bar.  Keep electrical cords out of the way.  Do not use floor polish or wax that  makes floors slippery. If you must use wax, use non-skid floor wax.  Do not have throw rugs and other things on the floor that can make you trip. What can I do with my stairs?  Do not leave any items on the stairs.  Make sure that there are handrails on both sides of the stairs and use them. Fix handrails that are broken or loose. Make sure that handrails are as long as the stairways.  Check any carpeting to make sure that it is firmly attached to the stairs. Fix any carpet that is loose or worn.  Avoid having throw rugs at the top or bottom of the stairs. If you do have throw rugs, attach them to the floor with carpet tape.  Make sure that you have a light switch at the top of the stairs and the bottom of the stairs. If you do not have them, ask someone to add them for you. What else can I do to help prevent falls?  Wear shoes that:  Do not have high heels.  Have rubber bottoms.  Are  comfortable and fit you well.  Are closed at the toe. Do not wear sandals.  If you use a stepladder:  Make sure that it is fully opened. Do not climb a closed stepladder.  Make sure that both sides of the stepladder are locked into place.  Ask someone to hold it for you, if possible.  Clearly mark and make sure that you can see:  Any grab bars or handrails.  First and last steps.  Where the edge of each step is.  Use tools that help you move around (mobility aids) if they are needed. These include:  Canes.  Walkers.  Scooters.  Crutches.  Turn on the lights when you go into a dark area. Replace any light bulbs as soon as they burn out.  Set up your furniture so you have a clear path. Avoid moving your furniture around.  If any of your floors are uneven, fix them.  If there are any pets around you, be aware of where they are.  Review your medicines with your doctor. Some medicines can make you feel dizzy. This can increase your chance of falling. Ask your doctor what other things that you can do to help prevent falls. This information is not intended to replace advice given to you by your health care provider. Make sure you discuss any questions you have with your health care provider. Document Released: 06/26/2009 Document Revised: 02/05/2016 Document Reviewed: 10/04/2014 Elsevier Interactive Patient Education  2017 [employer].

## 2020-08-15 ENCOUNTER — Ambulatory Visit (INDEPENDENT_AMBULATORY_CARE_PROVIDER_SITE_OTHER): Payer: Medicare HMO | Admitting: Family Medicine

## 2020-08-15 ENCOUNTER — Other Ambulatory Visit: Payer: Self-pay

## 2020-08-15 ENCOUNTER — Encounter: Payer: Self-pay | Admitting: Family Medicine

## 2020-08-15 VITALS — BP 160/90 | HR 73 | Temp 97.2°F | Ht 65.0 in | Wt 161.2 lb

## 2020-08-15 DIAGNOSIS — Z23 Encounter for immunization: Secondary | ICD-10-CM

## 2020-08-15 DIAGNOSIS — N1831 Chronic kidney disease, stage 3a: Secondary | ICD-10-CM | POA: Diagnosis not present

## 2020-08-15 DIAGNOSIS — I10 Essential (primary) hypertension: Secondary | ICD-10-CM

## 2020-08-15 DIAGNOSIS — R7303 Prediabetes: Secondary | ICD-10-CM | POA: Diagnosis not present

## 2020-08-15 MED ORDER — AMLODIPINE BESYLATE 5 MG PO TABS
5.0000 mg | ORAL_TABLET | Freq: Every day | ORAL | 3 refills | Status: DC
Start: 2020-08-15 — End: 2021-06-02

## 2020-08-15 NOTE — Progress Notes (Signed)
Patient: [name] MRN: [id number] DOB: 02-21-1936 PCP: Orland Mustard, MD     Subjective:  Chief Complaint  Patient presents with  . Hypertension  . Prediabetes  . Chronic Kidney Disease    HPI: The patient is a 84 y.o. male who presents today for HTN, Prediabetes, CKD. Pt says  that he does not know if the Olmesartan is helping. His readings have been elevated. He is requesting flu shot today.  Hypertension: Here for follow up of hypertension.  Currently on  benicar 40mg /day. Home readings range from  systolic 150-170/70-80 diastolic. Takes- medication as prescribed and denies any side effects. Exercise includes gardening, walking on treadmill daily. Weight has been stable. Denies any chest pain, headaches, shortness of breath, vision changes, swelling in lower extremities. he didn't take his medication today. He started noticing his pressures going up over the last month.   Prediabetes Never has been on medication. Last a1c I can see is 6.1.   CKD Last labs were wnl. GFR >60. Has seen kidney. Repeat labs today.   Review of Systems  Constitutional: Negative for chills, fatigue and fever.  Eyes: Negative for visual disturbance.  Respiratory: Negative for chest tightness and shortness of breath.   Cardiovascular: Negative for chest pain and palpitations.  Gastrointestinal: Negative for abdominal pain, diarrhea, nausea and vomiting.  Neurological: Negative for dizziness and headaches.    Allergies Patient is allergic to ace inhibitors.  Past Medical History Patient  has a past medical history of BPH (benign prostatic hyperplasia), COPD (chronic obstructive pulmonary disease) (HCC), CVA (cerebral vascular accident) (HCC), Diabetes mellitus without complication (HCC), Dizziness and giddiness (07/09/2015), Hyperlipidemia, Hypertension, and Renal disorder.  Surgical History Patient  has a past surgical history that includes Appendectomy and TEE without  cardioversion (03/10/2012).  Family History Pateint's family history includes Cancer in his mother and sister; Heart attack in his brother, brother, and father; Heart disease in his brother.  Social History Patient  reports that he has quit smoking. He has never used smokeless tobacco. He reports current alcohol use. He reports that he does not use drugs.    Objective: Vitals:   08/15/20 1006 08/15/20 1034  BP: (!) 185/120 (!) 160/90  Pulse: 73   Temp: (!) 97.2 F (36.2 C)   TempSrc: Temporal   SpO2: 98%   Weight: 161 lb 3.2 oz (73.1 kg)   Height: 5\' 5"  (1.651 m)     Body mass index is 26.83 kg/m.  Physical Exam Vitals reviewed.  Constitutional:      Appearance: Normal appearance. He is well-developed and normal weight.  HENT:     Head: Normocephalic and atraumatic.     Right Ear: Tympanic membrane, ear canal and external ear normal.     Left Ear: Tympanic membrane, ear canal and external ear normal.  Eyes:     Conjunctiva/sclera: Conjunctivae normal.     Pupils: Pupils are equal, round, and reactive to light.  Neck:     Thyroid: No thyromegaly.     Vascular: No carotid bruit.  Cardiovascular:     Rate and Rhythm: Normal rate and regular rhythm.     Heart sounds: Normal heart sounds. No murmur heard.   Pulmonary:     Effort: Pulmonary effort is normal.     Breath sounds: Normal breath sounds.  Abdominal:     General: Bowel sounds are normal. There is no distension.     Palpations: Abdomen is soft.     Tenderness: There is no abdominal  tenderness.  Musculoskeletal:     Cervical back: Normal range of motion and neck supple.  Lymphadenopathy:     Cervical: No cervical adenopathy.  Skin:    General: Skin is warm and dry.     Findings: No rash.  Neurological:     General: No focal deficit present.     Mental Status: He is alert and oriented to person, place, and time.     Cranial Nerves: No cranial nerve deficit.     Coordination: Coordination normal.     Deep  Tendon Reflexes: Reflexes normal.  Psychiatric:        Mood and Affect: Mood normal.        Behavior: Behavior normal.        Assessment/plan: 1. Pre-diabetes  - Hemoglobin A1c; Future  2. Primary hypertension Home readings above goal and above goal today. Adding on norvasc 5mg /day  daily and continue benicar 40mg . Continue home log. Routine labs today and f/u in one month. I asked that he call me in a few weeks with readings as we may need to bump up his norvasc to 10mg /day. htn urgency/emergency precautions given.  - CBC with Differential/Platelet; Future  3. Stage 3a chronic kidney disease (HCC)  - COMPLETE METABOLIC PANEL WITH GFR; Future  4. Flu shot today    This visit occurred during the SARS-CoV-2 public health emergency.  Safety protocols were in place, including screening questions prior to the visit, additional usage of staff PPE, and extensive cleaning of exam room while observing appropriate contact time as indicated for disinfecting solutions.     Return in about 1 month (around 09/15/2020) for hypertension check up. , MD [place] [facility]   08/15/2020

## 2020-08-15 NOTE — Patient Instructions (Addendum)
Going to start you on a drug for your blood pressure called amlodipine. It will be at 5mg /day. CONTINUE your benicar 40mg /day.  Call me before you leave for [place] so I can adjust your meds if needed.   See you back in 1 month  -start vitamin D3 2000IU/day -can take vitamin C 500mg /day.

## 2020-08-16 LAB — CBC WITH DIFFERENTIAL/PLATELET
Absolute Monocytes: 375 cells/uL (ref 200–950)
Basophils Absolute: 22 cells/uL (ref 0–200)
Basophils Relative: 0.4 %
Eosinophils Absolute: 151 cells/uL (ref 15–500)
Eosinophils Relative: 2.7 %
HCT: 42.2 % (ref 38.5–50.0)
Hemoglobin: 13.5 g/dL (ref 13.2–17.1)
Lymphs Abs: 1002 cells/uL (ref 850–3900)
MCH: 29.2 pg (ref 27.0–33.0)
MCHC: 32 g/dL (ref 32.0–36.0)
MCV: 91.1 fL (ref 80.0–100.0)
MPV: 12.3 fL (ref 7.5–12.5)
Monocytes Relative: 6.7 %
Neutro Abs: 4049 cells/uL (ref 1500–7800)
Neutrophils Relative %: 72.3 %
Platelets: 101 10*3/uL — ABNORMAL LOW (ref 140–400)
RBC: 4.63 10*6/uL (ref 4.20–5.80)
RDW: 13.8 % (ref 11.0–15.0)
Total Lymphocyte: 17.9 %
WBC: 5.6 10*3/uL (ref 3.8–10.8)

## 2020-08-16 LAB — COMPLETE METABOLIC PANEL WITH GFR
AG Ratio: 1.5 (calc) (ref 1.0–2.5)
ALT: 15 U/L (ref 9–46)
AST: 21 U/L (ref 10–35)
Albumin: 4.2 g/dL (ref 3.6–5.1)
Alkaline phosphatase (APISO): 72 U/L (ref 35–144)
BUN/Creatinine Ratio: 15 (calc) (ref 6–22)
BUN: 17 mg/dL (ref 7–25)
CO2: 30 mmol/L (ref 20–32)
Calcium: 9.5 mg/dL (ref 8.6–10.3)
Chloride: 101 mmol/L (ref 98–110)
Creat: 1.13 mg/dL — ABNORMAL HIGH (ref 0.70–1.11)
GFR, Est African American: 70 mL/min/{1.73_m2} (ref >=60)
GFR, Est Non African American: 60 mL/min/{1.73_m2} (ref >=60)
Globulin: 2.8 g/dL (calc) (ref 1.9–3.7)
Glucose, Bld: 128 mg/dL — ABNORMAL HIGH (ref 65–99)
Potassium: 4 mmol/L (ref 3.5–5.3)
Sodium: 139 mmol/L (ref 135–146)
Total Bilirubin: 0.7 mg/dL (ref 0.2–1.2)
Total Protein: 7 g/dL (ref 6.1–8.1)

## 2020-08-16 LAB — HEMOGLOBIN A1C
Hgb A1c MFr Bld: 6.3 % of total Hgb — ABNORMAL HIGH (ref <5.7)
Mean Plasma Glucose: 134 (calc)
eAG (mmol/L): 7.4 (calc)

## 2020-09-02 ENCOUNTER — Telehealth: Payer: Self-pay

## 2020-09-02 NOTE — Telephone Encounter (Signed)
Unable to LVM for patient.   Nurse Assessment Nurse: D'Heur Ezzard Standing, RN, Adrienne Date/Time (Eastern Time): 09/01/2020 11:06:05 AM Confirm and document reason for call. If symptomatic, describe symptoms. ---Caller states he has high blood pressure. Started new med on December 9-Amlodipine 5mg  daily. Today's BP 139/84, 59 pulse. Took med at 10:00 this morning. No symptoms currently. Does the patient have any new or worsening symptoms? ---No Guidelines Guideline Title Affirmed Question Affirmed Notes Nurse Date/Time (Eastern Time) Blood Pressure - High [1] Systolic BP >= 130 OR Diastolic >= 80 AND [2] taking BP medications D'Heur , RN, Ezzard Standing 09/01/2020 11:13:29 AM Disp. Time 09/03/2020 Time) Disposition Final User 09/01/2020 11:16:09 AM See PCP within 2 Weeks Yes D'Heur 09/03/2020, RN, Ezzard Standing Disagree/Comply Comply Caller Understands Yes PreDisposition Call Doctor Care Advice Given Per Guideline PLEASE NOTE: All timestamps contained within this report are represented as [place] Standard Time. CONFIDENTIALTY NOTICE: This fax transmission is intended only for the addressee. It contains information that is legally privileged, confidential or otherwise protected from use or disclosure. If you are not the intended recipient, you are strictly prohibited from reviewing, disclosing, copying using or disseminating any of this information or taking any action in reliance on or regarding this information. If you have received this fax in error, please notify [place] immediately by telephone so that we can arrange for its return to [place]. Phone: [phone], Toll-Free: [phone], Fax: [phone] Page: 2 of 2 Call Id: [phone] Care Advice Given Per Guideline SEE PCP WITHIN 2 WEEKS: REASSURANCE AND EDUCATION: * Your blood pressure is elevated but you have told me that you are not having any symptoms. * You become worse CALL BACK IF: * Weakness or numbness of the face, arm or leg on  one side of the body occurs CARE ADVICE given per High Blood Pressure (Adult) guideline. Comments User: [id number], D'Heur Hansel Starling, RN Date/Time Ezzard Standing Time): 09/01/2020 11:19:34 AM Caller states he has an appointment on Jan 12 to follow up with new medication. Explained to him that amlodipine will usually begin working within 2-4 hours but it may take 3-4 weeks for the full effects to kick in. Recommended he take BP BID and keep a record of this. Also discussed with him talking with PCP to see if he should continue taking in the morning or taking med at night for possibly better effects on BP. Understanding voiced

## 2020-09-02 NOTE — Telephone Encounter (Signed)
FYI

## 2020-09-18 ENCOUNTER — Ambulatory Visit: Payer: Medicare HMO | Admitting: Family Medicine

## 2020-09-23 ENCOUNTER — Other Ambulatory Visit: Payer: Self-pay

## 2020-09-24 ENCOUNTER — Encounter: Payer: Self-pay | Admitting: Family Medicine

## 2020-09-24 ENCOUNTER — Ambulatory Visit (INDEPENDENT_AMBULATORY_CARE_PROVIDER_SITE_OTHER): Payer: Medicare HMO | Admitting: Family Medicine

## 2020-09-24 VITALS — BP 100/78 | HR 82 | Temp 97.8°F | Ht 65.0 in | Wt 159.2 lb

## 2020-09-24 DIAGNOSIS — I10 Essential (primary) hypertension: Secondary | ICD-10-CM

## 2020-09-24 NOTE — Progress Notes (Signed)
Patient: [name] MRN: [id number] DOB: 1936/09/12 PCP: Orland Mustard, MD     Subjective:  Chief Complaint  Patient presents with  . Hypertension    1 month follow up.    HPI: The patient is a 85 y.o. male who presents today for HTN.  Hypertension: Here for follow up of hypertension.  Currently on benicar 40mg /day and I added on norvasc 5mg /day last month . Home readings range from 115-146 systolic/65-80 diastolic. Takes medication as prescribed and denies any side effects. Exercise includes walking. Weight has been stable. Denies any chest pain, headaches, shortness of breath, vision changes, swelling in lower extremities.   He has no other complaints.   Review of Systems  Constitutional: Negative for chills, fatigue and fever.  HENT: Negative for dental problem, ear pain, hearing loss and trouble swallowing.   Eyes: Negative for visual disturbance.  Respiratory: Negative for cough, chest tightness and shortness of breath.   Cardiovascular: Negative for chest pain, palpitations and leg swelling.  Gastrointestinal: Negative for abdominal pain, blood in stool, diarrhea and nausea.  Endocrine: Negative for cold intolerance, polydipsia, polyphagia and polyuria.  Genitourinary: Negative for dysuria and hematuria.  Musculoskeletal: Negative for arthralgias.  Skin: Negative for rash.  Neurological: Negative for dizziness, numbness and headaches.  Psychiatric/Behavioral: Negative for dysphoric mood and sleep disturbance. The patient is not nervous/anxious.     Allergies Patient is allergic to ace inhibitors.  Past Medical History Patient  has a past medical history of BPH (benign prostatic hyperplasia), COPD (chronic obstructive pulmonary disease) (HCC), CVA (cerebral vascular accident) (HCC), Diabetes mellitus without complication (HCC), Dizziness and giddiness (07/09/2015), Hyperlipidemia, Hypertension, and Renal disorder.  Surgical History Patient  has a past surgical  history that includes Appendectomy and TEE without cardioversion (03/10/2012).  Family History Pateint's family history includes Cancer in his mother and sister; Heart attack in his brother, brother, and father; Heart disease in his brother.  Social History Patient  reports that he has quit smoking. He has never used smokeless tobacco. He reports current alcohol use. He reports that he does not use drugs.    Objective: Vitals:   09/24/20 0948  BP: 100/78  Pulse: 82  Temp: 97.8 F (36.6 C)  TempSrc: Temporal  SpO2: 98%  Weight: 159 lb 3.2 oz (72.2 kg)  Height: 5\' 5"  (1.651 m)    Body mass index is 26.49 kg/m.  Physical Exam Vitals reviewed.  Constitutional:      Appearance: Normal appearance. He is obese.  HENT:     Head: Normocephalic and atraumatic.  Cardiovascular:     Rate and Rhythm: Normal rate and regular rhythm.     Heart sounds: Murmur (right second intercostal space. ) heard.    Pulmonary:     Effort: Pulmonary effort is normal.     Breath sounds: Normal breath sounds.  Abdominal:     General: Bowel sounds are normal.     Palpations: Abdomen is soft.  Skin:    General: Skin is warm.     Capillary Refill: Capillary refill takes less than 2 seconds.  Neurological:     General: No focal deficit present.     Mental Status: He is alert and oriented to person, place, and time.  Psychiatric:        Mood and Affect: Mood normal.        Behavior: Behavior normal.        Assessment/plan: 1. Primary hypertension Blood pressure is to goal. Continue current anti-hypertensive medications of norvasc 5mg /day  and benicar 40mg . Refills not given. Recommended routine exercise and healthy diet including DASH diet and mediterranean diet. Encouraged weight loss. F/u in 6 months.   -flow murmur. Asymptomatic. Continue to monitor. Last echo in 2016.    This visit occurred during the SARS-CoV-2 public health emergency.  Safety protocols were in place, including screening  questions prior to the visit, additional usage of staff PPE, and extensive cleaning of exam room while observing appropriate contact time as indicated for disinfecting solutions.     Return in about 5 months (around 02/22/2021) for routine f/u: prediabetes/htn/chol/ckd .    04/24/2021, MD [place] [facility]   09/24/2020

## 2020-12-29 ENCOUNTER — Other Ambulatory Visit: Payer: Self-pay | Admitting: Family Medicine

## 2021-03-05 DIAGNOSIS — N1831 Chronic kidney disease, stage 3a: Secondary | ICD-10-CM | POA: Diagnosis not present

## 2021-03-05 DIAGNOSIS — I129 Hypertensive chronic kidney disease with stage 1 through stage 4 chronic kidney disease, or unspecified chronic kidney disease: Secondary | ICD-10-CM | POA: Diagnosis not present

## 2021-03-05 DIAGNOSIS — Z8673 Personal history of transient ischemic attack (TIA), and cerebral infarction without residual deficits: Secondary | ICD-10-CM | POA: Diagnosis not present

## 2021-03-05 DIAGNOSIS — R6889 Other general symptoms and signs: Secondary | ICD-10-CM | POA: Diagnosis not present

## 2021-03-05 DIAGNOSIS — E1122 Type 2 diabetes mellitus with diabetic chronic kidney disease: Secondary | ICD-10-CM | POA: Diagnosis not present

## 2021-03-05 DIAGNOSIS — N281 Cyst of kidney, acquired: Secondary | ICD-10-CM | POA: Diagnosis not present

## 2021-03-25 ENCOUNTER — Ambulatory Visit: Payer: Medicare HMO | Admitting: Family Medicine

## 2021-04-16 ENCOUNTER — Other Ambulatory Visit: Payer: Self-pay | Admitting: Family Medicine

## 2021-04-17 ENCOUNTER — Other Ambulatory Visit: Payer: Self-pay

## 2021-05-25 ENCOUNTER — Ambulatory Visit (INDEPENDENT_AMBULATORY_CARE_PROVIDER_SITE_OTHER): Payer: Medicare HMO

## 2021-05-25 ENCOUNTER — Ambulatory Visit: Payer: Medicare HMO

## 2021-05-25 DIAGNOSIS — Z0000 Encounter for general adult medical examination without abnormal findings: Secondary | ICD-10-CM | POA: Diagnosis not present

## 2021-05-25 NOTE — Progress Notes (Signed)
Virtual Visit via Telephone Note  I connected with  [name] on 05/25/21 at  1:00 PM EDT by telephone and verified that I am speaking with the correct person using two identifiers.  Medicare Annual Wellness visit completed telephonically due to Covid-19 pandemic.   Persons participating in this call: This Health Coach and this patient.   Location: Patient: Home Provider: Office   I discussed the limitations, risks, security and privacy concerns of performing an evaluation and management service by telephone and the availability of in person appointments. The patient expressed understanding and agreed to proceed.  Unable to perform video visit due to video visit attempted and failed and/or patient does not have video capability.   Some vital signs may be absent or patient reported.   Marzella Schleinina H Jenavie Stanczak, LPN   Subjective:   [name] is a 85 y.o. male who presents for Medicare Annual/Subsequent preventive examination.  Review of Systems     Cardiac Risk Factors include: advanced age (>3755men, 72>65 women);hypertension;diabetes mellitus;dyslipidemia;male gender     Objective:    There were no vitals filed for this visit. There is no height or weight on file to calculate BMI.  Advanced Directives 05/25/2021 05/16/2020 07/09/2015 05/11/2015 03/02/2012  Does Patient Have a Medical Advance Directive? No No No No Patient does not have advance directive;Patient would not like information  Would patient like information on creating a medical advance directive? No - Patient declined - - No - patient declined information -    Current Medications (verified) Outpatient Encounter Medications as of 05/25/2021  Medication Sig   amLODipine (NORVASC) 5 MG tablet Take 1 tablet (5 mg total) by mouth daily.   aspirin EC 81 MG tablet Take 1 tablet (81 mg total) by mouth daily.   loratadine (CLARITIN) 10 MG tablet Take 10 mg by mouth daily.   Multiple Vitamin (MULTIVITAMIN) tablet Take 1  tablet by mouth daily.   olmesartan (BENICAR) 40 MG tablet TAKE 1 TABLET (40 MG TOTAL) BY MOUTH DAILY.   olopatadine (PATANOL) 0.1 % ophthalmic solution Place 1 drop into both eyes 2 (two) times daily.   rosuvastatin (CRESTOR) 40 MG tablet TAKE 1 TABLET (40 MG TOTAL) BY MOUTH DAILY.   calcium-vitamin D (OSCAL WITH D) 500-200 MG-UNIT per tablet Take 1 tablet by mouth daily with breakfast. (Patient not taking: Reported on 05/25/2021)   Cholecalciferol 50 MCG (2000 UT) CAPS Take by mouth. (Patient not taking: Reported on 05/25/2021)   Omega-3 Fatty Acids (FISH OIL) 1000 MG CAPS Take 1 capsule by mouth daily. (Patient not taking: Reported on 05/25/2021)   PFIZER-BIONT COVID-19 VAC-TRIS SUSP injection  (Patient not taking: Reported on 05/25/2021)   [DISCONTINUED] fluticasone (FLONASE) 50 MCG/ACT nasal spray Place 2 sprays into both nostrils daily. (Patient not taking: No sig reported)   No facility-administered encounter medications on file as of 05/25/2021.    Allergies (verified) Ace inhibitors   History: Past Medical History:  Diagnosis Date   BPH (benign prostatic hyperplasia)    COPD (chronic obstructive pulmonary disease) (HCC)    CVA (cerebral vascular accident) (HCC)    Diabetes mellitus without complication (HCC)    Dizziness and giddiness 07/09/2015   Hyperlipidemia    Hypertension    Renal disorder    Past Surgical History:  Procedure Laterality Date   APPENDECTOMY     TEE WITHOUT CARDIOVERSION  03/10/2012   Procedure: TRANSESOPHAGEAL ECHOCARDIOGRAM (TEE);  Surgeon: Ricki RodriguezAjay S Kadakia, MD;  [facility];  Service: Cardiovascular;  Laterality: N/A;  Family History  Problem Relation Age of Onset   Heart attack Father    Cancer Mother    Cancer Sister        brain tumor   Heart attack Brother    Heart attack Brother    Heart disease Brother    Social History   Socioeconomic History   Marital status: Married    Spouse name: Not on file   Number of children: 6    Years of education: HS   Highest education level: Not on file  Occupational History   Occupation: retired  Tobacco Use   Smoking status: Former   Smokeless tobacco: Never  Substance and Sexual Activity   Alcohol use: Yes    Alcohol/week: 0.0 standard drinks    Comment: occasionally   Drug use: No   Sexual activity: Not on file  Other Topics Concern   Not on file  Social History Narrative   Patient drinks caffeine occasionally.   Patient is right handed.    Social Determinants of Health   Financial Resource Strain: Low Risk    Difficulty of Paying Living Expenses: Not hard at all  Food Insecurity: No Food Insecurity   Worried About Programme researcher, broadcasting/film/video in the Last Year: Never true   Ran Out of Food in the Last Year: Never true  Transportation Needs: No Transportation Needs   Lack of Transportation (Medical): No   Lack of Transportation (Non-Medical): No  Physical Activity: Insufficiently Active   Days of Exercise per Week: 4 days   Minutes of Exercise per Session: 10 min  Stress: No Stress Concern Present   Feeling of Stress : Not at all  Social Connections: Moderately Isolated   Frequency of Communication with Friends and Family: More than three times a week   Frequency of Social Gatherings with Friends and Family: Three times a week   Attends Religious Services: Never   Active Member of Clubs or Organizations: No   Attends Banker Meetings: Never   Marital Status: Married    Tobacco Counseling Counseling given: Not Answered   Clinical Intake:  Pre-visit preparation completed: Yes  Pain : No/denies pain     BMI - recorded: 26.49 Nutritional Status: BMI 25 -29 Overweight Nutritional Risks: None Diabetes: Yes CBG done?: No Did pt. bring in CBG monitor from home?: No  How often do you need to have someone help you when you read instructions, pamphlets, or other written materials from your doctor or pharmacy?: 1 - Never  Diabetic?Nutrition Risk  Assessment:  Has the patient had any N/V/D within the last 2 months?  No  Does the patient have any non-healing wounds?  No  Has the patient had any unintentional weight loss or weight gain?  No   Diabetes:  Is the patient diabetic?  Yes  If diabetic, was a CBG obtained today?  No  Did the patient bring in their glucometer from home?  No  How often do you monitor your CBG's? N/A.   Financial Strains and Diabetes Management:  Are you having any financial strains with the device, your supplies or your medication? No .  Does the patient want to be seen by Chronic Care Management for management of their diabetes?  No  Would the patient like to be referred to a Nutritionist or for Diabetic Management?  No   Diabetic Exams:  Diabetic Eye Exam: Overdue for diabetic eye exam. Pt has been advised about the importance in completing this exam. Patient  advised to call and schedule an eye exam. Diabetic Foot Exam: Overdue, Pt has been advised about the importance in completing this exam. Pt is scheduled for diabetic foot exam on next appt on 06/02/21.   Interpreter Needed?: No  Information entered by :: Lanier Ensign, LPN   Activities of Daily Living In your present state of health, do you have any difficulty performing the following activities: 05/25/2021  Hearing? N  Vision? N  Difficulty concentrating or making decisions? N  Walking or climbing stairs? N  Dressing or bathing? N  Doing errands, shopping? N  Preparing Food and eating ? N  Using the Toilet? N  In the past six months, have you accidently leaked urine? N  Do you have problems with loss of bowel control? N  Managing your Medications? N  Managing your Finances? N  Housekeeping or managing your Housekeeping? N  Some recent data might be hidden    Patient Care Team: Pcp, No as PCP - General  Indicate any recent Medical Services you may have received from other than [facility] providers in the past year (date may be  approximate).     Assessment:   This is a routine wellness examination for Romin.  Hearing/Vision screen Hearing Screening - Comments:: Pt denies any hearing issues  Vision Screening - Comments:: Pt follows up with [facility] for annual eye exams   Dietary issues and exercise activities discussed: Current Exercise Habits: Home exercise routine, Type of exercise: treadmill, Time (Minutes): 10, Frequency (Times/Week): 4, Weekly Exercise (Minutes/Week): 40   Goals Addressed             This Visit's Progress    Patient Stated       None at this time        Depression Screen PHQ 2/9 Scores 05/25/2021 09/24/2020 08/15/2020 05/16/2020 02/14/2020  PHQ - 2 Score 0 0 0 0 0    Fall Risk Fall Risk  05/25/2021 09/24/2020 08/15/2020 05/16/2020 02/14/2020  Falls in the past year? 0 0 0 0 0  Number falls in past yr: 0 - - 0 -  Injury with Fall? 0 - - 0 -  Risk for fall due to : - No Fall Risks - - -  Follow up Falls prevention discussed - - Falls prevention discussed -    FALL RISK PREVENTION PERTAINING TO THE HOME:  Any stairs in or around the home? No  If so, are there any without handrails? No  Home free of loose throw rugs in walkways, pet beds, electrical cords, etc? Yes  Adequate lighting in your home to reduce risk of falls? Yes   ASSISTIVE DEVICES UTILIZED TO PREVENT FALLS:  Life alert? No  Use of a cane, walker or w/c? No  Grab bars in the bathroom? Yes  Shower chair or bench in shower? No  Elevated toilet seat or a handicapped toilet? No   TIMED UP AND GO:  Was the test performed? No .   Cognitive Function:     6CIT Screen 05/25/2021  What Year? 0 points  What month? 0 points  What time? 0 points  Count back from 20 0 points  Months in reverse 4 points  Repeat phrase 10 points  Total Score 14    Immunizations Immunization History  Administered Date(s) Administered   Fluad Quad(high Dose 65+) 10/03/2017, 08/04/2018, 08/15/2020   Influenza, High Dose Seasonal PF  05/22/2015   Influenza,inj,Quad PF,6+ Mos 06/17/2016   PFIZER(Purple Top)SARS-COV-2 Vaccination 10/20/2019, 11/11/2019, 01/28/2021  Pneumococcal Conjugate-13 11/25/2014   Pneumococcal Polysaccharide-23 09/13/2008, 06/17/2016   Tdap 01/21/2014   Zoster, Live 01/21/2014    TDAP status: Up to date  Flu Vaccine status: Due, Education has been provided regarding the importance of this vaccine. Advised may receive this vaccine at local pharmacy or Health Dept. Aware to provide a copy of the vaccination record if obtained from local pharmacy or Health Dept. Verbalized acceptance and understanding.  Pneumococcal vaccine status: Up to date  Covid-19 vaccine status: Completed vaccines  Qualifies for Shingles Vaccine? Yes   Zostavax completed No   Shingrix Completed?: No.    Education has been provided regarding the importance of this vaccine. Patient has been advised to call insurance company to determine out of pocket expense if they have not yet received this vaccine. Advised may also receive vaccine at local pharmacy or Health Dept. Verbalized acceptance and understanding.  Screening Tests Health Maintenance  Topic Date Due   FOOT EXAM  Never done   OPHTHALMOLOGY EXAM  Never done   Zoster Vaccines- Shingrix (1 of 2) Never done   HEMOGLOBIN A1C  02/13/2021   INFLUENZA VACCINE  04/13/2021   COVID-19 Vaccine (4 - Booster for Pfizer series) 05/31/2021   TETANUS/TDAP  01/22/2024   PNA vac Low Risk Adult  Completed   HPV VACCINES  Aged Out    Health Maintenance  Health Maintenance Due  Topic Date Due   FOOT EXAM  Never done   OPHTHALMOLOGY EXAM  Never done   Zoster Vaccines- Shingrix (1 of 2) Never done   HEMOGLOBIN A1C  02/13/2021   INFLUENZA VACCINE  04/13/2021    Colorectal cancer screening: No longer required.    Additional Screening:   Vision Screening: Recommended annual ophthalmology exams for early detection of glaucoma and other disorders of the eye. Is the patient  up to date with their annual eye exam?  No Who is the provider or what is the name of the office in which the patient attends annual eye exams? [facility] If pt is not established with a provider, would they like to be referred to a provider to establish care? No .   Dental Screening: Recommended annual dental exams for proper oral hygiene  Community Resource Referral / Chronic Care Management: CRR required this visit?  No   CCM required this visit?  No      Plan:     I have personally reviewed and noted the following in the patient's chart:   Medical and social history Use of alcohol, tobacco or illicit drugs  Current medications and supplements including opioid prescriptions. Patient is not currently taking opioid prescriptions. Functional ability and status Nutritional status Physical activity Advanced directives List of other physicians Hospitalizations, surgeries, and ER visits in previous 12 months Vitals Screenings to include cognitive, depression, and falls Referrals and appointments  In addition, I have reviewed and discussed with patient certain preventive protocols, quality metrics, and best practice recommendations. A written personalized care plan for preventive services as well as general preventive health recommendations were provided to patient.     Marzella Schlein, LPN   [id number]   Nurse Notes: None

## 2021-05-25 NOTE — Patient Instructions (Signed)
[name] , Thank you for taking time to come for your Medicare Wellness Visit. I appreciate your ongoing commitment to your health goals. Please review the following plan we discussed and let me know if I can assist you in the future.   Screening recommendations/referrals: Colonoscopy: No longer required  Recommended yearly ophthalmology/optometry visit for glaucoma screening and checkup Recommended yearly dental visit for hygiene and checkup  Vaccinations: Influenza vaccine: Due Pneumococcal vaccine: Completed Done 01/21/14 repeat every 10 years 01/22/24 Tdap vaccine: Done 01/21/14 repeat every 10 years 01/22/24 Shingles vaccine: Shingrix discussed. Please contact your pharmacy for coverage information.    Covid-19: Completed 10/20/19 & 11/11/19 & 01/28/21  Advanced directives: Advance directive discussed with you today. Even though you declined this today please call our office should you change your mind and we can give you the proper paperwork for you to fill out.  Conditions/risks identified: None at this time  Next appointment: Follow up in one year for your annual wellness visit.   Preventive Care 6 Years and Older, Male Preventive care refers to lifestyle choices and visits with your health care provider that can promote health and wellness. What does preventive care include? A yearly physical exam. This is also called an annual well check. Dental exams once or twice a year. Routine eye exams. Ask your health care provider how often you should have your eyes checked. Personal lifestyle choices, including: Daily care of your teeth and gums. Regular physical activity. Eating a healthy diet. Avoiding tobacco and drug use. Limiting alcohol use. Practicing safe sex. Taking low doses of aspirin every day. Taking vitamin and mineral supplements as recommended by your health care provider. What happens during an annual well check? The services and screenings done by your health  care provider during your annual well check will depend on your age, overall health, lifestyle risk factors, and family history of disease. Counseling  Your health care provider may ask you questions about your: Alcohol use. Tobacco use. Drug use. Emotional well-being. Home and relationship well-being. Sexual activity. Eating habits. History of falls. Memory and ability to understand (cognition). Work and work Astronomer. Screening  You may have the following tests or measurements: Height, weight, and BMI. Blood pressure. Lipid and cholesterol levels. These may be checked every 5 years, or more frequently if you are over 78 years old. Skin check. Lung cancer screening. You may have this screening every year starting at age 44 if you have a 30-pack-year history of smoking and currently smoke or have quit within the past 15 years. Fecal occult blood test (FOBT) of the stool. You may have this test every year starting at age 9. Flexible sigmoidoscopy or colonoscopy. You may have a sigmoidoscopy every 5 years or a colonoscopy every 10 years starting at age 39. Prostate cancer screening. Recommendations will vary depending on your family history and other risks. Hepatitis C blood test. Hepatitis B blood test. Sexually transmitted disease (STD) testing. Diabetes screening. This is done by checking your blood sugar (glucose) after you have not eaten for a while (fasting). You may have this done every 1-3 years. Abdominal aortic aneurysm (AAA) screening. You may need this if you are a current or former smoker. Osteoporosis. You may be screened starting at age 36 if you are at high risk. Talk with your health care provider about your test results, treatment options, and if necessary, the need for more tests. Vaccines  Your health care provider may recommend certain vaccines, such as: Influenza vaccine.  This is recommended every year. Tetanus, diphtheria, and acellular pertussis (Tdap, Td)  vaccine. You may need a Td booster every 10 years. Zoster vaccine. You may need this after age 61. Pneumococcal 13-valent conjugate (PCV13) vaccine. One dose is recommended after age 36. Pneumococcal polysaccharide (PPSV23) vaccine. One dose is recommended after age 61. Talk to your health care provider about which screenings and vaccines you need and how often you need them. This information is not intended to replace advice given to you by your health care provider. Make sure you discuss any questions you have with your health care provider. Document Released: 09/26/2015 Document Revised: 05/19/2016 Document Reviewed: 07/01/2015 Elsevier Interactive Patient Education  2017 [employer] Prevention in the Home Falls can cause injuries. They can happen to people of all ages. There are many things you can do to make your home safe and to help prevent falls. What can I do on the outside of my home? Regularly fix the edges of walkways and driveways and fix any cracks. Remove anything that might make you trip as you walk through a door, such as a raised step or threshold. Trim any bushes or trees on the path to your home. Use bright outdoor lighting. Clear any walking paths of anything that might make someone trip, such as rocks or tools. Regularly check to see if handrails are loose or broken. Make sure that both sides of any steps have handrails. Any raised decks and porches should have guardrails on the edges. Have any leaves, snow, or ice cleared regularly. Use sand or salt on walking paths during winter. Clean up any spills in your garage right away. This includes oil or grease spills. What can I do in the bathroom? Use night lights. Install grab bars by the toilet and in the tub and shower. Do not use towel bars as grab bars. Use non-skid mats or decals in the tub or shower. If you need to sit down in the shower, use a plastic, non-slip stool. Keep the floor dry. Clean up any  water that spills on the floor as soon as it happens. Remove soap buildup in the tub or shower regularly. Attach bath mats securely with double-sided non-slip rug tape. Do not have throw rugs and other things on the floor that can make you trip. What can I do in the bedroom? Use night lights. Make sure that you have a light by your bed that is easy to reach. Do not use any sheets or blankets that are too big for your bed. They should not hang down onto the floor. Have a firm chair that has side arms. You can use this for support while you get dressed. Do not have throw rugs and other things on the floor that can make you trip. What can I do in the kitchen? Clean up any spills right away. Avoid walking on wet floors. Keep items that you use a lot in easy-to-reach places. If you need to reach something above you, use a strong step stool that has a grab bar. Keep electrical cords out of the way. Do not use floor polish or wax that makes floors slippery. If you must use wax, use non-skid floor wax. Do not have throw rugs and other things on the floor that can make you trip. What can I do with my stairs? Do not leave any items on the stairs. Make sure that there are handrails on both sides of the stairs and use them. Fix handrails  that are broken or loose. Make sure that handrails are as long as the stairways. Check any carpeting to make sure that it is firmly attached to the stairs. Fix any carpet that is loose or worn. Avoid having throw rugs at the top or bottom of the stairs. If you do have throw rugs, attach them to the floor with carpet tape. Make sure that you have a light switch at the top of the stairs and the bottom of the stairs. If you do not have them, ask someone to add them for you. What else can I do to help prevent falls? Wear shoes that: Do not have high heels. Have rubber bottoms. Are comfortable and fit you well. Are closed at the toe. Do not wear sandals. If you use a  stepladder: Make sure that it is fully opened. Do not climb a closed stepladder. Make sure that both sides of the stepladder are locked into place. Ask someone to hold it for you, if possible. Clearly mark and make sure that you can see: Any grab bars or handrails. First and last steps. Where the edge of each step is. Use tools that help you move around (mobility aids) if they are needed. These include: Canes. Walkers. Scooters. Crutches. Turn on the lights when you go into a dark area. Replace any light bulbs as soon as they burn out. Set up your furniture so you have a clear path. Avoid moving your furniture around. If any of your floors are uneven, fix them. If there are any pets around you, be aware of where they are. Review your medicines with your doctor. Some medicines can make you feel dizzy. This can increase your chance of falling. Ask your doctor what other things that you can do to help prevent falls. This information is not intended to replace advice given to you by your health care provider. Make sure you discuss any questions you have with your health care provider. Document Released: 06/26/2009 Document Revised: 02/05/2016 Document Reviewed: 10/04/2014 Elsevier Interactive Patient Education  2017 [employer].

## 2021-06-02 ENCOUNTER — Other Ambulatory Visit: Payer: Self-pay

## 2021-06-02 ENCOUNTER — Encounter: Payer: Self-pay | Admitting: Family Medicine

## 2021-06-02 ENCOUNTER — Ambulatory Visit (INDEPENDENT_AMBULATORY_CARE_PROVIDER_SITE_OTHER): Payer: Medicare HMO | Admitting: Family Medicine

## 2021-06-02 VITALS — BP 135/82 | HR 76 | Temp 98.0°F | Ht 65.0 in | Wt 160.2 lb

## 2021-06-02 DIAGNOSIS — J449 Chronic obstructive pulmonary disease, unspecified: Secondary | ICD-10-CM

## 2021-06-02 DIAGNOSIS — D696 Thrombocytopenia, unspecified: Secondary | ICD-10-CM | POA: Diagnosis not present

## 2021-06-02 DIAGNOSIS — E782 Mixed hyperlipidemia: Secondary | ICD-10-CM

## 2021-06-02 DIAGNOSIS — Z23 Encounter for immunization: Secondary | ICD-10-CM

## 2021-06-02 DIAGNOSIS — R351 Nocturia: Secondary | ICD-10-CM

## 2021-06-02 DIAGNOSIS — N401 Enlarged prostate with lower urinary tract symptoms: Secondary | ICD-10-CM

## 2021-06-02 DIAGNOSIS — I10 Essential (primary) hypertension: Secondary | ICD-10-CM

## 2021-06-02 DIAGNOSIS — R6889 Other general symptoms and signs: Secondary | ICD-10-CM | POA: Diagnosis not present

## 2021-06-02 DIAGNOSIS — R7303 Prediabetes: Secondary | ICD-10-CM

## 2021-06-02 LAB — COMPREHENSIVE METABOLIC PANEL
ALT: 14 U/L (ref 0–53)
AST: 20 U/L (ref 0–37)
Albumin: 4.4 g/dL (ref 3.5–5.2)
Alkaline Phosphatase: 65 U/L (ref 39–117)
BUN: 27 mg/dL — ABNORMAL HIGH (ref 6–23)
CO2: 25 mEq/L (ref 19–32)
Calcium: 9.7 mg/dL (ref 8.4–10.5)
Chloride: 104 mEq/L (ref 96–112)
Creatinine, Ser: 1.43 mg/dL (ref 0.40–1.50)
GFR: 45.49 mL/min — ABNORMAL LOW (ref >60.00)
Glucose, Bld: 116 mg/dL — ABNORMAL HIGH (ref 70–99)
Potassium: 4.3 mEq/L (ref 3.5–5.1)
Sodium: 139 mEq/L (ref 135–145)
Total Bilirubin: 0.6 mg/dL (ref 0.2–1.2)
Total Protein: 7.9 g/dL (ref 6.0–8.3)

## 2021-06-02 LAB — LIPID PANEL
Cholesterol: 135 mg/dL (ref 0–200)
HDL: 36.1 mg/dL — ABNORMAL LOW (ref >39.00)
NonHDL: 99.01
Total CHOL/HDL Ratio: 4
Triglycerides: 234 mg/dL — ABNORMAL HIGH (ref 0.0–149.0)
VLDL: 46.8 mg/dL — ABNORMAL HIGH (ref 0.0–40.0)

## 2021-06-02 LAB — CBC
HCT: 37.9 % — ABNORMAL LOW (ref 39.0–52.0)
Hemoglobin: 12.7 g/dL — ABNORMAL LOW (ref 13.0–17.0)
MCHC: 33.6 g/dL (ref 30.0–36.0)
MCV: 90.4 fl (ref 78.0–100.0)
Platelets: 102 10*3/uL — ABNORMAL LOW (ref 150.0–400.0)
RBC: 4.19 Mil/uL — ABNORMAL LOW (ref 4.22–5.81)
RDW: 14.7 % (ref 11.5–15.5)
WBC: 5.4 10*3/uL (ref 4.0–10.5)

## 2021-06-02 LAB — LDL CHOLESTEROL, DIRECT: Direct LDL: 55 mg/dL

## 2021-06-02 LAB — HEMOGLOBIN A1C: Hgb A1c MFr Bld: 6.6 % — ABNORMAL HIGH (ref 4.6–6.5)

## 2021-06-02 LAB — TSH: TSH: 2.23 u[IU]/mL (ref 0.35–5.50)

## 2021-06-02 MED ORDER — ROSUVASTATIN CALCIUM 40 MG PO TABS: 40.0000 mg | ORAL_TABLET | Freq: Every day | ORAL | 3 refills | Status: DC

## 2021-06-02 MED ORDER — AMLODIPINE BESYLATE 5 MG PO TABS: 5.0000 mg | ORAL_TABLET | Freq: Every day | ORAL | 3 refills | Status: DC

## 2021-06-02 MED ORDER — MONTELUKAST SODIUM 10 MG PO TABS: 10.0000 mg | ORAL_TABLET | Freq: Every day | ORAL | 3 refills | Status: DC

## 2021-06-02 MED ORDER — OLMESARTAN MEDOXOMIL 40 MG PO TABS: 40.0000 mg | ORAL_TABLET | Freq: Every day | ORAL | 0 refills | Status: DC

## 2021-06-02 MED ORDER — FUROSEMIDE 20 MG PO TABS: 20.0000 mg | ORAL_TABLET | Freq: Every day | ORAL | 5 refills | Status: DC

## 2021-06-02 NOTE — Patient Instructions (Signed)
It was very nice to see you today!  We will give your flu shot today.  We will check blood work today.  Please start the Singulair.  I think this will help some with your wheezing.  I will see you back in year for your annual checkup with labs.  Please come back to see me sooner if needed.  Take care, Dr Jimmey Ralph  PLEASE NOTE:  If you had any lab tests please let us know if you have not heard back within a few days. You may see your results on mychart before we have a chance to review them but we will give you a call once they are reviewed by [place]. If we ordered any referrals today, please let us know if you have not heard from their office within the next week.   Please try these tips to maintain a healthy lifestyle:  Eat at least 3 REAL meals and 1-2 snacks per day.  Aim for no more than 5 hours between eating.  If you eat breakfast, please do so within one hour of getting up.   Each meal should contain half fruits/vegetables, one quarter protein, and one quarter carbs (no bigger than a computer mouse)  Cut down on sweet beverages. This includes juice, soda, and sweet tea.   Drink at least 1 glass of water with each meal and aim for at least 8 glasses per day  Exercise at least 150 minutes every week.

## 2021-06-02 NOTE — Assessment & Plan Note (Signed)
Check A1c. 

## 2021-06-02 NOTE — Assessment & Plan Note (Signed)
He has noticed more nocturnal wheezing. Normal lung exam today.  He has been on inhalers in the past but did not remember the name.  We will start Singulair.

## 2021-06-02 NOTE — Assessment & Plan Note (Signed)
At goal today.  Continue amlodipine 5 mg daily and olmesartan 40 mg daily.

## 2021-06-02 NOTE — Assessment & Plan Note (Signed)
Stable without meds. 

## 2021-06-02 NOTE — Progress Notes (Signed)
[name] is a 85 y.o. male who presents today for an office visit.  Assessment/Plan:  Chronic Problems Addressed Today: Hyperlipidemia On Crestor 40 mg daily.  Check lipids.  HTN (hypertension) At goal today.  Continue amlodipine 5 mg daily and olmesartan 40 mg daily.  Benign prostatic hyperplasia with nocturia Stable without meds.   Chronic obstructive pulmonary disease (HCC) He has noticed more nocturnal wheezing. Normal lung exam today.  He has been on inhalers in the past but did not remember the name.  We will start Singulair.  Pre-diabetes Check A1c.  Thrombocytopenia (HCC) Check CBC.  Flu vaccine given today.     Subjective:  HPI:  Patient here to establish care.  Previous PCP no longer works at this office.  See A/P for status of chronic conditions.  PMH:  The following were reviewed and entered/updated in epic: Past Medical History:  Diagnosis Date   BPH (benign prostatic hyperplasia)    COPD (chronic obstructive pulmonary disease) (HCC)    CVA (cerebral vascular accident) (HCC)    Diabetes mellitus without complication (HCC)    Dizziness and giddiness 07/09/2015   Hyperlipidemia    Hypertension    Renal disorder    Patient Active Problem List   Diagnosis Date Noted   Pre-diabetes 02/14/2020   Thrombocytopenia (HCC) 12/12/2018   Chronic obstructive pulmonary disease (HCC) 11/14/2014   Benign prostatic hyperplasia with nocturia 11/14/2014   Primary osteoarthritis involving multiple joints 11/14/2014   Former heavy tobacco smoker 12/04/2013   Normocytic anemia 03/02/2012   CVA (cerebral vascular accident) (HCC) 03/01/2012   HTN (hypertension) 03/01/2012   Hyperlipidemia 03/01/2012   Stage 3a chronic kidney disease (HCC) 03/01/2012   Past Surgical History:  Procedure Laterality Date   APPENDECTOMY     TEE WITHOUT CARDIOVERSION  03/10/2012   Procedure: TRANSESOPHAGEAL ECHOCARDIOGRAM (TEE);  Surgeon: Ricki Rodriguez, MD;  Location: [facility]  ENDOSCOPY;  Service: Cardiovascular;  Laterality: N/A;    Family History  Problem Relation Age of Onset   Heart attack Father    Cancer Mother    Cancer Sister        brain tumor   Heart attack Brother    Heart attack Brother    Heart disease Brother     Medications- reviewed and updated Current Outpatient Medications  Medication Sig Dispense Refill   aspirin EC 81 MG tablet Take 1 tablet (81 mg total) by mouth daily. 90 tablet 1   Cholecalciferol 50 MCG (2000 UT) CAPS Take by mouth.     montelukast (SINGULAIR) 10 MG tablet Take 1 tablet (10 mg total) by mouth at bedtime. 30 tablet 3   olopatadine (PATANOL) 0.1 % ophthalmic solution Place 1 drop into both eyes 2 (two) times daily.     Omega-3 Fatty Acids (FISH OIL) 1000 MG CAPS Take 1 capsule by mouth daily.     amLODipine (NORVASC) 5 MG tablet Take 1 tablet (5 mg total) by mouth daily. 90 tablet 3   furosemide (LASIX) 20 MG tablet Take 1 tablet (20 mg total) by mouth daily. 30 tablet 5   loratadine (CLARITIN) 10 MG tablet Take 10 mg by mouth daily. (Patient not taking: Reported on 06/02/2021)     olmesartan (BENICAR) 40 MG tablet Take 1 tablet (40 mg total) by mouth daily. 90 tablet 0   rosuvastatin (CRESTOR) 40 MG tablet Take 1 tablet (40 mg total) by mouth daily. 90 tablet 3   No current facility-administered medications for this visit.    Allergies-reviewed  and updated Allergies  Allergen Reactions   Ace Inhibitors     cough    Social History   Socioeconomic History   Marital status: Married    Spouse name: Not on file   Number of children: 6   Years of education: HS   Highest education level: Not on file  Occupational History   Occupation: retired  Tobacco Use   Smoking status: Former   Smokeless tobacco: Never  Substance and Sexual Activity   Alcohol use: Yes    Alcohol/week: 0.0 standard drinks    Comment: occasionally   Drug use: No   Sexual activity: Not on file  Other Topics Concern   Not on file   Social History Narrative   Patient drinks caffeine occasionally.   Patient is right handed.    Social Determinants of Health   Financial Resource Strain: Low Risk    Difficulty of Paying Living Expenses: Not hard at all  Food Insecurity: No Food Insecurity   Worried About Programme researcher, broadcasting/film/video in the Last Year: Never true   Ran Out of Food in the Last Year: Never true  Transportation Needs: No Transportation Needs   Lack of Transportation (Medical): No   Lack of Transportation (Non-Medical): No  Physical Activity: Insufficiently Active   Days of Exercise per Week: 4 days   Minutes of Exercise per Session: 10 min  Stress: No Stress Concern Present   Feeling of Stress : Not at all  Social Connections: Moderately Isolated   Frequency of Communication with Friends and Family: More than three times a week   Frequency of Social Gatherings with Friends and Family: Three times a week   Attends Religious Services: Never   Active Member of Clubs or Organizations: No   Attends Engineer, structural: Never   Marital Status: Married          Objective:  Physical Exam: BP 135/82   Pulse 76   Temp 98 F (36.7 C) (Temporal)   Ht 5\' 5"  (1.651 m)   Wt 160 lb 3.2 oz (72.7 kg)   BMI 26.66 kg/m   Gen: No acute distress, resting comfortably CV: Regular rate and rhythm with no murmurs appreciated Pulm: Normal work of breathing, clear to auscultation bilaterally with no crackles, wheezes, or rhonchi Neuro: Grossly normal, moves all extremities Psych: Normal affect and thought content      Time Spent: 45 minutes of total time was spent on the date of the encounter performing the following actions: chart review prior to seeing the patient including most recent visits with previous PCP, obtaining history, performing a medically necessary exam, counseling on the treatment plan, placing orders, and documenting in our EHR.    . Katina Degree, MD 06/02/2021 12:10 PM

## 2021-06-02 NOTE — Assessment & Plan Note (Signed)
On Crestor 40 mg daily.  Check lipids. 

## 2021-06-02 NOTE — Assessment & Plan Note (Signed)
Check CBC 

## 2021-06-03 NOTE — Progress Notes (Signed)
Please inform patient of the following:  Labs are all stable. Do not need to make any changes to his treatment plan at this time. I would like to see him back in 6 months to recheck his A1c.  Katina Degree. Jimmey Ralph, MD 06/03/2021 10:17 AM

## 2021-06-11 ENCOUNTER — Encounter: Payer: Medicare HMO | Admitting: Family Medicine

## 2021-10-21 ENCOUNTER — Encounter: Payer: Medicare HMO | Admitting: Family Medicine

## 2021-11-11 ENCOUNTER — Other Ambulatory Visit: Payer: Self-pay | Admitting: Family Medicine

## 2021-11-25 ENCOUNTER — Encounter: Payer: Self-pay | Admitting: Family Medicine

## 2021-11-25 ENCOUNTER — Ambulatory Visit (INDEPENDENT_AMBULATORY_CARE_PROVIDER_SITE_OTHER): Payer: Medicare HMO | Admitting: Family Medicine

## 2021-11-25 VITALS — BP 141/85 | HR 67 | Temp 97.0°F | Ht 65.0 in | Wt 160.8 lb

## 2021-11-25 DIAGNOSIS — D696 Thrombocytopenia, unspecified: Secondary | ICD-10-CM | POA: Diagnosis not present

## 2021-11-25 DIAGNOSIS — Z79899 Other long term (current) drug therapy: Secondary | ICD-10-CM | POA: Diagnosis not present

## 2021-11-25 DIAGNOSIS — R7303 Prediabetes: Secondary | ICD-10-CM | POA: Diagnosis not present

## 2021-11-25 DIAGNOSIS — N1831 Chronic kidney disease, stage 3a: Secondary | ICD-10-CM

## 2021-11-25 DIAGNOSIS — J449 Chronic obstructive pulmonary disease, unspecified: Secondary | ICD-10-CM

## 2021-11-25 DIAGNOSIS — R6889 Other general symptoms and signs: Secondary | ICD-10-CM | POA: Diagnosis not present

## 2021-11-25 DIAGNOSIS — I10 Essential (primary) hypertension: Secondary | ICD-10-CM | POA: Diagnosis not present

## 2021-11-25 LAB — COMPREHENSIVE METABOLIC PANEL
ALT: 9 U/L (ref 0–53)
AST: 15 U/L (ref 0–37)
Albumin: 4.3 g/dL (ref 3.5–5.2)
Alkaline Phosphatase: 74 U/L (ref 39–117)
BUN: 19 mg/dL (ref 6–23)
CO2: 27 mEq/L (ref 19–32)
Calcium: 9.4 mg/dL (ref 8.4–10.5)
Chloride: 103 mEq/L (ref 96–112)
Creatinine, Ser: 1.23 mg/dL (ref 0.40–1.50)
GFR: 54.32 mL/min — ABNORMAL LOW (ref >60.00)
Glucose, Bld: 116 mg/dL — ABNORMAL HIGH (ref 70–99)
Potassium: 3.8 mEq/L (ref 3.5–5.1)
Sodium: 138 mEq/L (ref 135–145)
Total Bilirubin: 0.7 mg/dL (ref 0.2–1.2)
Total Protein: 7 g/dL (ref 6.0–8.3)

## 2021-11-25 LAB — IBC + FERRITIN
Ferritin: 49.4 ng/mL (ref 22.0–322.0)
Iron: 76 ug/dL (ref 42–165)
Saturation Ratios: 19.4 % — ABNORMAL LOW (ref 20.0–50.0)
TIBC: 392 ug/dL (ref 250.0–450.0)
Transferrin: 280 mg/dL (ref 212.0–360.0)

## 2021-11-25 LAB — VITAMIN D 25 HYDROXY (VIT D DEFICIENCY, FRACTURES): VITD: 23.55 ng/mL — ABNORMAL LOW (ref 30.00–100.00)

## 2021-11-25 LAB — CBC
HCT: 36.8 % — ABNORMAL LOW (ref 39.0–52.0)
Hemoglobin: 12.4 g/dL — ABNORMAL LOW (ref 13.0–17.0)
MCHC: 33.8 g/dL (ref 30.0–36.0)
MCV: 91 fl (ref 78.0–100.0)
Platelets: 91 10*3/uL — ABNORMAL LOW (ref 150.0–400.0)
RBC: 4.04 Mil/uL — ABNORMAL LOW (ref 4.22–5.81)
RDW: 14.9 % (ref 11.5–15.5)
WBC: 5.8 10*3/uL (ref 4.0–10.5)

## 2021-11-25 LAB — POCT GLYCOSYLATED HEMOGLOBIN (HGB A1C): Hemoglobin A1C: 5.8 % — AB (ref 4.0–5.6)

## 2021-11-25 LAB — VITAMIN B12: Vitamin B-12: 80 pg/mL — ABNORMAL LOW (ref 211–911)

## 2021-11-25 LAB — TSH: TSH: 2.29 u[IU]/mL (ref 0.35–5.50)

## 2021-11-25 NOTE — Patient Instructions (Signed)
It was very nice to see you today! ? ?We will check blood work today. ? ?Please come back in 6 months.  Come back sooner if needed. ? ?Take care, ?Dr Jerline Pain ? ?PLEASE NOTE: ? ?If you had any lab tests please let us know if you have not heard back within a few days. You may see your results on mychart before we have a chance to review them but we will give you a call once they are reviewed by [place]. If we ordered any referrals today, please let us know if you have not heard from their office within the next week.  ? ?Please try these tips to maintain a healthy lifestyle: ? ?Eat at least 3 REAL meals and 1-2 snacks per day.  Aim for no more than 5 hours between eating.  If you eat breakfast, please do so within one hour of getting up.  ? ?Each meal should contain half fruits/vegetables, one quarter protein, and one quarter carbs (no bigger than a computer mouse) ? ?Cut down on sweet beverages. This includes juice, soda, and sweet tea.  ? ?Drink at least 1 glass of water with each meal and aim for at least 8 glasses per day ? ?Exercise at least 150 minutes every week.   ?

## 2021-11-25 NOTE — Assessment & Plan Note (Signed)
Doing well off meds.  No recent flares. ?

## 2021-11-25 NOTE — Assessment & Plan Note (Signed)
At goal on amlodipine 5 mg daily and olmesartan 40 mg daily.  Check labs. ?

## 2021-11-25 NOTE — Assessment & Plan Note (Signed)
Check CBC 

## 2021-11-25 NOTE — Progress Notes (Signed)
? ?  [name] is a 86 y.o. male who presents today for an office visit. ? ?Assessment/Plan:  ?New/Acute Problems: ?Other Fatigue ?Likely multifactorial.  Does have a history of normocytic anemia which could be contributing.  We will check labs today including CBC, c-Met, TSH, B12, and vitamin D. ? ?Chronic Problems Addressed Today: ?Pre-diabetes ?A1c well controlled at 5.8. Continue lifestyle modifications.  ? ?HTN (hypertension) ?At goal on amlodipine 5 mg daily and olmesartan 40 mg daily.  Check labs. ? ?Thrombocytopenia ([name]) ?Check CBC. ? ?Stage 3a chronic kidney disease ?Check c-Met. ? ?Chronic obstructive pulmonary disease ([name]) ?Doing well off meds.  No recent flares. ? ? ?  ?Subjective:  ?HPI: ? ?Please see A/P for status of chronic conditions.  He has had ongoing issues with fatigue for the last several weeks.  No fevers or chills.  No restenosis.  Feels like he is sleeping normally.  No medication changes.  No nausea or vomiting. ? ?   ?  ?Objective:  ?Physical Exam: ?BP (!) 141/85 (BP Location: Right Arm)   Pulse 67   Temp (!) 97 ?F (36.1 ?C) (Temporal)   Ht $R'5\' 5"'sB$  (1.651 m)   Wt 160 lb 12.8 oz (72.9 kg)   SpO2 100%   BMI 26.76 kg/m?   ?Wt Readings from Last 3 Encounters:  ?11/25/21 160 lb 12.8 oz (72.9 kg)  ?06/02/21 160 lb 3.2 oz (72.7 kg)  ?09/24/20 159 lb 3.2 oz (72.2 kg)  ? ? ?Gen: No acute distress, resting comfortably ?CV: Regular rate and rhythm with no murmurs appreciated ?Pulm: Normal work of breathing, clear to auscultation bilaterally with no crackles, wheezes, or rhonchi ?Neuro: Grossly normal, moves all extremities ?Psych: Normal affect and thought content ? ?   ? ?[name]. Ryan Pain, MD ?11/25/2021 10:57 AM  ?

## 2021-11-25 NOTE — Assessment & Plan Note (Signed)
Check c-Met. 

## 2021-11-25 NOTE — Assessment & Plan Note (Signed)
A1c well controlled at 5.8. Continue lifestyle modifications.  ?

## 2021-11-30 ENCOUNTER — Encounter: Payer: Self-pay | Admitting: Family Medicine

## 2021-11-30 DIAGNOSIS — E538 Deficiency of other specified B group vitamins: Secondary | ICD-10-CM | POA: Insufficient documentation

## 2021-11-30 DIAGNOSIS — E559 Vitamin D deficiency, unspecified: Secondary | ICD-10-CM | POA: Insufficient documentation

## 2021-11-30 NOTE — Progress Notes (Signed)
Please inform patient of the following: ? ?His B12 is very low. I think this could explain some of his symptoms. Recommend starting B12 replacement injections here. ? ?His vitamin D is also low. Recommend starting 1000-2000 IU vitamin D daily. We can recheck in 3-6 months.  ? ?The rest of his labs are all stable and we can recheck in a year. ? ?[name]. Jimmey Ralph, MD ?11/30/2021 9:52 AM  ?

## 2021-12-29 ENCOUNTER — Emergency Department (HOSPITAL_COMMUNITY): Payer: Medicare HMO

## 2021-12-29 ENCOUNTER — Other Ambulatory Visit: Payer: Self-pay

## 2021-12-29 ENCOUNTER — Encounter (HOSPITAL_COMMUNITY): Payer: Self-pay | Admitting: Emergency Medicine

## 2021-12-29 ENCOUNTER — Emergency Department (HOSPITAL_COMMUNITY)
Admission: EM | Admit: 2021-12-29 | Discharge: 2021-12-29 | Disposition: A | Discharge status: Home / Self Care | Payer: Medicare HMO | Attending: Emergency Medicine | Admitting: Emergency Medicine

## 2021-12-29 DIAGNOSIS — I712 Thoracic aortic aneurysm, without rupture, unspecified: Secondary | ICD-10-CM | POA: Diagnosis not present

## 2021-12-29 DIAGNOSIS — I7123 Aneurysm of the descending thoracic aorta, without rupture: Secondary | ICD-10-CM | POA: Insufficient documentation

## 2021-12-29 DIAGNOSIS — R059 Cough, unspecified: Secondary | ICD-10-CM | POA: Diagnosis not present

## 2021-12-29 DIAGNOSIS — M546 Pain in thoracic spine: Secondary | ICD-10-CM | POA: Diagnosis present

## 2021-12-29 DIAGNOSIS — R0781 Pleurodynia: Secondary | ICD-10-CM | POA: Insufficient documentation

## 2021-12-29 DIAGNOSIS — I7143 Infrarenal abdominal aortic aneurysm, without rupture: Secondary | ICD-10-CM

## 2021-12-29 DIAGNOSIS — W010XXA Fall on same level from slipping, tripping and stumbling without subsequent striking against object, initial encounter: Secondary | ICD-10-CM | POA: Insufficient documentation

## 2021-12-29 DIAGNOSIS — J439 Emphysema, unspecified: Secondary | ICD-10-CM | POA: Diagnosis not present

## 2021-12-29 DIAGNOSIS — I700 Atherosclerosis of aorta: Secondary | ICD-10-CM | POA: Diagnosis not present

## 2021-12-29 DIAGNOSIS — M40209 Unspecified kyphosis, site unspecified: Secondary | ICD-10-CM | POA: Diagnosis not present

## 2021-12-29 DIAGNOSIS — Z7982 Long term (current) use of aspirin: Secondary | ICD-10-CM | POA: Diagnosis not present

## 2021-12-29 DIAGNOSIS — Z043 Encounter for examination and observation following other accident: Secondary | ICD-10-CM | POA: Diagnosis not present

## 2021-12-29 LAB — CBC
HCT: 38.1 % — ABNORMAL LOW (ref 39.0–52.0)
Hemoglobin: 12.5 g/dL — ABNORMAL LOW (ref 13.0–17.0)
MCH: 30.9 pg (ref 26.0–34.0)
MCHC: 32.8 g/dL (ref 30.0–36.0)
MCV: 94.1 fL (ref 80.0–100.0)
Platelets: 100 K/uL — ABNORMAL LOW (ref 150–400)
RBC: 4.05 MIL/uL — ABNORMAL LOW (ref 4.22–5.81)
RDW: 14.3 % (ref 11.5–15.5)
WBC: 4.8 K/uL (ref 4.0–10.5)
nRBC: 0 % (ref 0.0–0.2)

## 2021-12-29 LAB — I-STAT CHEM 8, ED
BUN: 21 mg/dL (ref 8–23)
Calcium, Ion: 1.21 mmol/L (ref 1.15–1.40)
Chloride: 106 mmol/L (ref 98–111)
Creatinine, Ser: 1.4 mg/dL — ABNORMAL HIGH (ref 0.61–1.24)
Glucose, Bld: 114 mg/dL — ABNORMAL HIGH (ref 70–99)
HCT: 37 % — ABNORMAL LOW (ref 39.0–52.0)
Hemoglobin: 12.6 g/dL — ABNORMAL LOW (ref 13.0–17.0)
Potassium: 3.9 mmol/L (ref 3.5–5.1)
Sodium: 142 mmol/L (ref 135–145)
TCO2: 24 mmol/L (ref 22–32)

## 2021-12-29 MED ORDER — SODIUM CHLORIDE 0.9 % IV BOLUS
500.0000 mL | Freq: Once | INTRAVENOUS | Status: AC
  Administered 2021-12-29: 500 mL via INTRAVENOUS

## 2021-12-29 MED ORDER — ACETAMINOPHEN 325 MG PO TABS
650.0000 mg | ORAL_TABLET | Freq: Once | ORAL | Status: AC
  Administered 2021-12-29: 650 mg via ORAL
  Filled 2021-12-29: qty 2

## 2021-12-29 MED ORDER — IOHEXOL 300 MG/ML  SOLN
75.0000 mL | Freq: Once | INTRAMUSCULAR | Status: AC | PRN
  Administered 2021-12-29: 75 mL via INTRAVENOUS

## 2021-12-29 NOTE — ED Provider Notes (Signed)
?[facility]-EMERGENCY DEPT ?Provider Note ? ? ?CSN: [id number] ?Arrival date & time: 12/29/21  [id number] ? ?  ? ?History ? ?Chief Complaint  ?Patient presents with  ? Back Pain  ? ? ?[name] is a 86 y.o. male. ? ? ?Back Pain ?Associated symptoms: no fever   ? ?  ?Patient presents to the ED for evaluation of back pain.  Patient states about a week ago he slipped on a wet floor and slid down onto his buttocks.  Patient was able to get up and walk.  However since that time he has had persistent pain in his ribs mostly on the right side but it does seem to move towards the front of his chest as well as his abdomen.  Patient states it hurts when he takes a deep breath.  It also hurts when he coughs.  He is not having any pain in his lower back.  No pain in his hip or pelvis.  He denies any fevers or chills.  He has not been feeling short of breath.  He denies any abdominal pain but has had 1 episode of loose stools.  Patient has not seen anyone for this since his fall ?Home Medications ?Prior to Admission medications   ?Medication Sig Start Date End Date Taking? Authorizing Provider  ?amLODipine (NORVASC) 5 MG tablet Take 1 tablet (5 mg total) by mouth daily. 06/02/21   Ardith DarkParker, Caleb M, MD  ?aspirin EC 81 MG tablet Take 1 tablet (81 mg total) by mouth daily. 02/14/20   Orland MustardWolfe, Allison, MD  ?Cholecalciferol 50 MCG (2000 UT) CAPS Take by mouth.    [provider]  ?furosemide (LASIX) 20 MG tablet TAKE 1 TABLET EVERY DAY 11/11/21   Ardith DarkParker, Caleb M, MD  ?loratadine (CLARITIN) 10 MG tablet Take 10 mg by mouth daily. ?Patient not taking: Reported on 11/25/2021    [provider]  ?montelukast (SINGULAIR) 10 MG tablet TAKE 1 TABLET AT BEDTIME ?Patient not taking: Reported on 11/25/2021 11/11/21   Ardith DarkParker, Caleb M, MD  ?olmesartan (BENICAR) 40 MG tablet TAKE 1 TABLET EVERY DAY 11/11/21   Ardith DarkParker, Caleb M, MD  ?olopatadine (PATANOL) 0.1 % ophthalmic solution Place 1 drop into both eyes 2 (two) times  daily. ?Patient not taking: Reported on 11/25/2021    [provider]  ?Omega-3 Fatty Acids (FISH OIL) 1000 MG CAPS Take 1 capsule by mouth daily. ?Patient not taking: Reported on 11/25/2021    [provider]  ?rosuvastatin (CRESTOR) 40 MG tablet Take 1 tablet (40 mg total) by mouth daily. 06/02/21   Ardith DarkParker, Caleb M, MD  ?   ? ?Allergies    ?Ace inhibitors   ? ?Review of Systems   ?Review of Systems  ?Constitutional:  Negative for fever.  ?Musculoskeletal:  Positive for back pain.  ? ?Physical Exam ?Updated Vital Signs ?BP (!) 131/91   Pulse 69   Temp 97.6 ?F (36.4 ?C) (Oral)   Resp 16   Ht 1.651 m (5\' 5" )   Wt 68.9 kg   SpO2 98%   BMI 25.29 kg/m?  ?Physical Exam ?Vitals and nursing note reviewed.  ?Constitutional:   ?   General: He is not in acute distress. ?   Appearance: He is well-developed.  ?HENT:  ?   Head: Normocephalic and atraumatic.  ?   Right Ear: External ear normal.  ?   Left Ear: External ear normal.  ?Eyes:  ?   General: No scleral icterus.    ?  Right eye: No discharge.     ?   Left eye: No discharge.  ?   Conjunctiva/sclera: Conjunctivae normal.  ?Neck:  ?   Trachea: No tracheal deviation.  ?Cardiovascular:  ?   Rate and Rhythm: Normal rate and regular rhythm.  ?Pulmonary:  ?   Effort: Pulmonary effort is normal. No respiratory distress.  ?   Breath sounds: Normal breath sounds. No stridor. No wheezing or rales.  ?Chest:  ?   Comments: Mild tenderness bilateral rib region posteriorly, no crepitus ?Abdominal:  ?   General: Bowel sounds are normal. There is no distension.  ?   Palpations: Abdomen is soft.  ?   Tenderness: There is no abdominal tenderness. There is no guarding or rebound.  ?Musculoskeletal:     ?   General: No tenderness or deformity.  ?   Cervical back: Neck supple. No tenderness.  ?   Thoracic back: No tenderness.  ?   Lumbar back: No tenderness.  ?Skin: ?   General: Skin is warm and dry.  ?   Findings: No rash.  ?Neurological:  ?   General: No focal deficit  present.  ?   Mental Status: He is alert.  ?   Cranial Nerves: No cranial nerve deficit (no facial droop, extraocular movements intact, no slurred speech).  ?   Sensory: No sensory deficit.  ?   Motor: No abnormal muscle tone or seizure activity.  ?   Coordination: Coordination normal.  ?Psychiatric:     ?   Mood and Affect: Mood normal.  ? ? ?ED Results / Procedures / Treatments   ?Labs ?(all labs ordered are listed, but only abnormal results are displayed) ?Labs Reviewed  ?CBC - Abnormal; Notable for the following components:  ?    Result Value  ? RBC 4.05 (*)   ? Hemoglobin 12.5 (*)   ? HCT 38.1 (*)   ? Platelets 100 (*)   ? All other components within normal limits  ?I-STAT CHEM 8, ED - Abnormal; Notable for the following components:  ? Creatinine, Ser 1.40 (*)   ? Glucose, Bld 114 (*)   ? Hemoglobin 12.6 (*)   ? HCT 37.0 (*)   ? All other components within normal limits  ? ? ?EKG ?None ? ?Radiology ?DG Chest 2 View ? ?Result Date: 12/29/2021 ?CLINICAL DATA:  cough, fall, rib pain EXAM: CHEST - 2 VIEW COMPARISON:  Chest radiograph 03/02/2012, CT 03/07/2012 FINDINGS: The cardiomediastinal silhouette is unremarkable. There is no definite airspace consolidation. There is no pleural effusion. No pneumothorax. Bilateral shoulder osteoarthritis. There is a midthoracic compression deformity with approximately 25% height loss anteriorly, new since 2013. There is focal kyphosis at the thoracolumbar junction with anterior wedging at T12. There is a large rounded opacity overlying the posterior left chest. IMPRESSION: Large rounded opacity overlying the posterior left chest. Recommend chest CT with contrast. Age-indeterminate midthoracic compression deformity with approximately 25% height loss anteriorly. This is new since 2013. Focal kyphosis at the thoracolumbar junction with anterior wedging at T12. Electronically Signed   By: Caprice Renshaw M.D.   On: 12/29/2021 17:37  ? ?DG Thoracic Spine 2 View ? ?Result Date:  12/29/2021 ?CLINICAL DATA:  cough, fall, rib pain EXAM: THORACIC SPINE 2 VIEWS COMPARISON:  Two-view chest radiograph 03/02/2012 FINDINGS: There is a midthoracic compression deformity of the approximately 25% height loss, new since 2013. Focal kyphosis at the thoracolumbar junction with anterior wedging at T12. There is a large rounded opacity overlying the  left posterior chest. IMPRESSION: Age-indeterminate midthoracic compression deformity with approximately 25% height loss, new since 2013. Large rounded opacity overlying the left posterior chest, recommend chest CT with contrast. Focal kyphosis at the thoracolumbar junction with anterior wedging at T12. Electronically Signed   By: Caprice Renshaw M.D.   On: 12/29/2021 17:39  ? ?CT Chest W Contrast ? ?Result Date: 12/29/2021 ?CLINICAL DATA:  Fall. EXAM: CT CHEST WITH CONTRAST TECHNIQUE: Multidetector CT imaging of the chest was performed during intravenous contrast administration. RADIATION DOSE REDUCTION: This exam was performed according to the departmental dose-optimization program which includes automated exposure control, adjustment of the mA and/or kV according to patient size and/or use of iterative reconstruction technique. CONTRAST:  34mL OMNIPAQUE IOHEXOL 300 MG/ML  SOLN COMPARISON:  Chest CT 03/07/2012. CT abdomen and pelvis 06/06/2007 FINDINGS: Cardiovascular: There is new aneurysmal dilatation of the mid descending thoracic aorta extending to the level of the diaphragm. This measures 6.2 by 5.6 cm at the level of the midthoracic aorta and 5.4 by 5.3 cm at the level of the diaphragm. There is calcified and noncalcified plaque present. Aortic arch and descending aorta are normal in size. There are atherosclerotic calcifications of the aorta. The heart is borderline enlarged.  There is no pericardial effusion. There is adequate opacification of the pulmonary arteries. No pulmonary embolism identified to the segmental level. Mediastinum/Nodes: No enlarged  mediastinal, hilar, or axillary lymph nodes. Thyroid gland, trachea, and esophagus demonstrate no significant findings. Lungs/Pleura: Mild emphysematous changes are present. There is no focal lung infiltrate. There are mini

## 2021-12-29 NOTE — Discharge Instructions (Signed)
You can safely take Tylenol for pain.  Follow-up with the vascular surgery doctors at [facility].  They should be calling you to schedule an appointment.  Return to [facility] immediately if you have any severe pain, difficulty breathing or other concerns ?

## 2021-12-29 NOTE — ED Notes (Signed)
Ambulated to restroom with no assist and with a steady gait ?

## 2021-12-29 NOTE — ED Triage Notes (Signed)
Per pt, states he slipped on a wet floor over a week ago injuring his lower back-states OTC meds not helping ?

## 2022-01-06 DIAGNOSIS — I6523 Occlusion and stenosis of bilateral carotid arteries: Secondary | ICD-10-CM | POA: Diagnosis not present

## 2022-01-06 DIAGNOSIS — Z87891 Personal history of nicotine dependence: Secondary | ICD-10-CM | POA: Diagnosis not present

## 2022-01-06 DIAGNOSIS — Z01810 Encounter for preprocedural cardiovascular examination: Secondary | ICD-10-CM | POA: Diagnosis not present

## 2022-01-06 DIAGNOSIS — I723 Aneurysm of iliac artery: Secondary | ICD-10-CM | POA: Diagnosis not present

## 2022-01-06 DIAGNOSIS — I7121 Aneurysm of the ascending aorta, without rupture: Secondary | ICD-10-CM | POA: Diagnosis not present

## 2022-01-06 DIAGNOSIS — E785 Hyperlipidemia, unspecified: Secondary | ICD-10-CM | POA: Diagnosis not present

## 2022-01-06 DIAGNOSIS — I7143 Infrarenal abdominal aortic aneurysm, without rupture: Secondary | ICD-10-CM | POA: Diagnosis not present

## 2022-01-06 DIAGNOSIS — I10 Essential (primary) hypertension: Secondary | ICD-10-CM | POA: Diagnosis not present

## 2022-01-07 DIAGNOSIS — I716 Thoracoabdominal aortic aneurysm, without rupture, unspecified: Secondary | ICD-10-CM | POA: Diagnosis not present

## 2022-03-11 DIAGNOSIS — I129 Hypertensive chronic kidney disease with stage 1 through stage 4 chronic kidney disease, or unspecified chronic kidney disease: Secondary | ICD-10-CM | POA: Diagnosis not present

## 2022-03-11 DIAGNOSIS — R6889 Other general symptoms and signs: Secondary | ICD-10-CM | POA: Diagnosis not present

## 2022-03-11 DIAGNOSIS — N1831 Chronic kidney disease, stage 3a: Secondary | ICD-10-CM | POA: Diagnosis not present

## 2022-03-11 DIAGNOSIS — I714 Abdominal aortic aneurysm, without rupture, unspecified: Secondary | ICD-10-CM | POA: Diagnosis not present

## 2022-03-11 DIAGNOSIS — I712 Thoracic aortic aneurysm, without rupture, unspecified: Secondary | ICD-10-CM | POA: Diagnosis not present

## 2022-03-11 DIAGNOSIS — E1122 Type 2 diabetes mellitus with diabetic chronic kidney disease: Secondary | ICD-10-CM | POA: Diagnosis not present

## 2022-03-18 DIAGNOSIS — I716 Thoracoabdominal aortic aneurysm, without rupture, unspecified: Secondary | ICD-10-CM | POA: Diagnosis not present

## 2022-03-29 ENCOUNTER — Encounter: Payer: Self-pay | Admitting: Family Medicine

## 2022-03-29 ENCOUNTER — Ambulatory Visit (INDEPENDENT_AMBULATORY_CARE_PROVIDER_SITE_OTHER): Payer: Medicare HMO | Admitting: Family Medicine

## 2022-03-29 VITALS — BP 118/68 | HR 64 | Temp 97.9°F | Ht 65.0 in | Wt 156.0 lb

## 2022-03-29 DIAGNOSIS — I10 Essential (primary) hypertension: Secondary | ICD-10-CM | POA: Diagnosis not present

## 2022-03-29 DIAGNOSIS — I7123 Aneurysm of the descending thoracic aorta, without rupture: Secondary | ICD-10-CM

## 2022-03-29 DIAGNOSIS — E538 Deficiency of other specified B group vitamins: Secondary | ICD-10-CM

## 2022-03-29 DIAGNOSIS — E782 Mixed hyperlipidemia: Secondary | ICD-10-CM

## 2022-03-29 DIAGNOSIS — E559 Vitamin D deficiency, unspecified: Secondary | ICD-10-CM

## 2022-03-29 DIAGNOSIS — R7303 Prediabetes: Secondary | ICD-10-CM

## 2022-03-29 DIAGNOSIS — R6889 Other general symptoms and signs: Secondary | ICD-10-CM | POA: Diagnosis not present

## 2022-03-29 DIAGNOSIS — I714 Abdominal aortic aneurysm, without rupture, unspecified: Secondary | ICD-10-CM | POA: Diagnosis not present

## 2022-03-29 LAB — COMPREHENSIVE METABOLIC PANEL
ALT: 10 U/L (ref 0–53)
AST: 18 U/L (ref 0–37)
Albumin: 4.6 g/dL (ref 3.5–5.2)
Alkaline Phosphatase: 71 U/L (ref 39–117)
BUN: 16 mg/dL (ref 6–23)
CO2: 24 mEq/L (ref 19–32)
Calcium: 9.3 mg/dL (ref 8.4–10.5)
Chloride: 106 mEq/L (ref 96–112)
Creatinine, Ser: 1.34 mg/dL (ref 0.40–1.50)
GFR: 48.9 mL/min — ABNORMAL LOW (ref >60.00)
Glucose, Bld: 123 mg/dL — ABNORMAL HIGH (ref 70–99)
Potassium: 4.8 mEq/L (ref 3.5–5.1)
Sodium: 143 mEq/L (ref 135–145)
Total Bilirubin: 0.8 mg/dL (ref 0.2–1.2)
Total Protein: 7.3 g/dL (ref 6.0–8.3)

## 2022-03-29 LAB — LIPID PANEL
Cholesterol: 119 mg/dL (ref 0–200)
HDL: 32.3 mg/dL — ABNORMAL LOW (ref >39.00)
LDL Cholesterol: 56 mg/dL (ref 0–99)
NonHDL: 86.7
Total CHOL/HDL Ratio: 4
Triglycerides: 154 mg/dL — ABNORMAL HIGH (ref 0.0–149.0)
VLDL: 30.8 mg/dL (ref 0.0–40.0)

## 2022-03-29 LAB — CBC
HCT: 36.4 % — ABNORMAL LOW (ref 39.0–52.0)
Hemoglobin: 12 g/dL — ABNORMAL LOW (ref 13.0–17.0)
MCHC: 32.9 g/dL (ref 30.0–36.0)
MCV: 92.7 fl (ref 78.0–100.0)
Platelets: 87 10*3/uL — ABNORMAL LOW (ref 150.0–400.0)
RBC: 3.92 Mil/uL — ABNORMAL LOW (ref 4.22–5.81)
RDW: 14.9 % (ref 11.5–15.5)
WBC: 5 10*3/uL (ref 4.0–10.5)

## 2022-03-29 LAB — TSH: TSH: 1.59 u[IU]/mL (ref 0.35–5.50)

## 2022-03-29 LAB — HEMOGLOBIN A1C: Hgb A1c MFr Bld: 6.6 % — ABNORMAL HIGH (ref 4.6–6.5)

## 2022-03-29 LAB — VITAMIN D 25 HYDROXY (VIT D DEFICIENCY, FRACTURES): VITD: 27.43 ng/mL — ABNORMAL LOW (ref 30.00–100.00)

## 2022-03-29 LAB — VITAMIN B12: Vitamin B-12: 80 pg/mL — ABNORMAL LOW (ref 211–911)

## 2022-03-29 MED ORDER — AMLODIPINE BESYLATE 5 MG PO TABS: 5.0000 mg | ORAL_TABLET | Freq: Two times a day (BID) | ORAL | 3 refills | Status: DC

## 2022-03-29 NOTE — Assessment & Plan Note (Signed)
Check A1c. 

## 2022-03-29 NOTE — Assessment & Plan Note (Signed)
At goal on amlodipine 5 mg twice daily and telmisartan 40 mg daily.  Will refill amlodipine today.

## 2022-03-29 NOTE — Assessment & Plan Note (Signed)
I had a lengthy discussion with patient regarding his recent diagnoses and visits with the vascular specialist at [facility].  He is not interested in surgery at this point.  The vascular surgeon recommended conservative management with blood pressure control and continuing statin.  His blood pressure is well controlled today at 118/68.  We will continue statin check lipids today.  We discussed his recommended activity levels per recommendation from the vascular surgeon.

## 2022-03-29 NOTE — Patient Instructions (Signed)
It was very nice to see you today!  We will check blood work today.  It is very important that we keep your blood pressure and cholesterol at goal.  Please do not do anything exertional that significantly elevates your heart rate.  We will see back in 6 months.  Come back sooner if needed.  Take care, Dr Jimmey Ralph  PLEASE NOTE:  If you had any lab tests please let us know if you have not heard back within a few days. You may see your results on mychart before we have a chance to review them but we will give you a call once they are reviewed by [place]. If we ordered any referrals today, please let us know if you have not heard from their office within the next week.   Please try these tips to maintain a healthy lifestyle:  Eat at least 3 REAL meals and 1-2 snacks per day.  Aim for no more than 5 hours between eating.  If you eat breakfast, please do so within one hour of getting up.   Each meal should contain half fruits/vegetables, one quarter protein, and one quarter carbs (no bigger than a computer mouse)  Cut down on sweet beverages. This includes juice, soda, and sweet tea.   Drink at least 1 glass of water with each meal and aim for at least 8 glasses per day  Exercise at least 150 minutes every week.

## 2022-03-29 NOTE — Assessment & Plan Note (Signed)
Check vitamin D. 

## 2022-03-29 NOTE — Progress Notes (Signed)
   [name] is a 86 y.o. male who presents today for an office visit.  Assessment/Plan:  Chronic Problems Addressed Today: Descending thoracic aortic aneurysm (HCC) I had a lengthy discussion with patient regarding his recent diagnoses and visits with the vascular specialist at [facility].  He is not interested in surgery at this point.  The vascular surgeon recommended conservative management with blood pressure control and continuing statin.  His blood pressure is well controlled today at 118/68.  We will continue statin check lipids today.  We discussed his recommended activity levels per recommendation from the vascular surgeon.  AAA (abdominal aortic aneurysm) (HCC) Decided not to pursue surgery.  See descending thoracic aortic aneurysm A/P.  We spent a significant amount of time today reiterating the medication and nonsurgical management options that was discussed at his vascular surgeon's office visit a couple of weeks ago.  We will continue with strict blood pressure control.  He is aware of activities to avoid.  Hyperlipidemia On Crestor 40 mg daily.  Check lipids.  HTN (hypertension) At goal on amlodipine 5 mg twice daily and telmisartan 40 mg daily.  Will refill amlodipine today.  Vitamin D deficiency Check vitamin D   Vitamin B12 deficiency Check B12.  Pre-diabetes Check A1c.     Subjective:  HPI:  Patient is here for follow-up.  He was last seen in this office 4 months ago for routine follow-up.  About a month after our last visit he went to the ED for back pain after suffering a mechanical fall.  He received a CT scan to evaluate which did not show any fracture however did incidentally find a descending thoracic aortic aneurysm as well as an infrarenal abdominal aortic aneurysm.  He was referred to vascular surgery at [facility].  He has follow-up with them twice.  There was discussion for a staged minimally invasive repair.  Patient and his family decided not to pursue this.  '  He is doing well today.  Occasionally has back pain which is normal for him.  No significant new abdominal pain or chest pain.  He would like to discuss conservative measures to prevent risk of rupture.  He is still not interested in pursuing surgery at this point.       Objective:  Physical Exam: BP 118/68   Pulse 64   Temp 97.9 F (36.6 C) (Temporal)   Ht 5\' 5"  (1.651 m)   Wt 156 lb (70.8 kg)   SpO2 97%   BMI 25.96 kg/m   Gen: No acute distress, resting comfortably CV: Regular rate and rhythm with no murmurs appreciated Pulm: Normal work of breathing, clear to auscultation bilaterally with no crackles, wheezes, or rhonchi Neuro: Grossly normal, moves all extremities Psych: Normal affect and thought content  Time Spent: 45 minutes of total time was spent on the date of the encounter performing the following actions: chart review prior to seeing the patient including recent ED visit and visits with vascular surgery, obtaining history, performing a medically necessary exam, counseling on the treatment plan, placing orders, and documenting in our EHR.        . Katina Degree, MD 03/29/2022 10:02 AM

## 2022-03-29 NOTE — Assessment & Plan Note (Signed)
Check B12 

## 2022-03-29 NOTE — Assessment & Plan Note (Signed)
Decided not to pursue surgery.  See descending thoracic aortic aneurysm A/P.  We spent a significant amount of time today reiterating the medication and nonsurgical management options that was discussed at his vascular surgeon's office visit a couple of weeks ago.  We will continue with strict blood pressure control.  He is aware of activities to avoid.

## 2022-03-29 NOTE — Assessment & Plan Note (Signed)
On Crestor 40 mg daily.  Check lipids. 

## 2022-03-30 NOTE — Progress Notes (Signed)
Please inform patient of the following:  B12 is still very low.  Recommend he start B12 protocol here.    Vitamin D is low but improving.  He should continue 1000 to 2000 IUs daily.  The rest of his blood work is all normal.

## 2022-04-08 ENCOUNTER — Ambulatory Visit (INDEPENDENT_AMBULATORY_CARE_PROVIDER_SITE_OTHER): Payer: Medicare HMO | Admitting: *Deleted

## 2022-04-08 DIAGNOSIS — E538 Deficiency of other specified B group vitamins: Secondary | ICD-10-CM | POA: Diagnosis not present

## 2022-04-08 MED ORDER — CYANOCOBALAMIN 1000 MCG/ML IJ SOLN
1000.0000 ug | Freq: Once | INTRAMUSCULAR | Status: AC
  Administered 2022-04-08: 1000 ug via INTRAMUSCULAR

## 2022-04-08 NOTE — Progress Notes (Signed)
I have reviewed the patient's encounter and agree with the documentation.  Katina Degree. Jimmey Ralph, MD 04/08/2022 3:52 PM

## 2022-04-08 NOTE — Progress Notes (Signed)
Per orders of Dr. Jimmey Ralph, injection of weekly B 12 given in left deltoid per patient preference by Jobe Gibbon, CMA. Patient tolerated injection well. Patient advised to schedule next injection.

## 2022-04-14 ENCOUNTER — Ambulatory Visit (INDEPENDENT_AMBULATORY_CARE_PROVIDER_SITE_OTHER): Payer: Medicare HMO

## 2022-04-14 DIAGNOSIS — E538 Deficiency of other specified B group vitamins: Secondary | ICD-10-CM | POA: Diagnosis not present

## 2022-04-14 MED ORDER — CYANOCOBALAMIN 1000 MCG/ML IJ SOLN
1000.0000 ug | Freq: Once | INTRAMUSCULAR | Status: AC
  Administered 2022-04-14: 1000 ug via INTRAMUSCULAR

## 2022-04-14 NOTE — Progress Notes (Signed)
Pt tolerated b12 well. 

## 2022-04-21 ENCOUNTER — Ambulatory Visit (INDEPENDENT_AMBULATORY_CARE_PROVIDER_SITE_OTHER): Payer: Medicare HMO

## 2022-04-21 DIAGNOSIS — E538 Deficiency of other specified B group vitamins: Secondary | ICD-10-CM

## 2022-04-21 MED ORDER — CYANOCOBALAMIN 1000 MCG/ML IJ SOLN
1000.0000 ug | Freq: Once | INTRAMUSCULAR | Status: AC
  Administered 2022-04-21: 1000 ug via INTRAMUSCULAR

## 2022-04-21 NOTE — Progress Notes (Signed)
Pt present to receive Vitamin B-12 injection in the office today. Was administered in right deltoid and pt tolerated well.

## 2022-04-24 ENCOUNTER — Other Ambulatory Visit: Payer: Self-pay | Admitting: Family Medicine

## 2022-04-28 ENCOUNTER — Ambulatory Visit (INDEPENDENT_AMBULATORY_CARE_PROVIDER_SITE_OTHER): Payer: Medicare HMO

## 2022-04-28 DIAGNOSIS — E538 Deficiency of other specified B group vitamins: Secondary | ICD-10-CM | POA: Diagnosis not present

## 2022-04-28 MED ORDER — CYANOCOBALAMIN 1000 MCG/ML IJ SOLN
1000.0000 ug | Freq: Once | INTRAMUSCULAR | Status: AC
  Administered 2022-04-28: 1000 ug via INTRAMUSCULAR

## 2022-04-28 NOTE — Progress Notes (Signed)
Pt tolerated b12 injection well. 

## 2022-05-08 DIAGNOSIS — H2513 Age-related nuclear cataract, bilateral: Secondary | ICD-10-CM | POA: Diagnosis not present

## 2022-05-08 DIAGNOSIS — R6889 Other general symptoms and signs: Secondary | ICD-10-CM | POA: Diagnosis not present

## 2022-05-27 ENCOUNTER — Ambulatory Visit (INDEPENDENT_AMBULATORY_CARE_PROVIDER_SITE_OTHER): Payer: Medicare HMO

## 2022-05-27 DIAGNOSIS — Z0000 Encounter for general adult medical examination without abnormal findings: Secondary | ICD-10-CM | POA: Diagnosis not present

## 2022-05-27 NOTE — Progress Notes (Addendum)
Virtual Visit via Telephone Note  I connected with  [name] on 05/27/22 at  1:00 PM EDT by telephone and verified that I am speaking with the correct person using two identifiers.  Medicare Annual Wellness visit completed telephonically due to Covid-19 pandemic.   Persons participating in this call: This Health Coach and this patient.   Location: Patient: home  Provider: office    I discussed the limitations, risks, security and privacy concerns of performing an evaluation and management service by telephone and the availability of in person appointments. The patient expressed understanding and agreed to proceed.  Unable to perform video visit due to video visit attempted and failed and/or patient does not have video capability.   Some vital signs may be absent or patient reported.   Marzella Schlein, LPN   Subjective:   [name] is a 86 y.o. male who presents for Medicare Annual/Subsequent preventive examination.  Review of Systems     Cardiac Risk Factors include: advanced age (>46men, >38 women);male gender;dyslipidemia;hypertension     Objective:    There were no vitals filed for this visit. There is no height or weight on file to calculate BMI.     05/27/2022    1:07 PM 12/29/2021    4:46 PM 05/25/2021    1:09 PM 05/16/2020   11:12 AM 07/09/2015    2:34 PM 05/11/2015   10:38 AM 03/02/2012    2:44 AM  Advanced Directives  Does Patient Have a Medical Advance Directive? No No No No No No Patient does not have advance directive;Patient would not like information  Would patient like information on creating a medical advance directive? No - Patient declined  No - Patient declined   No - patient declined information     Current Medications (verified) Outpatient Encounter Medications as of 05/27/2022  Medication Sig   amLODipine (NORVASC) 5 MG tablet Take 1 tablet (5 mg total) by mouth in the morning and at bedtime.   aspirin EC 81 MG tablet Take 1 tablet  (81 mg total) by mouth daily.   Cholecalciferol 50 MCG (2000 UT) CAPS Take by mouth.   furosemide (LASIX) 20 MG tablet TAKE 1 TABLET EVERY DAY   loratadine (CLARITIN) 10 MG tablet Take 10 mg by mouth daily.   olmesartan (BENICAR) 40 MG tablet TAKE 1 TABLET EVERY DAY   olopatadine (PATANOL) 0.1 % ophthalmic solution Place 1 drop into both eyes 2 (two) times daily.   Omega-3 Fatty Acids (FISH OIL) 1000 MG CAPS Take 1 capsule by mouth daily.   rosuvastatin (CRESTOR) 40 MG tablet TAKE 1 TABLET EVERY DAY   montelukast (SINGULAIR) 10 MG tablet TAKE 1 TABLET AT BEDTIME (Patient not taking: Reported on 05/27/2022)   No facility-administered encounter medications on file as of 05/27/2022.    Allergies (verified) Ace inhibitors   History: Past Medical History:  Diagnosis Date   BPH (benign prostatic hyperplasia)    COPD (chronic obstructive pulmonary disease) (HCC)    CVA (cerebral vascular accident) (HCC)    Diabetes mellitus without complication (HCC)    Dizziness and giddiness 07/09/2015   Hyperlipidemia    Hypertension    Renal disorder    Past Surgical History:  Procedure Laterality Date   APPENDECTOMY     TEE WITHOUT CARDIOVERSION  03/10/2012   Procedure: TRANSESOPHAGEAL ECHOCARDIOGRAM (TEE);  Surgeon: Ricki Rodriguez, MD;  Location: [facility] ENDOSCOPY;  Service: Cardiovascular;  Laterality: N/A;   Family History  Problem Relation Age of Onset  Heart attack Father    Cancer Mother    Cancer Sister        brain tumor   Heart attack Brother    Heart attack Brother    Heart disease Brother    Social History   Socioeconomic History   Marital status: Married    Spouse name: Not on file   Number of children: 6   Years of education: HS   Highest education level: Not on file  Occupational History   Occupation: retired  Tobacco Use   Smoking status: Former   Smokeless tobacco: Never  Substance and Sexual Activity   Alcohol use: Yes    Alcohol/week: 0.0 standard drinks of alcohol     Comment: occasionally   Drug use: No   Sexual activity: Not on file  Other Topics Concern   Not on file  Social History Narrative   Patient drinks caffeine occasionally.   Patient is right handed.    Social Determinants of Health   Financial Resource Strain: Low Risk  (05/27/2022)   Overall Financial Resource Strain (CARDIA)    Difficulty of Paying Living Expenses: Not hard at all  Food Insecurity: No Food Insecurity (05/27/2022)   Hunger Vital Sign    Worried About Running Out of Food in the Last Year: Never true    Ran Out of Food in the Last Year: Never true  Transportation Needs: No Transportation Needs (05/27/2022)   PRAPARE - Administrator, Civil Service (Medical): No    Lack of Transportation (Non-Medical): No  Physical Activity: Inactive (05/27/2022)   Exercise Vital Sign    Days of Exercise per Week: 0 days    Minutes of Exercise per Session: 0 min  Stress: No Stress Concern Present (05/27/2022)   [employer] of Occupational Health - Occupational Stress Questionnaire    Feeling of Stress : Not at all  Social Connections: Moderately Isolated (05/27/2022)   Social Connection and Isolation Panel [NHANES]    Frequency of Communication with Friends and Family: More than three times a week    Frequency of Social Gatherings with Friends and Family: Three times a week    Attends Religious Services: Never    Active Member of Clubs or Organizations: No    Attends Engineer, structural: Never    Marital Status: Married    Tobacco Counseling Counseling given: Not Answered   Clinical Intake:  Pre-visit preparation completed: Yes  Pain : No/denies pain     BMI - recorded: 25.96 Nutritional Status: BMI 25 -29 Overweight Nutritional Risks: None Diabetes: No  How often do you need to have someone help you when you read instructions, pamphlets, or other written materials from your doctor or pharmacy?: 1 - Never  Diabetic?no  Interpreter  Needed?: No  Information entered by :: Lanier Ensign, LPN   Activities of Daily Living    05/27/2022    1:08 PM  In your present state of health, do you have any difficulty performing the following activities:  Hearing? 0  Vision? 0  Difficulty concentrating or making decisions? 0  Walking or climbing stairs? 0  Dressing or bathing? 0  Doing errands, shopping? 0  Preparing Food and eating ? N  Using the Toilet? N  In the past six months, have you accidently leaked urine? N  Do you have problems with loss of bowel control? N  Managing your Medications? N  Managing your Finances? N  Housekeeping or managing your Housekeeping? N  Patient Care Team: Ardith Dark, MD as PCP - General (Family Medicine)  Indicate any recent Medical Services you may have received from other than [facility] providers in the past year (date may be approximate).     Assessment:   This is a routine wellness examination for [name].  Hearing/Vision screen Hearing Screening - Comments:: Pt denies any hearing issues  Vision Screening - Comments:: [facility]   Dietary issues and exercise activities discussed: Current Exercise Habits: The patient does not participate in regular exercise at present   Goals Addressed             This Visit's Progress    Patient Stated       Increase water        Depression Screen    05/27/2022    1:06 PM 03/29/2022    9:10 AM 05/25/2021    1:08 PM 09/24/2020    9:55 AM 08/15/2020   10:07 AM 05/16/2020   11:10 AM 02/14/2020   10:36 AM  PHQ 2/9 Scores  PHQ - 2 Score 0 0 0 0 0 0 0    Fall Risk    05/27/2022    1:08 PM 03/29/2022    9:10 AM 05/25/2021    1:10 PM 09/24/2020    9:55 AM 08/15/2020   10:08 AM  Fall Risk   Falls in the past year? 0  0 0 0  Number falls in past yr: 0 0 0    Injury with Fall? 0 0 0    Risk for fall due to : No Fall Risks No Fall Risks  No Fall Risks   Follow up Falls prevention discussed  Falls  prevention discussed      FALL RISK PREVENTION PERTAINING TO THE HOME:  Any stairs in or around the home? Yes  If so, are there any without handrails? No  Home free of loose throw rugs in walkways, pet beds, electrical cords, etc? Yes  Adequate lighting in your home to reduce risk of falls? Yes   ASSISTIVE DEVICES UTILIZED TO PREVENT FALLS:  Life alert? No  Use of a cane, walker or w/c? No  Grab bars in the bathroom? Yes  Shower chair or bench in shower? Yes  Elevated toilet seat or a handicapped toilet? No   TIMED UP AND GO:  Was the test performed? No .   Cognitive Function:        05/27/2022    1:10 PM 05/25/2021    1:12 PM  6CIT Screen  What Year? 0 points 0 points  What month? 0 points 0 points  What time? 0 points 0 points  Count back from 20 0 points 0 points  Months in reverse 0 points 4 points  Repeat phrase 0 points 10 points  Total Score 0 points 14 points    Immunizations Immunization History  Administered Date(s) Administered   Fluad Quad(high Dose 65+) 10/03/2017, 08/04/2018, 08/15/2020, 06/02/2021   Influenza, High Dose Seasonal PF 05/22/2015, 06/09/2019   Influenza,inj,Quad PF,6+ Mos 06/17/2016   PFIZER(Purple Top)SARS-COV-2 Vaccination 10/20/2019, 11/11/2019, 01/28/2021   Pneumococcal Conjugate-13 11/25/2014   Pneumococcal Polysaccharide-23 09/13/2008, 06/17/2016   Tdap 01/21/2014   Zoster, Live 01/21/2014    TDAP status: Up to date  Flu Vaccine status: Up to date  Pneumococcal vaccine status: Up to date  Covid-19 vaccine status: Completed vaccines  Qualifies for Shingles Vaccine? Yes   Zostavax completed No   Shingrix Completed?: No.  Education has been provided regarding the importance of this vaccine. Patient has been advised to call insurance company to determine out of pocket expense if they have not yet received this vaccine. Advised may also receive vaccine at local pharmacy or Health Dept. Verbalized acceptance and  understanding.  Screening Tests Health Maintenance  Topic Date Due   COVID-19 Vaccine (4 - Pfizer risk series) 03/25/2021   INFLUENZA VACCINE  04/13/2022   Zoster Vaccines- Shingrix (1 of 2) 06/29/2022 (Originally 06/19/1955)   TETANUS/TDAP  01/22/2024   Pneumonia Vaccine 58+ Years old  Completed   HPV VACCINES  Aged Out    Health Maintenance  Health Maintenance Due  Topic Date Due   COVID-19 Vaccine (4 - Pfizer risk series) 03/25/2021   INFLUENZA VACCINE  04/13/2022    Colorectal cancer screening: No longer required.  Additional Screening:  Vision Screening: Recommended annual ophthalmology exams for early detection of glaucoma and other disorders of the eye. Is the patient up to date with their annual eye exam?  Yes  Who is the provider or what is the name of the office in which the patient attends annual eye exams? [facility]  If pt is not established with a provider, would they like to be referred to a provider to establish care? No .   Dental Screening: Recommended annual dental exams for proper oral hygiene  Community Resource Referral / Chronic Care Management: CRR required this visit?  No   CCM required this visit?  No      Plan:     I have personally reviewed and noted the following in the patient's chart:   Medical and social history Use of alcohol, tobacco or illicit drugs  Current medications and supplements including opioid prescriptions. Patient is not currently taking opioid prescriptions. Functional ability and status Nutritional status Physical activity Advanced directives List of other physicians Hospitalizations, surgeries, and ER visits in previous 12 months Vitals Screenings to include cognitive, depression, and falls Referrals and appointments  In addition, I have reviewed and discussed with patient certain preventive protocols, quality metrics, and best practice recommendations. A written personalized care plan for preventive services  as well as general preventive health recommendations were provided to patient.     Marzella Schlein, LPN   7/37/1062   Nurse Notes: none

## 2022-05-27 NOTE — Patient Instructions (Signed)
[name] , Thank you for taking time to come for your Medicare Wellness Visit. I appreciate your ongoing commitment to your health goals. Please review the following plan we discussed and let me know if I can assist you in the future.   Screening recommendations/referrals: Colonoscopy: no longer required  Recommended yearly ophthalmology/optometry visit for glaucoma screening and checkup Recommended yearly dental visit for hygiene and checkup  Vaccinations: Influenza vaccine: done 06/02/21 repeat every year  Pneumococcal vaccine: Up to date Tdap vaccine: done 01/21/14 repeat every 10 years  Shingles vaccine: Shingrix is 2 doses 2-6 months apart and over 90% effective    Covid-19: completed 2/6, 11/11/19 & 01/28/21  Advanced directives: Advance directive discussed with you today. Even though you declined this today please call our office should you change your mind and we can give you the proper paperwork for you to fill out.  Conditions/risks identified: wants to increase water   Next appointment: Follow up in one year for your annual wellness visit.   Preventive Care 84 Years and Older, Male Preventive care refers to lifestyle choices and visits with your health care provider that can promote health and wellness. What does preventive care include? A yearly physical exam. This is also called an annual well check. Dental exams once or twice a year. Routine eye exams. Ask your health care provider how often you should have your eyes checked. Personal lifestyle choices, including: Daily care of your teeth and gums. Regular physical activity. Eating a healthy diet. Avoiding tobacco and drug use. Limiting alcohol use. Practicing safe sex. Taking low doses of aspirin every day. Taking vitamin and mineral supplements as recommended by your health care provider. What happens during an annual well check? The services and screenings done by your health care provider during your annual  well check will depend on your age, overall health, lifestyle risk factors, and family history of disease. Counseling  Your health care provider may ask you questions about your: Alcohol use. Tobacco use. Drug use. Emotional well-being. Home and relationship well-being. Sexual activity. Eating habits. History of falls. Memory and ability to understand (cognition). Work and work Astronomer. Screening  You may have the following tests or measurements: Height, weight, and BMI. Blood pressure. Lipid and cholesterol levels. These may be checked every 5 years, or more frequently if you are over 84 years old. Skin check. Lung cancer screening. You may have this screening every year starting at age 84 if you have a 30-pack-year history of smoking and currently smoke or have quit within the past 15 years. Fecal occult blood test (FOBT) of the stool. You may have this test every year starting at age 84. Flexible sigmoidoscopy or colonoscopy. You may have a sigmoidoscopy every 5 years or a colonoscopy every 10 years starting at age 84. Prostate cancer screening. Recommendations will vary depending on your family history and other risks. Hepatitis C blood test. Hepatitis B blood test. Sexually transmitted disease (STD) testing. Diabetes screening. This is done by checking your blood sugar (glucose) after you have not eaten for a while (fasting). You may have this done every 1-3 years. Abdominal aortic aneurysm (AAA) screening. You may need this if you are a current or former smoker. Osteoporosis. You may be screened starting at age 84 if you are at high risk. Talk with your health care provider about your test results, treatment options, and if necessary, the need for more tests. Vaccines  Your health care provider may recommend certain vaccines, such as:  Influenza vaccine. This is recommended every year. Tetanus, diphtheria, and acellular pertussis (Tdap, Td) vaccine. You may need a Td booster  every 10 years. Zoster vaccine. You may need this after age 84. Pneumococcal 13-valent conjugate (PCV13) vaccine. One dose is recommended after age 84. Pneumococcal polysaccharide (PPSV23) vaccine. One dose is recommended after age 84. Talk to your health care provider about which screenings and vaccines you need and how often you need them. This information is not intended to replace advice given to you by your health care provider. Make sure you discuss any questions you have with your health care provider. Document Released: 09/26/2015 Document Revised: 05/19/2016 Document Reviewed: 07/01/2015 Elsevier Interactive Patient Education  2017 [employer].  Fall Prevention in the Home Falls can cause injuries. They can happen to people of all ages. There are many things you can do to make your home safe and to help prevent falls. What can I do on the outside of my home? Regularly fix the edges of walkways and driveways and fix any cracks. Remove anything that might make you trip as you walk through a door, such as a raised step or threshold. Trim any bushes or trees on the path to your home. Use bright outdoor lighting. Clear any walking paths of anything that might make someone trip, such as rocks or tools. Regularly check to see if handrails are loose or broken. Make sure that both sides of any steps have handrails. Any raised decks and porches should have guardrails on the edges. Have any leaves, snow, or ice cleared regularly. Use sand or salt on walking paths during winter. Clean up any spills in your garage right away. This includes oil or grease spills. What can I do in the bathroom? Use night lights. Install grab bars by the toilet and in the tub and shower. Do not use towel bars as grab bars. Use non-skid mats or decals in the tub or shower. If you need to sit down in the shower, use a plastic, non-slip stool. Keep the floor dry. Clean up any water that spills on the floor as soon  as it happens. Remove soap buildup in the tub or shower regularly. Attach bath mats securely with double-sided non-slip rug tape. Do not have throw rugs and other things on the floor that can make you trip. What can I do in the bedroom? Use night lights. Make sure that you have a light by your bed that is easy to reach. Do not use any sheets or blankets that are too big for your bed. They should not hang down onto the floor. Have a firm chair that has side arms. You can use this for support while you get dressed. Do not have throw rugs and other things on the floor that can make you trip. What can I do in the kitchen? Clean up any spills right away. Avoid walking on wet floors. Keep items that you use a lot in easy-to-reach places. If you need to reach something above you, use a strong step stool that has a grab bar. Keep electrical cords out of the way. Do not use floor polish or wax that makes floors slippery. If you must use wax, use non-skid floor wax. Do not have throw rugs and other things on the floor that can make you trip. What can I do with my stairs? Do not leave any items on the stairs. Make sure that there are handrails on both sides of the stairs and use them.  Fix handrails that are broken or loose. Make sure that handrails are as long as the stairways. Check any carpeting to make sure that it is firmly attached to the stairs. Fix any carpet that is loose or worn. Avoid having throw rugs at the top or bottom of the stairs. If you do have throw rugs, attach them to the floor with carpet tape. Make sure that you have a light switch at the top of the stairs and the bottom of the stairs. If you do not have them, ask someone to add them for you. What else can I do to help prevent falls? Wear shoes that: Do not have high heels. Have rubber bottoms. Are comfortable and fit you well. Are closed at the toe. Do not wear sandals. If you use a stepladder: Make sure that it is fully  opened. Do not climb a closed stepladder. Make sure that both sides of the stepladder are locked into place. Ask someone to hold it for you, if possible. Clearly mark and make sure that you can see: Any grab bars or handrails. First and last steps. Where the edge of each step is. Use tools that help you move around (mobility aids) if they are needed. These include: Canes. Walkers. Scooters. Crutches. Turn on the lights when you go into a dark area. Replace any light bulbs as soon as they burn out. Set up your furniture so you have a clear path. Avoid moving your furniture around. If any of your floors are uneven, fix them. If there are any pets around you, be aware of where they are. Review your medicines with your doctor. Some medicines can make you feel dizzy. This can increase your chance of falling. Ask your doctor what other things that you can do to help prevent falls. This information is not intended to replace advice given to you by your health care provider. Make sure you discuss any questions you have with your health care provider. Document Released: 06/26/2009 Document Revised: 02/05/2016 Document Reviewed: 10/04/2014 Elsevier Interactive Patient Education  2017 [employer].

## 2022-05-28 ENCOUNTER — Ambulatory Visit: Payer: Medicare HMO | Admitting: Family Medicine

## 2022-06-07 ENCOUNTER — Ambulatory Visit: Payer: Medicare HMO

## 2022-06-07 ENCOUNTER — Encounter: Payer: Self-pay | Admitting: *Deleted

## 2022-07-28 ENCOUNTER — Telehealth: Payer: Self-pay | Admitting: *Deleted

## 2022-07-28 ENCOUNTER — Encounter: Payer: Self-pay | Admitting: *Deleted

## 2022-07-28 NOTE — Patient Instructions (Signed)
Visit Information  Thank you for taking time to visit with me today. Please don't hesitate to contact me if I can be of assistance to you.   Following are the goals we discussed today:   Goals Addressed               This Visit's Progress     COMPLETED: No needs at this time (pt-stated)        Care Coordination Interventions: Reviewed medications with patient and discussed adherence with all medications with no needed refills Screening for signs and symptoms of depression related to chronic disease state  Assessed social determinant of health barriers         Please call the care guide team at [phone] if you need to cancel or reschedule your appointment.   If you are experiencing a Mental Health or Behavioral Health Crisis or need someone to talk to, please call the Suicide and Crisis Lifeline: 988  Patient verbalizes understanding of instructions and care plan provided today and agrees to view in MyChart. Active MyChart status and patient understanding of how to access instructions and care plan via MyChart confirmed with patient.     No further follow up required: No needs  Elliot Cousin, RN Care Management Coordinator [facility] [employer] Main Office [phone]

## 2022-07-28 NOTE — Patient Outreach (Signed)
  Care Coordination   Initial Visit Note   07/28/2022 Name: [name] MRN: [id number] DOB: Jul 06, 1936  [name] is a 86 y.o. year old male who sees Ardith Dark, MD for primary care. I spoke with  [name] by phone today.  What matters to the patients health and wellness today?  No needs    Goals Addressed               This Visit's Progress     COMPLETED: No needs at this time (pt-stated)        Care Coordination Interventions: Reviewed medications with patient and discussed adherence with all medications with no needed refills Screening for signs and symptoms of depression related to chronic disease state  Assessed social determinant of health barriers         SDOH assessments and interventions completed:  Yes  SDOH Interventions Today    Flowsheet Row Most Recent Value  SDOH Interventions   Food Insecurity Interventions Intervention Not Indicated  Housing Interventions Intervention Not Indicated  Transportation Interventions Intervention Not Indicated  Utilities Interventions Intervention Not Indicated        Care Coordination Interventions Activated:  Yes  Care Coordination Interventions:  Yes, provided   Follow up plan: No further intervention required.   Encounter Outcome:  Pt. Visit Completed   Elliot Cousin, RN Care Management Coordinator [facility] Main Office [phone]

## 2022-08-26 ENCOUNTER — Encounter: Payer: Self-pay | Admitting: *Deleted

## 2022-09-28 ENCOUNTER — Ambulatory Visit: Payer: Medicare HMO | Admitting: Family Medicine

## 2022-10-07 ENCOUNTER — Ambulatory Visit (INDEPENDENT_AMBULATORY_CARE_PROVIDER_SITE_OTHER): Payer: Medicare HMO | Admitting: Family Medicine

## 2022-10-07 VITALS — BP 100/65 | HR 74 | Temp 97.3°F | Ht 65.0 in | Wt 162.4 lb

## 2022-10-07 DIAGNOSIS — E538 Deficiency of other specified B group vitamins: Secondary | ICD-10-CM | POA: Diagnosis not present

## 2022-10-07 DIAGNOSIS — E559 Vitamin D deficiency, unspecified: Secondary | ICD-10-CM | POA: Diagnosis not present

## 2022-10-07 DIAGNOSIS — Z23 Encounter for immunization: Secondary | ICD-10-CM

## 2022-10-07 DIAGNOSIS — I10 Essential (primary) hypertension: Secondary | ICD-10-CM | POA: Diagnosis not present

## 2022-10-07 DIAGNOSIS — R7303 Prediabetes: Secondary | ICD-10-CM

## 2022-10-07 DIAGNOSIS — E782 Mixed hyperlipidemia: Secondary | ICD-10-CM | POA: Diagnosis not present

## 2022-10-07 DIAGNOSIS — N401 Enlarged prostate with lower urinary tract symptoms: Secondary | ICD-10-CM

## 2022-10-07 DIAGNOSIS — R6889 Other general symptoms and signs: Secondary | ICD-10-CM | POA: Diagnosis not present

## 2022-10-07 DIAGNOSIS — D696 Thrombocytopenia, unspecified: Secondary | ICD-10-CM

## 2022-10-07 DIAGNOSIS — Z0001 Encounter for general adult medical examination with abnormal findings: Secondary | ICD-10-CM

## 2022-10-07 DIAGNOSIS — J449 Chronic obstructive pulmonary disease, unspecified: Secondary | ICD-10-CM

## 2022-10-07 DIAGNOSIS — N1831 Chronic kidney disease, stage 3a: Secondary | ICD-10-CM | POA: Diagnosis not present

## 2022-10-07 DIAGNOSIS — R351 Nocturia: Secondary | ICD-10-CM

## 2022-10-07 LAB — LIPID PANEL
Cholesterol: 128 mg/dL (ref 0–200)
HDL: 32.7 mg/dL — ABNORMAL LOW (ref >39.00)
LDL Cholesterol: 59 mg/dL (ref 0–99)
NonHDL: 95.74
Total CHOL/HDL Ratio: 4
Triglycerides: 183 mg/dL — ABNORMAL HIGH (ref 0.0–149.0)
VLDL: 36.6 mg/dL (ref 0.0–40.0)

## 2022-10-07 LAB — CBC
HCT: 34.5 % — ABNORMAL LOW (ref 39.0–52.0)
Hemoglobin: 11.7 g/dL — ABNORMAL LOW (ref 13.0–17.0)
MCHC: 33.7 g/dL (ref 30.0–36.0)
MCV: 90.9 fl (ref 78.0–100.0)
Platelets: 98 10*3/uL — ABNORMAL LOW (ref 150.0–400.0)
RBC: 3.8 Mil/uL — ABNORMAL LOW (ref 4.22–5.81)
RDW: 14.8 % (ref 11.5–15.5)
WBC: 5.5 10*3/uL (ref 4.0–10.5)

## 2022-10-07 LAB — COMPREHENSIVE METABOLIC PANEL
ALT: 8 U/L (ref 0–53)
AST: 13 U/L (ref 0–37)
Albumin: 4.2 g/dL (ref 3.5–5.2)
Alkaline Phosphatase: 73 U/L (ref 39–117)
BUN: 27 mg/dL — ABNORMAL HIGH (ref 6–23)
CO2: 26 mEq/L (ref 19–32)
Calcium: 9.3 mg/dL (ref 8.4–10.5)
Chloride: 104 mEq/L (ref 96–112)
Creatinine, Ser: 1.51 mg/dL — ABNORMAL HIGH (ref 0.40–1.50)
GFR: 42.21 mL/min — ABNORMAL LOW (ref >60.00)
Glucose, Bld: 122 mg/dL — ABNORMAL HIGH (ref 70–99)
Potassium: 4.4 mEq/L (ref 3.5–5.1)
Sodium: 139 mEq/L (ref 135–145)
Total Bilirubin: 0.6 mg/dL (ref 0.2–1.2)
Total Protein: 7.1 g/dL (ref 6.0–8.3)

## 2022-10-07 LAB — HEMOGLOBIN A1C: Hgb A1c MFr Bld: 6.5 % (ref 4.6–6.5)

## 2022-10-07 LAB — TSH: TSH: 2.29 u[IU]/mL (ref 0.35–5.50)

## 2022-10-07 LAB — VITAMIN D 25 HYDROXY (VIT D DEFICIENCY, FRACTURES): VITD: 45.45 ng/mL (ref 30.00–100.00)

## 2022-10-07 LAB — VITAMIN B12: Vitamin B-12: 130 pg/mL — ABNORMAL LOW (ref 211–911)

## 2022-10-07 NOTE — Progress Notes (Signed)
Chief Complaint:  [name] is a 87 y.o. male who presents today for his annual comprehensive physical exam.    Assessment/Plan:  Chronic Problems Addressed Today: HTN (hypertension) Blood pressure at goal on amlodipine 5 mg twice daily and olmesartan 40 mg daily.  Hyperlipidemia Check labs.  On Crestor 40 mg daily.  Pre-diabetes Check A1c.  Chronic obstructive pulmonary disease (HCC) Doing well off meds.  Stage 3a chronic kidney disease Check c-Met.  Thrombocytopenia (HCC) Check CBC.   Vitamin B12 deficiency He has complete.  B12 replacement protocol here.  He is taking oral B12 now.  Check B12 level.  Vitamin D deficiency Check vitamin D.  Preventative Healthcare: Check labs.  Up-to-date on vaccines and screenings.  Patient Counseling(The following topics were reviewed and/or handout was given):  -Nutrition: Stressed importance of moderation in sodium/caffeine intake, saturated fat and cholesterol, caloric balance, sufficient intake of fresh fruits, vegetables, and fiber.  -Stressed the importance of regular exercise.   -Substance Abuse: Discussed cessation/primary prevention of tobacco, alcohol, or other drug use; driving or other dangerous activities under the influence; availability of treatment for abuse.   -Injury prevention: Discussed safety belts, safety helmets, smoke detector, smoking near bedding or upholstery.   -Sexuality: Discussed sexually transmitted diseases, partner selection, use of condoms, avoidance of unintended pregnancy and contraceptive alternatives.   -Dental health: Discussed importance of regular tooth brushing, flossing, and dental visits.  -Health maintenance and immunizations reviewed. Please refer to Health maintenance section.  Return to care in 1 year for next preventative visit.     Subjective:  HPI:  He has no acute complaints today. See A/P for status of chronic conditions.  Lifestyle Diet: Balanced. Plenty of fruits  and vegetables.  Exercise: None specific. Likes walking.      10/07/2022    9:39 AM  Depression screen PHQ 2/9  Decreased Interest 0  Down, Depressed, Hopeless 0  PHQ - 2 Score 0   ROS: Per HPI, otherwise a complete review of systems was negative.   PMH:  The following were reviewed and entered/updated in epic: Past Medical History:  Diagnosis Date   BPH (benign prostatic hyperplasia)    COPD (chronic obstructive pulmonary disease) (HCC)    CVA (cerebral vascular accident) ([place])    Diabetes mellitus without complication ([place])    Dizziness and giddiness 07/09/2015   Hyperlipidemia    Hypertension    Renal disorder    Patient Active Problem List   Diagnosis Date Noted   Descending thoracic aortic aneurysm ([place]) 03/29/2022   AAA (abdominal aortic aneurysm) ([place]) 03/29/2022   Vitamin B12 deficiency 11/30/2021   Vitamin D deficiency 11/30/2021   Pre-diabetes 02/14/2020   Thrombocytopenia ([place]) 12/12/2018   Chronic obstructive pulmonary disease ([place]) 11/14/2014   Benign prostatic hyperplasia with nocturia 11/14/2014   Primary osteoarthritis involving multiple joints 11/14/2014   Former heavy tobacco smoker 12/04/2013   Normocytic anemia 03/02/2012   CVA (cerebral vascular accident) ([place]) 03/01/2012   HTN (hypertension) 03/01/2012   Hyperlipidemia 03/01/2012   Stage 3a chronic kidney disease ([place]) 03/01/2012   Past Surgical History:  Procedure Laterality Date   APPENDECTOMY     TEE WITHOUT CARDIOVERSION  03/10/2012   Procedure: TRANSESOPHAGEAL ECHOCARDIOGRAM (TEE);  Surgeon: Birdie Riddle, MD;  Location: [facility] ENDOSCOPY;  Service: Cardiovascular;  Laterality: N/A;    Family History  Problem Relation Age of Onset   Heart attack Father    Cancer Mother    Cancer Sister  brain tumor   Heart attack Brother    Heart attack Brother    Heart disease Brother     Medications- reviewed and updated Current Outpatient Medications  Medication Sig Dispense Refill    amLODipine (NORVASC) 5 MG tablet Take 1 tablet (5 mg total) by mouth in the morning and at bedtime. 180 tablet 3   aspirin EC 81 MG tablet Take 1 tablet (81 mg total) by mouth daily. 90 tablet 1   calcium-vitamin D (OSCAL WITH D) 500-5 MG-MCG tablet Take 1 tablet by mouth.     furosemide (LASIX) 20 MG tablet TAKE 1 TABLET EVERY DAY 90 tablet 1   olmesartan (BENICAR) 40 MG tablet TAKE 1 TABLET EVERY DAY 90 tablet 1   olopatadine (PATANOL) 0.1 % ophthalmic solution Place 1 drop into both eyes 2 (two) times daily.     Omega-3 Fatty Acids (FISH OIL) 1000 MG CAPS Take 1 capsule by mouth daily.     rosuvastatin (CRESTOR) 40 MG tablet TAKE 1 TABLET EVERY DAY 90 tablet 3   No current facility-administered medications for this visit.    Allergies-reviewed and updated Allergies  Allergen Reactions   Ace Inhibitors     cough    Social History   Socioeconomic History   Marital status: Married    Spouse name: Not on file   Number of children: 6   Years of education: HS   Highest education level: Not on file  Occupational History   Occupation: retired  Tobacco Use   Smoking status: Former   Smokeless tobacco: Never  Substance and Sexual Activity   Alcohol use: Yes    Alcohol/week: 0.0 standard drinks of alcohol    Comment: occasionally   Drug use: No   Sexual activity: Not on file  Other Topics Concern   Not on file  Social History Narrative   Patient drinks caffeine occasionally.   Patient is right handed.    Social Determinants of Health   Financial Resource Strain: Low Risk  (05/27/2022)   Overall Financial Resource Strain (CARDIA)    Difficulty of Paying Living Expenses: Not hard at all  Food Insecurity: No Food Insecurity (07/28/2022)   Hunger Vital Sign    Worried About Running Out of Food in the Last Year: Never true    Ran Out of Food in the Last Year: Never true  Transportation Needs: No Transportation Needs (07/28/2022)   PRAPARE - Scientist, research (physical sciences) (Medical): No    Lack of Transportation (Non-Medical): No  Physical Activity: Inactive (05/27/2022)   Exercise Vital Sign    Days of Exercise per Week: 0 days    Minutes of Exercise per Session: 0 min  Stress: No Stress Concern Present (05/27/2022)   [employer] of Occupational Health - Occupational Stress Questionnaire    Feeling of Stress : Not at all  Social Connections: Moderately Isolated (05/27/2022)   Social Connection and Isolation Panel [NHANES]    Frequency of Communication with Friends and Family: More than three times a week    Frequency of Social Gatherings with Friends and Family: Three times a week    Attends Religious Services: Never    Active Member of Clubs or Organizations: No    Attends Banker Meetings: Never    Marital Status: Married        Objective:  Physical Exam: BP 100/65   Pulse 74   Temp (!) 97.3 F (36.3 C)   Ht 5\' 5"  (1.651  m)   Wt 162 lb 6.4 oz (73.7 kg)   SpO2 96%   BMI 27.02 kg/m   Body mass index is 27.02 kg/m. Wt Readings from Last 3 Encounters:  10/07/22 162 lb 6.4 oz (73.7 kg)  03/29/22 156 lb (70.8 kg)  12/29/21 152 lb (68.9 kg)   Gen: NAD, resting comfortably HEENT: TMs normal bilaterally. OP clear. No thyromegaly noted.  CV: RRR with no murmurs appreciated Pulm: NWOB, CTAB with no crackles, wheezes, or rhonchi GI: Normal bowel sounds present. Soft, Nontender, Nondistended. MSK: no edema, cyanosis, or clubbing noted Skin: warm, dry Neuro: CN2-12 grossly intact. Strength 5/5 in upper and lower extremities. Reflexes symmetric and intact bilaterally.  Psych: Normal affect and thought content     Yaretzi Ernandez M. Jerline Pain, MD 10/07/2022 10:33 AM

## 2022-10-07 NOTE — Assessment & Plan Note (Addendum)
Check labs.  On Crestor 40 mg daily.

## 2022-10-07 NOTE — Assessment & Plan Note (Signed)
He has complete.  B12 replacement protocol here.  He is taking oral B12 now.  Check B12 level.

## 2022-10-07 NOTE — Assessment & Plan Note (Signed)
Check vitamin D.

## 2022-10-07 NOTE — Patient Instructions (Signed)
It was very nice to see you today!  We will check blood work today.  No medication changes today.  Please continue to work on diet and exercise.  Will see back in 6 months.  Come back sooner if needed.  Take care, Dr Jerline Pain  PLEASE NOTE:  If you had any lab tests, please let us know if you have not heard back within a few days. You may see your results on mychart before we have a chance to review them but we will give you a call once they are reviewed by [place].   If we ordered any referrals today, please let us know if you have not heard from their office within the next week.   If you had any urgent prescriptions sent in today, please check with the pharmacy within an hour of our visit to make sure the prescription was transmitted appropriately.   Please try these tips to maintain a healthy lifestyle:  Eat at least 3 REAL meals and 1-2 snacks per day.  Aim for no more than 5 hours between eating.  If you eat breakfast, please do so within one hour of getting up.   Each meal should contain half fruits/vegetables, one quarter protein, and one quarter carbs (no bigger than a computer mouse)  Cut down on sweet beverages. This includes juice, soda, and sweet tea.   Drink at least 1 glass of water with each meal and aim for at least 8 glasses per day  Exercise at least 150 minutes every week.    Preventive Care 25 Years and Older, Male Preventive care refers to lifestyle choices and visits with your health care provider that can promote health and wellness. Preventive care visits are also called wellness exams. What can I expect for my preventive care visit? Counseling During your preventive care visit, your health care provider may ask about your: Medical history, including: Past medical problems. Family medical history. History of falls. Current health, including: Emotional well-being. Home life and relationship well-being. Sexual activity. Memory and ability to understand  (cognition). Lifestyle, including: Alcohol, nicotine or tobacco, and drug use. Access to firearms. Diet, exercise, and sleep habits. Work and work Statistician. Sunscreen use. Safety issues such as seatbelt and bike helmet use. Physical exam Your health care provider will check your: Height and weight. These may be used to calculate your BMI (body mass index). BMI is a measurement that tells if you are at a healthy weight. Waist circumference. This measures the distance around your waistline. This measurement also tells if you are at a healthy weight and may help predict your risk of certain diseases, such as type 2 diabetes and high blood pressure. Heart rate and blood pressure. Body temperature. Skin for abnormal spots. What immunizations do I need?  Vaccines are usually given at various ages, according to a schedule. Your health care provider will recommend vaccines for you based on your age, medical history, and lifestyle or other factors, such as travel or where you work. What tests do I need? Screening Your health care provider may recommend screening tests for certain conditions. This may include: Lipid and cholesterol levels. Diabetes screening. This is done by checking your blood sugar (glucose) after you have not eaten for a while (fasting). Hepatitis C test. Hepatitis B test. HIV (human immunodeficiency virus) test. STI (sexually transmitted infection) testing, if you are at risk. Lung cancer screening. Colorectal cancer screening. Prostate cancer screening. Abdominal aortic aneurysm (AAA) screening. You may need this if you  are a current or former smoker. Talk with your health care provider about your test results, treatment options, and if necessary, the need for more tests. Follow these instructions at home: Eating and drinking  Eat a diet that includes fresh fruits and vegetables, whole grains, lean protein, and low-fat dairy products. Limit your intake of foods with  high amounts of sugar, saturated fats, and salt. Take vitamin and mineral supplements as recommended by your health care provider. Do not drink alcohol if your health care provider tells you not to drink. If you drink alcohol: Limit how much you have to 0-2 drinks a day. Know how much alcohol is in your drink. In the [place] one drink equals one 12 oz bottle of beer (355 mL), one 5 oz glass of wine (148 mL), or one 1 oz glass of hard liquor (44 mL). Lifestyle Brush your teeth every morning and night with fluoride toothpaste. Floss one time each day. Exercise for at least 30 minutes 5 or more days each week. Do not use any products that contain nicotine or tobacco. These products include cigarettes, chewing tobacco, and vaping devices, such as e-cigarettes. If you need help quitting, ask your health care provider. Do not use drugs. If you are sexually active, practice safe sex. Use a condom or other form of protection to prevent STIs. Take aspirin only as told by your health care provider. Make sure that you understand how much to take and what form to take. Work with your health care provider to find out whether it is safe and beneficial for you to take aspirin daily. Ask your health care provider if you need to take a cholesterol-lowering medicine (statin). Find healthy ways to manage stress, such as: Meditation, yoga, or listening to music. Journaling. Talking to a trusted person. Spending time with friends and family. Safety Always wear your seat belt while driving or riding in a vehicle. Do not drive: If you have been drinking alcohol. Do not ride with someone who has been drinking. When you are tired or distracted. While texting. If you have been using any mind-altering substances or drugs. Wear a helmet and other protective equipment during sports activities. If you have firearms in your house, make sure you follow all gun safety procedures. Minimize exposure to UV radiation to  reduce your risk of skin cancer. What's next? Visit your health care provider once a year for an annual wellness visit. Ask your health care provider how often you should have your eyes and teeth checked. Stay up to date on all vaccines. This information is not intended to replace advice given to you by your health care provider. Make sure you discuss any questions you have with your health care provider. Document Revised: 02/25/2021 Document Reviewed: 02/25/2021 Elsevier Patient Education  [place].

## 2022-10-07 NOTE — Assessment & Plan Note (Signed)
Check c-Met. 

## 2022-10-07 NOTE — Assessment & Plan Note (Signed)
Doing well off meds.

## 2022-10-07 NOTE — Assessment & Plan Note (Signed)
Blood pressure at goal on amlodipine 5 mg twice daily and olmesartan 40 mg daily.

## 2022-10-07 NOTE — Assessment & Plan Note (Signed)
Check A1c 

## 2022-10-07 NOTE — Assessment & Plan Note (Signed)
Check CBC 

## 2022-10-08 NOTE — Progress Notes (Signed)
Please inform patient of the following:  His B12 is still a bit low.  Please make sure that he is taking 1000 mcg daily.  He can increase the dose to 2000 mcg daily if he is already doing this.  We should recheck this again in a few months.  His blood sugar looks better than last time.  His blood counts are all stable.  His kidney function is up a little bit since last time.  Would like you may be a little dehydrated.  Please make sure that he is getting plenty of fluids and have him come back in a couple weeks to recheck be met.  We can recheck everything else in about 6 months.

## 2022-11-22 ENCOUNTER — Encounter: Payer: Self-pay | Admitting: Family Medicine

## 2022-11-22 NOTE — Telephone Encounter (Signed)
Please advise 

## 2022-11-23 NOTE — Telephone Encounter (Signed)
I think it is fine for him to travel but recommend non strenuous activity as his vascular surgeon recommended. He can reach out to the surgeon's team to see if there any other concerns about travel.  Algis Greenhouse. Jerline Pain, MD 11/23/2022 1:05 PM

## 2022-12-21 ENCOUNTER — Ambulatory Visit (INDEPENDENT_AMBULATORY_CARE_PROVIDER_SITE_OTHER): Payer: Medicare HMO | Admitting: Family Medicine

## 2022-12-21 ENCOUNTER — Encounter: Payer: Self-pay | Admitting: Family Medicine

## 2022-12-21 VITALS — BP 119/76 | HR 76 | Temp 97.5°F | Ht 65.0 in | Wt 163.6 lb

## 2022-12-21 DIAGNOSIS — J449 Chronic obstructive pulmonary disease, unspecified: Secondary | ICD-10-CM

## 2022-12-21 DIAGNOSIS — R600 Localized edema: Secondary | ICD-10-CM | POA: Diagnosis not present

## 2022-12-21 DIAGNOSIS — I10 Essential (primary) hypertension: Secondary | ICD-10-CM | POA: Diagnosis not present

## 2022-12-21 DIAGNOSIS — R6889 Other general symptoms and signs: Secondary | ICD-10-CM | POA: Diagnosis not present

## 2022-12-21 DIAGNOSIS — I714 Abdominal aortic aneurysm, without rupture, unspecified: Secondary | ICD-10-CM

## 2022-12-21 MED ORDER — MONTELUKAST SODIUM 10 MG PO TABS: 10.0000 mg | ORAL_TABLET | Freq: Every day | ORAL | 3 refills | Status: DC

## 2022-12-21 NOTE — Assessment & Plan Note (Signed)
Has followed with vascular surgery for this.  Does not wish to pursue surgery at this point.  He is avoiding strenuous activity.  Will continue with strict blood pressure control and they will follow-up with me next week to recheck blood pressure since we are stopping amlodipine today.  They will monitor at home and let us know if he has any spikes or elevations over the next couple days with stopping the amlodipine.

## 2022-12-21 NOTE — Assessment & Plan Note (Addendum)
Likely multifactorial in setting of venous insufficiency, medication effects, and CKD.  No red flag signs or symptoms.  No signs of CHF or DVT.    We will stop his amlodipine to see if this helps with his symptoms.  Will also recommend leg elevation and salt avoidance.  He can use compression stockings as needed as well.  He will follow-up with me next week.

## 2022-12-21 NOTE — Patient Instructions (Signed)
It was very nice to see you today!  Your foot swelling is probably due to your blood pressure medication.  Please stop the amlodipine.  Please keep an eye on your blood pressure and let us know if it is persistently higher than 140/90.  Please try the Singulair to help with your cough and congestion.  I will see you back next week.  Come back sooner if needed.  Take care, Dr Jimmey Ralph  PLEASE NOTE:  If you had any lab tests, please let us know if you have not heard back within a few days. You may see your results on mychart before we have a chance to review them but we will give you a call once they are reviewed by [place].   If we ordered any referrals today, please let us know if you have not heard from their office within the next week.   If you had any urgent prescriptions sent in today, please check with the pharmacy within an hour of our visit to make sure the prescription was transmitted appropriately.   Please try these tips to maintain a healthy lifestyle:  Eat at least 3 REAL meals and 1-2 snacks per day.  Aim for no more than 5 hours between eating.  If you eat breakfast, please do so within one hour of getting up.   Each meal should contain half fruits/vegetables, one quarter protein, and one quarter carbs (no bigger than a computer mouse)  Cut down on sweet beverages. This includes juice, soda, and sweet tea.   Drink at least 1 glass of water with each meal and aim for at least 8 glasses per day  Exercise at least 150 minutes every week.

## 2022-12-21 NOTE — Assessment & Plan Note (Addendum)
Blood pressure at goal today.  We will be stopping his amlodipine due to lower extremity edema.  Continue Benicar 40 mg daily.  Due to his AAA we will need to maintain strict blood pressure control.  They will monitor his blood pressure at home and let us know if elevated above 140/90.  They will come back next week for blood pressure recheck.  If needs additional antihypertensives would consider HCTZ versus beta-blocker.

## 2022-12-21 NOTE — Progress Notes (Signed)
   Dorion Ballowe is a 87 y.o. male who presents today for an office visit.  Assessment/Plan:  Chronic Problems Addressed Today: Leg edema Likely multifactorial in setting of venous insufficiency, medication effects, and CKD.  No red flag signs or symptoms.  No signs of CHF or DVT.    We will stop his amlodipine to see if this helps with his symptoms.  Will also recommend leg elevation and salt avoidance.  He can use compression stockings as needed as well.  He will follow-up with me next week.   HTN (hypertension) Blood pressure at goal today.  We will be stopping his amlodipine due to lower extremity edema.  Continue Benicar 40 mg daily.  Due to his AAA we will need to maintain strict blood pressure control.  They will monitor his blood pressure at home and let us know if elevated above 140/90.  They will come back next week for blood pressure recheck.  If needs additional antihypertensives would consider HCTZ versus beta-blocker.  Chronic obstructive pulmonary disease (HCC) Slightly worsened recently.  Potentially due to the recent season changes.  We did discuss restarting inhaler however he declined for now.  Having more cough and congestion at night.  We will start Singulair.  He has done well with this previously.  AAA (abdominal aortic aneurysm) (HCC) Has followed with vascular surgery for this.  Does not wish to pursue surgery at this point.  He is avoiding strenuous activity.  Will continue with strict blood pressure control and they will follow-up with me next week to recheck blood pressure since we are stopping amlodipine today.  They will monitor at home and let us know if he has any spikes or elevations over the next couple days with stopping the amlodipine.    Subjective:  HPI:  See A/P for status of chronic conditions.  Patient is here with bilateral foot swelling Started 1-2 weeks ago. Some pain in left foot No obvious injuries or precipitating events. Tried tylenol with  some improvement in pain.  No shortness of breath.  No orthopnea.  No calf swelling.       Objective:  Physical Exam: BP 119/76   Pulse 76   Temp (!) 97.5 F (36.4 C) (Temporal)   Ht 5\' 5"  (1.651 m)   Wt 163 lb 9.6 oz (74.2 kg)   SpO2 98%   BMI 27.22 kg/m   Gen: No acute distress, resting comfortably CV: Regular rate and rhythm with no murmurs appreciated Pulm: Normal work of breathing, clear to auscultation bilaterally with no crackles, wheezes, or rhonchi MSk: 1+ pedal edema and tibial edema to mid shin.  Neurovascular intact distally. Neuro: Grossly normal, moves all extremities Psych: Normal affect and thought content      Shatiqua Heroux M. Jimmey Ralph, MD 12/21/2022 10:51 AM

## 2022-12-21 NOTE — Assessment & Plan Note (Signed)
Slightly worsened recently.  Potentially due to the recent season changes.  We did discuss restarting inhaler however he declined for now.  Having more cough and congestion at night.  We will start Singulair.  He has done well with this previously.

## 2022-12-30 ENCOUNTER — Ambulatory Visit (INDEPENDENT_AMBULATORY_CARE_PROVIDER_SITE_OTHER): Payer: Medicare HMO | Admitting: Family Medicine

## 2022-12-30 ENCOUNTER — Encounter: Payer: Self-pay | Admitting: Family Medicine

## 2022-12-30 VITALS — BP 127/77 | HR 61 | Temp 97.9°F | Resp 16 | Ht 65.0 in | Wt 162.0 lb

## 2022-12-30 DIAGNOSIS — J449 Chronic obstructive pulmonary disease, unspecified: Secondary | ICD-10-CM | POA: Diagnosis not present

## 2022-12-30 DIAGNOSIS — R600 Localized edema: Secondary | ICD-10-CM

## 2022-12-30 DIAGNOSIS — I714 Abdominal aortic aneurysm, without rupture, unspecified: Secondary | ICD-10-CM

## 2022-12-30 DIAGNOSIS — I10 Essential (primary) hypertension: Secondary | ICD-10-CM

## 2022-12-30 DIAGNOSIS — R6889 Other general symptoms and signs: Secondary | ICD-10-CM | POA: Diagnosis not present

## 2022-12-30 MED ORDER — CHLORTHALIDONE 15 MG PO TABS
15.0000 mg | ORAL_TABLET | Freq: Every day | ORAL | 3 refills | Status: DC
Start: 2022-12-30 — End: 2023-01-20

## 2022-12-30 MED ORDER — TRELEGY ELLIPTA 100-62.5-25 MCG/ACT IN AEPB: 1.0000 | INHALATION_SPRAY | Freq: Every day | RESPIRATORY_TRACT | 11 refills | Status: DC

## 2022-12-30 NOTE — Progress Notes (Signed)
   [name] is a 87 y.o. male who presents today for an office visit.  Assessment/Plan:  Chronic Problems Addressed Today: HTN (hypertension) Blood pressure at goal today since stopping his amlodipine.  He did have some initial elevations at home but this has improved the last few days.  He is on Benicar 40 mg daily.  We will stay off of his amlodipine.  He is still having a bit of lower extremity edema and would be reasonable to add on small diuretic at this point to help with lower extremity edema and also have his blood pressure under a bit stricter control given his history of AAA.  We will start chlorthalidone 15 mg daily.  We discussed potential side effects.  They will come back in a couple of weeks for blood pressure recheck.  Will recheck labs at that time.  Chronic obstructive pulmonary disease (HCC) Has not had much improvement with Singulair.  He is agreeable to restart inhalers.  He has been prescribed on previously but does not remember the name of it.  Will start Trelegy Ellipta.  Hopefully this will help some with his cough as well.  If this continues to be an issue will refer to pulmonology.  AAA (abdominal aortic aneurysm) (HCC) Has previously seen vascular surgery.  They do not want to pursue any surgery or aggressive intervention at this point though it is very important for [place] to maintain strict blood pressure control.  Will be adding on chlorthalidone 15 mg daily in addition to his Benicar 40 mg daily.  They will continue to monitor blood pressure at home and follow-up with me in a few weeks as above.  Leg edema Improved somewhat since stopping amlodipine though still does have mild symptoms.  They will continue with conservative management including leg elevation and salt avoidance.  Will be adding on chlorthalidone as above which hopefully should help.  They will follow-up with me in a few weeks.     Subjective:  HPI:  See A/P for status of chronic  conditions.  Patient is here today for follow-up.  We saw him a week ago for leg edema.  There was concern that this was due to a side effect of his amlodipine and we asked him to stop this.  He has done well off amlodipine.  Has had some improvement in swelling in his legs though still has some still left over.  Home blood pressure readings were initially elevated in the 130s to 140s though over the last few days have come down into the 120s and 110s over 70s to 80s.  We also started him on Singulair at our last visit.  He has been on this for a few days.  He is not sure if this is made much of a difference.       Objective:  Physical Exam: BP 127/77   Pulse 61   Temp 97.9 F (36.6 C) (Temporal)   Resp 16   Ht  (1.651 m)   Wt 162 lb (73.5 kg)   SpO2 98%   BMI 26.96 kg/m   Gen: No acute distress, resting comfortably CV: Regular rate and rhythm with no murmurs appreciated Pulm: Normal work of breathing, clear to auscultation bilaterally with no crackles, wheezes, or rhonchi MSK: 1+ pretibial edema bilaterally. Neuro: Grossly normal, moves all extremities Psych: Normal affect and thought content      Bill Mcvey M. Jimmey Ralph, MD 12/30/2022 10:21 AM

## 2022-12-30 NOTE — Assessment & Plan Note (Signed)
Has previously seen vascular surgery.  They do not want to pursue any surgery or aggressive intervention at this point though it is very important for [place] to maintain strict blood pressure control.  Will be adding on chlorthalidone 15 mg daily in addition to his Benicar 40 mg daily.  They will continue to monitor blood pressure at home and follow-up with me in a few weeks as above.

## 2022-12-30 NOTE — Assessment & Plan Note (Signed)
Improved somewhat since stopping amlodipine though still does have mild symptoms.  They will continue with conservative management including leg elevation and salt avoidance.  Will be adding on chlorthalidone as above which hopefully should help.  They will follow-up with me in a few weeks.

## 2022-12-30 NOTE — Assessment & Plan Note (Signed)
Has not had much improvement with Singulair.  He is agreeable to restart inhalers.  He has been prescribed on previously but does not remember the name of it.  Will start Trelegy Ellipta.  Hopefully this will help some with his cough as well.  If this continues to be an issue will refer to pulmonology.

## 2022-12-30 NOTE — Assessment & Plan Note (Signed)
Blood pressure at goal today since stopping his amlodipine.  He did have some initial elevations at home but this has improved the last few days.  He is on Benicar 40 mg daily.  We will stay off of his amlodipine.  He is still having a bit of lower extremity edema and would be reasonable to add on small diuretic at this point to help with lower extremity edema and also have his blood pressure under a bit stricter control given his history of AAA.  We will start chlorthalidone 15 mg daily.  We discussed potential side effects.  They will come back in a couple of weeks for blood pressure recheck.  Will recheck labs at that time.

## 2022-12-30 NOTE — Patient Instructions (Addendum)
It was very nice to see you today!  Please stay off of the amlodipine.  Please start the chlorthalidone 50 mg daily.  Keep an eye on your blood pressure and please keep a record of this.  We will also start a new inhaler for your COPD and cough.  Return in about 3 weeks (around 01/20/2023) for Follow Up.   Take care, Dr Jimmey Ralph  PLEASE NOTE:  If you had any lab tests, please let us know if you have not heard back within a few days. You may see your results on mychart before we have a chance to review them but we will give you a call once they are reviewed by [place].   If we ordered any referrals today, please let us know if you have not heard from their office within the next week.   If you had any urgent prescriptions sent in today, please check with the pharmacy within an hour of our visit to make sure the prescription was transmitted appropriately.   Please try these tips to maintain a healthy lifestyle:  Eat at least 3 REAL meals and 1-2 snacks per day.  Aim for no more than 5 hours between eating.  If you eat breakfast, please do so within one hour of getting up.   Each meal should contain half fruits/vegetables, one quarter protein, and one quarter carbs (no bigger than a computer mouse)  Cut down on sweet beverages. This includes juice, soda, and sweet tea.   Drink at least 1 glass of water with each meal and aim for at least 8 glasses per day  Exercise at least 150 minutes every week.

## 2023-01-16 ENCOUNTER — Other Ambulatory Visit: Payer: Self-pay | Admitting: Family Medicine

## 2023-01-20 ENCOUNTER — Ambulatory Visit (INDEPENDENT_AMBULATORY_CARE_PROVIDER_SITE_OTHER): Payer: Medicare HMO | Admitting: Family Medicine

## 2023-01-20 ENCOUNTER — Encounter: Payer: Self-pay | Admitting: Family Medicine

## 2023-01-20 VITALS — BP 117/70 | HR 74 | Temp 97.8°F | Ht 65.0 in | Wt 163.6 lb

## 2023-01-20 DIAGNOSIS — J309 Allergic rhinitis, unspecified: Secondary | ICD-10-CM | POA: Diagnosis not present

## 2023-01-20 DIAGNOSIS — I10 Essential (primary) hypertension: Secondary | ICD-10-CM

## 2023-01-20 DIAGNOSIS — E782 Mixed hyperlipidemia: Secondary | ICD-10-CM

## 2023-01-20 DIAGNOSIS — R600 Localized edema: Secondary | ICD-10-CM

## 2023-01-20 DIAGNOSIS — R6889 Other general symptoms and signs: Secondary | ICD-10-CM | POA: Diagnosis not present

## 2023-01-20 MED ORDER — ROSUVASTATIN CALCIUM 40 MG PO TABS: 40.0000 mg | ORAL_TABLET | Freq: Every day | ORAL | 3 refills | Status: DC

## 2023-01-20 MED ORDER — AMLODIPINE BESYLATE 5 MG PO TABS: ORAL_TABLET | ORAL | 3 refills | Status: DC

## 2023-01-20 MED ORDER — AZELASTINE HCL 0.1 % NA SOLN: 2.0000 | Freq: Two times a day (BID) | NASAL | 12 refills | Status: DC

## 2023-01-20 MED ORDER — FUROSEMIDE 20 MG PO TABS: 20.0000 mg | ORAL_TABLET | Freq: Every day | ORAL | 3 refills | Status: DC

## 2023-01-20 NOTE — Assessment & Plan Note (Signed)
Blood pressure is at goal today.  Home readings have been at goal.  There was a little confusion with his medications.  They do have medications in the office today and we reviewed that.  He is currently on amlodipine 5 mg daily, olmesartan 40 mg daily and Lasix 20 mg daily.  He never received the chlorthalidone.  Will take this off of his medication list and he will continue with the current regimen as above.  The lower extremity edema has improved significantly since our last visit.  They will continue to monitor blood pressure at home and let us know if he has any persistent elevations.  Due to his history of AAA would be reasonable to have strict blood pressure control at this point.

## 2023-01-20 NOTE — Assessment & Plan Note (Signed)
Doing much better today.  Discussed importance of salt elevation and leg elevation.  Will continue his current regimen olmesartan 40 mg daily, amlodipine 5 mg daily, and Lasix 20 mg daily.

## 2023-01-20 NOTE — Progress Notes (Signed)
   [name] is a 87 y.o. male who presents today for an office visit.  Assessment/Plan:  Chronic Problems Addressed Today: HTN (hypertension) Blood pressure is at goal today.  Home readings have been at goal.  There was a little confusion with his medications.  They do have medications in the office today and we reviewed that.  He is currently on amlodipine 5 mg daily, olmesartan 40 mg daily and Lasix 20 mg daily.  He never received the chlorthalidone.  Will take this off of his medication list and he will continue with the current regimen as above.  The lower extremity edema has improved significantly since our last visit.  They will continue to monitor blood pressure at home and let us know if he has any persistent elevations.  Due to his history of AAA would be reasonable to have strict blood pressure control at this point.  Hyperlipidemia He had stopped taking the Lipitor.  Advised him to go back on this.  Will send in refill today.  Leg edema Doing much better today.  Discussed importance of salt elevation and leg elevation.  Will continue his current regimen olmesartan 40 mg daily, amlodipine 5 mg daily, and Lasix 20 mg daily.  Allergic rhinitis Worsened recently.  He is on Singulair but is not sure if this is helping much.  Will start Astelin.  He will let us know if not improving.     Subjective:  HPI:  See A/p for status of chronic conditions.  Patient is here today for follow-up.  We last saw him a few weeks ago.  At his last office visit we added on chlorthalidone 15 mg daily to help with lower extremity edema as well as blood pressure management.  Apparently he never received this prescription and he continue with his previous regimen.  He is currently on olmesartan 40 mg daily, amlodipine 5 mg daily, and Lasix 20 mg daily. His home readings have been in the 110s-120s/70s at home.  Swelling has been much better.  Has been having some more issues with cough and  congestion.       Objective:  Physical Exam: BP 117/70   Pulse 74   Temp 97.8 F (36.6 C) (Temporal)   Ht 5\' 5"  (1.651 m)   Wt 163 lb 9.6 oz (74.2 kg)   SpO2 95%   BMI 27.22 kg/m   Gen: No acute distress, resting comfortably CV: Regular rate and rhythm with no murmurs appreciated Pulm: Normal work of breathing, clear to auscultation bilaterally with no crackles, wheezes, or rhonchi MUSCULOSKELETAL: -Bilateral legs with trace pretibial edema. Neuro: Grossly normal, moves all extremities Psych: Normal affect and thought content      Cyndra Feinberg M. Jimmey Ralph, MD 01/20/2023 8:55 AM

## 2023-01-20 NOTE — Assessment & Plan Note (Signed)
Worsened recently.  He is on Singulair but is not sure if this is helping much.  Will start Astelin.  He will let us know if not improving.

## 2023-01-20 NOTE — Assessment & Plan Note (Signed)
He had stopped taking the Lipitor.  Advised him to go back on this.  Will send in refill today.

## 2023-01-20 NOTE — Patient Instructions (Addendum)
It was very nice to see you today!  I am glad that you are doing well.   Please try the nasal spray.  No other medication changes today.  Return in about 6 months (around 07/23/2023).   Take care, Dr Jimmey Ralph  PLEASE NOTE:  If you had any lab tests, please let us know if you have not heard back within a few days. You may see your results on mychart before we have a chance to review them but we will give you a call once they are reviewed by [place].   If we ordered any referrals today, please let us know if you have not heard from their office within the next week.   If you had any urgent prescriptions sent in today, please check with the pharmacy within an hour of our visit to make sure the prescription was transmitted appropriately.   Please try these tips to maintain a healthy lifestyle:  Eat at least 3 REAL meals and 1-2 snacks per day.  Aim for no more than 5 hours between eating.  If you eat breakfast, please do so within one hour of getting up.   Each meal should contain half fruits/vegetables, one quarter protein, and one quarter carbs (no bigger than a computer mouse)  Cut down on sweet beverages. This includes juice, soda, and sweet tea.   Drink at least 1 glass of water with each meal and aim for at least 8 glasses per day  Exercise at least 150 minutes every week.

## 2023-01-24 ENCOUNTER — Encounter: Payer: Self-pay | Admitting: Family Medicine

## 2023-01-25 NOTE — Telephone Encounter (Signed)
See note

## 2023-01-25 NOTE — Telephone Encounter (Signed)
I appreciate the update. I believe we should have clarified all of his medications at his last visit per my note. I did ask them to go back on the cholesterol medications and continue with the amlodipine and olmesartan.   If there is still confusion then recommend they schedule an appointment and bring in all medications.  Katina Degree. Jimmey Ralph, MD 01/25/2023 1:04 PM

## 2023-02-13 ENCOUNTER — Other Ambulatory Visit: Payer: Self-pay

## 2023-02-13 ENCOUNTER — Encounter (HOSPITAL_COMMUNITY): Payer: Self-pay

## 2023-02-13 ENCOUNTER — Inpatient Hospital Stay (HOSPITAL_COMMUNITY)
Admission: EM | Admit: 2023-02-13 | Discharge: 2023-02-22 | DRG: 871 | Disposition: A | Discharge status: Home Health | Payer: Medicare HMO | Attending: Internal Medicine | Admitting: Internal Medicine

## 2023-02-13 ENCOUNTER — Emergency Department (HOSPITAL_COMMUNITY): Payer: Medicare HMO

## 2023-02-13 DIAGNOSIS — A327 Listerial sepsis: Principal | ICD-10-CM | POA: Diagnosis present

## 2023-02-13 DIAGNOSIS — Z1152 Encounter for screening for COVID-19: Secondary | ICD-10-CM | POA: Diagnosis not present

## 2023-02-13 DIAGNOSIS — Z66 Do not resuscitate: Secondary | ICD-10-CM | POA: Diagnosis not present

## 2023-02-13 DIAGNOSIS — R7881 Bacteremia: Secondary | ICD-10-CM | POA: Diagnosis not present

## 2023-02-13 DIAGNOSIS — N400 Enlarged prostate without lower urinary tract symptoms: Secondary | ICD-10-CM | POA: Diagnosis present

## 2023-02-13 DIAGNOSIS — N179 Acute kidney failure, unspecified: Secondary | ICD-10-CM

## 2023-02-13 DIAGNOSIS — I723 Aneurysm of iliac artery: Secondary | ICD-10-CM | POA: Diagnosis present

## 2023-02-13 DIAGNOSIS — D649 Anemia, unspecified: Secondary | ICD-10-CM | POA: Diagnosis present

## 2023-02-13 DIAGNOSIS — R8271 Bacteriuria: Secondary | ICD-10-CM | POA: Diagnosis present

## 2023-02-13 DIAGNOSIS — R17 Unspecified jaundice: Secondary | ICD-10-CM | POA: Diagnosis present

## 2023-02-13 DIAGNOSIS — A329 Listeriosis, unspecified: Secondary | ICD-10-CM

## 2023-02-13 DIAGNOSIS — D696 Thrombocytopenia, unspecified: Secondary | ICD-10-CM | POA: Diagnosis not present

## 2023-02-13 DIAGNOSIS — I129 Hypertensive chronic kidney disease with stage 1 through stage 4 chronic kidney disease, or unspecified chronic kidney disease: Secondary | ICD-10-CM | POA: Diagnosis present

## 2023-02-13 DIAGNOSIS — M48061 Spinal stenosis, lumbar region without neurogenic claudication: Secondary | ICD-10-CM | POA: Diagnosis present

## 2023-02-13 DIAGNOSIS — R918 Other nonspecific abnormal finding of lung field: Secondary | ICD-10-CM | POA: Diagnosis not present

## 2023-02-13 DIAGNOSIS — I716 Thoracoabdominal aortic aneurysm, without rupture, unspecified: Secondary | ICD-10-CM | POA: Diagnosis present

## 2023-02-13 DIAGNOSIS — E785 Hyperlipidemia, unspecified: Secondary | ICD-10-CM | POA: Diagnosis present

## 2023-02-13 DIAGNOSIS — R5381 Other malaise: Secondary | ICD-10-CM | POA: Diagnosis present

## 2023-02-13 DIAGNOSIS — J9601 Acute respiratory failure with hypoxia: Secondary | ICD-10-CM

## 2023-02-13 DIAGNOSIS — E871 Hypo-osmolality and hyponatremia: Secondary | ICD-10-CM | POA: Diagnosis not present

## 2023-02-13 DIAGNOSIS — I714 Abdominal aortic aneurysm, without rupture, unspecified: Secondary | ICD-10-CM | POA: Diagnosis present

## 2023-02-13 DIAGNOSIS — E8770 Fluid overload, unspecified: Secondary | ICD-10-CM | POA: Diagnosis not present

## 2023-02-13 DIAGNOSIS — I701 Atherosclerosis of renal artery: Secondary | ICD-10-CM | POA: Diagnosis not present

## 2023-02-13 DIAGNOSIS — J449 Chronic obstructive pulmonary disease, unspecified: Secondary | ICD-10-CM | POA: Diagnosis present

## 2023-02-13 DIAGNOSIS — D631 Anemia in chronic kidney disease: Secondary | ICD-10-CM | POA: Diagnosis present

## 2023-02-13 DIAGNOSIS — M5126 Other intervertebral disc displacement, lumbar region: Secondary | ICD-10-CM | POA: Diagnosis not present

## 2023-02-13 DIAGNOSIS — R6520 Severe sepsis without septic shock: Secondary | ICD-10-CM | POA: Diagnosis present

## 2023-02-13 DIAGNOSIS — J189 Pneumonia, unspecified organism: Secondary | ICD-10-CM | POA: Diagnosis not present

## 2023-02-13 DIAGNOSIS — R609 Edema, unspecified: Secondary | ICD-10-CM | POA: Diagnosis not present

## 2023-02-13 DIAGNOSIS — Z7189 Other specified counseling: Secondary | ICD-10-CM | POA: Diagnosis not present

## 2023-02-13 DIAGNOSIS — Z7982 Long term (current) use of aspirin: Secondary | ICD-10-CM

## 2023-02-13 DIAGNOSIS — Z8249 Family history of ischemic heart disease and other diseases of the circulatory system: Secondary | ICD-10-CM

## 2023-02-13 DIAGNOSIS — K567 Ileus, unspecified: Secondary | ICD-10-CM | POA: Diagnosis not present

## 2023-02-13 DIAGNOSIS — E559 Vitamin D deficiency, unspecified: Secondary | ICD-10-CM | POA: Diagnosis present

## 2023-02-13 DIAGNOSIS — J9811 Atelectasis: Secondary | ICD-10-CM | POA: Diagnosis not present

## 2023-02-13 DIAGNOSIS — I728 Aneurysm of other specified arteries: Secondary | ICD-10-CM | POA: Diagnosis not present

## 2023-02-13 DIAGNOSIS — Z888 Allergy status to other drugs, medicaments and biological substances status: Secondary | ICD-10-CM

## 2023-02-13 DIAGNOSIS — M4314 Spondylolisthesis, thoracic region: Secondary | ICD-10-CM | POA: Diagnosis not present

## 2023-02-13 DIAGNOSIS — E538 Deficiency of other specified B group vitamins: Secondary | ICD-10-CM | POA: Diagnosis present

## 2023-02-13 DIAGNOSIS — J440 Chronic obstructive pulmonary disease with acute lower respiratory infection: Secondary | ICD-10-CM | POA: Diagnosis present

## 2023-02-13 DIAGNOSIS — R109 Unspecified abdominal pain: Secondary | ICD-10-CM | POA: Diagnosis not present

## 2023-02-13 DIAGNOSIS — J410 Simple chronic bronchitis: Secondary | ICD-10-CM | POA: Diagnosis not present

## 2023-02-13 DIAGNOSIS — Z515 Encounter for palliative care: Secondary | ICD-10-CM | POA: Diagnosis not present

## 2023-02-13 DIAGNOSIS — K6389 Other specified diseases of intestine: Secondary | ICD-10-CM | POA: Diagnosis not present

## 2023-02-13 DIAGNOSIS — M4316 Spondylolisthesis, lumbar region: Secondary | ICD-10-CM | POA: Diagnosis present

## 2023-02-13 DIAGNOSIS — Z8673 Personal history of transient ischemic attack (TIA), and cerebral infarction without residual deficits: Secondary | ICD-10-CM

## 2023-02-13 DIAGNOSIS — E1122 Type 2 diabetes mellitus with diabetic chronic kidney disease: Secondary | ICD-10-CM | POA: Diagnosis present

## 2023-02-13 DIAGNOSIS — A419 Sepsis, unspecified organism: Secondary | ICD-10-CM | POA: Diagnosis not present

## 2023-02-13 DIAGNOSIS — N1831 Chronic kidney disease, stage 3a: Secondary | ICD-10-CM | POA: Diagnosis present

## 2023-02-13 DIAGNOSIS — Z79899 Other long term (current) drug therapy: Secondary | ICD-10-CM

## 2023-02-13 DIAGNOSIS — R0600 Dyspnea, unspecified: Secondary | ICD-10-CM | POA: Diagnosis not present

## 2023-02-13 DIAGNOSIS — I2699 Other pulmonary embolism without acute cor pulmonale: Secondary | ICD-10-CM | POA: Diagnosis not present

## 2023-02-13 DIAGNOSIS — Z87891 Personal history of nicotine dependence: Secondary | ICD-10-CM

## 2023-02-13 DIAGNOSIS — M47816 Spondylosis without myelopathy or radiculopathy, lumbar region: Secondary | ICD-10-CM | POA: Diagnosis present

## 2023-02-13 DIAGNOSIS — R7303 Prediabetes: Secondary | ICD-10-CM | POA: Diagnosis present

## 2023-02-13 DIAGNOSIS — I10 Essential (primary) hypertension: Secondary | ICD-10-CM | POA: Diagnosis present

## 2023-02-13 DIAGNOSIS — R509 Fever, unspecified: Secondary | ICD-10-CM | POA: Diagnosis present

## 2023-02-13 DIAGNOSIS — J439 Emphysema, unspecified: Secondary | ICD-10-CM | POA: Diagnosis not present

## 2023-02-13 LAB — COMPREHENSIVE METABOLIC PANEL
ALT: 14 U/L (ref 0–44)
AST: 19 U/L (ref 15–41)
Albumin: 3.7 g/dL (ref 3.5–5.0)
Alkaline Phosphatase: 58 U/L (ref 38–126)
Anion gap: 10 (ref 5–15)
BUN: 27 mg/dL — ABNORMAL HIGH (ref 8–23)
CO2: 21 mmol/L — ABNORMAL LOW (ref 22–32)
Calcium: 8.8 mg/dL — ABNORMAL LOW (ref 8.9–10.3)
Chloride: 104 mmol/L (ref 98–111)
Creatinine, Ser: 1.72 mg/dL — ABNORMAL HIGH (ref 0.61–1.24)
GFR, Estimated: 39 mL/min — ABNORMAL LOW (ref >60)
Glucose, Bld: 143 mg/dL — ABNORMAL HIGH (ref 70–99)
Potassium: 3.9 mmol/L (ref 3.5–5.1)
Sodium: 135 mmol/L (ref 135–145)
Total Bilirubin: 1.6 mg/dL — ABNORMAL HIGH (ref 0.3–1.2)
Total Protein: 7.3 g/dL (ref 6.5–8.1)

## 2023-02-13 LAB — URINALYSIS, W/ REFLEX TO CULTURE (INFECTION SUSPECTED)
Bilirubin Urine: NEGATIVE
Glucose, UA: NEGATIVE mg/dL
Ketones, ur: NEGATIVE mg/dL
Leukocytes,Ua: NEGATIVE
Nitrite: NEGATIVE
Protein, ur: 100 mg/dL — AB
Specific Gravity, Urine: 1.012 (ref 1.005–1.030)
pH: 5 (ref 5.0–8.0)

## 2023-02-13 LAB — CBC WITH DIFFERENTIAL/PLATELET
Abs Immature Granulocytes: 0.2 10*3/uL — ABNORMAL HIGH (ref 0.00–0.07)
Basophils Absolute: 0 10*3/uL (ref 0.0–0.1)
Basophils Relative: 0 %
Eosinophils Absolute: 0 10*3/uL (ref 0.0–0.5)
Eosinophils Relative: 0 %
HCT: 33.1 % — ABNORMAL LOW (ref 39.0–52.0)
Hemoglobin: 10.9 g/dL — ABNORMAL LOW (ref 13.0–17.0)
Immature Granulocytes: 1 %
Lymphocytes Relative: 1 %
Lymphs Abs: 0.2 10*3/uL — ABNORMAL LOW (ref 0.7–4.0)
MCH: 30.5 pg (ref 26.0–34.0)
MCHC: 32.9 g/dL (ref 30.0–36.0)
MCV: 92.7 fL (ref 80.0–100.0)
Monocytes Absolute: 1 10*3/uL (ref 0.1–1.0)
Monocytes Relative: 5 %
Neutro Abs: 17.2 10*3/uL — ABNORMAL HIGH (ref 1.7–7.7)
Neutrophils Relative %: 93 %
Platelets: 115 10*3/uL — ABNORMAL LOW (ref 150–400)
RBC: 3.57 MIL/uL — ABNORMAL LOW (ref 4.22–5.81)
RDW: 14.6 % (ref 11.5–15.5)
WBC: 18.6 10*3/uL — ABNORMAL HIGH (ref 4.0–10.5)
nRBC: 0 % (ref 0.0–0.2)

## 2023-02-13 LAB — PROTIME-INR
INR: 1.4 — ABNORMAL HIGH (ref 0.8–1.2)
Prothrombin Time: 17 seconds — ABNORMAL HIGH (ref 11.4–15.2)

## 2023-02-13 LAB — RESP PANEL BY RT-PCR (RSV, FLU A&B, COVID)  RVPGX2
Influenza A by PCR: NEGATIVE
Influenza B by PCR: NEGATIVE
Resp Syncytial Virus by PCR: NEGATIVE
SARS Coronavirus 2 by RT PCR: NEGATIVE

## 2023-02-13 LAB — LACTIC ACID, PLASMA
Lactic Acid, Venous: 1.1 mmol/L (ref 0.5–1.9)
Lactic Acid, Venous: 1.6 mmol/L (ref 0.5–1.9)

## 2023-02-13 LAB — GLUCOSE, CAPILLARY
Glucose-Capillary: 111 mg/dL — ABNORMAL HIGH (ref 70–99)
Glucose-Capillary: 184 mg/dL — ABNORMAL HIGH (ref 70–99)

## 2023-02-13 LAB — APTT: aPTT: 31 seconds (ref 24–36)

## 2023-02-13 LAB — STREP PNEUMONIAE URINARY ANTIGEN: Strep Pneumo Urinary Antigen: NEGATIVE

## 2023-02-13 MED ORDER — ONDANSETRON HCL 4 MG/2ML IJ SOLN
4.0000 mg | Freq: Four times a day (QID) | INTRAMUSCULAR | Status: DC | PRN
  Administered 2023-02-17 – 2023-02-19 (×2): 4 mg via INTRAVENOUS
  Filled 2023-02-13 (×2): qty 2

## 2023-02-13 MED ORDER — AMLODIPINE BESYLATE 10 MG PO TABS
5.0000 mg | ORAL_TABLET | Freq: Two times a day (BID) | ORAL | Status: DC
  Administered 2023-02-13 – 2023-02-18 (×9): 5 mg via ORAL
  Filled 2023-02-13 (×10): qty 1

## 2023-02-13 MED ORDER — ONDANSETRON HCL 4 MG PO TABS
4.0000 mg | ORAL_TABLET | Freq: Four times a day (QID) | ORAL | Status: DC | PRN
  Administered 2023-02-17: 4 mg via ORAL
  Filled 2023-02-13: qty 1

## 2023-02-13 MED ORDER — SODIUM CHLORIDE 0.9 % IV SOLN
2.0000 g | INTRAVENOUS | Status: DC
  Administered 2023-02-13 – 2023-02-14 (×2): 2 g via INTRAVENOUS
  Filled 2023-02-13 (×2): qty 20

## 2023-02-13 MED ORDER — LIDOCAINE 5 % EX PTCH
1.0000 | MEDICATED_PATCH | CUTANEOUS | Status: AC
  Administered 2023-02-13: 1 via TRANSDERMAL
  Filled 2023-02-13: qty 1

## 2023-02-13 MED ORDER — ACETAMINOPHEN 650 MG RE SUPP: 650.0000 mg | Freq: Four times a day (QID) | RECTAL | Status: DC | PRN

## 2023-02-13 MED ORDER — ROSUVASTATIN CALCIUM 20 MG PO TABS
40.0000 mg | ORAL_TABLET | Freq: Every day | ORAL | Status: DC
  Administered 2023-02-14 – 2023-02-22 (×9): 40 mg via ORAL
  Filled 2023-02-13 (×9): qty 2

## 2023-02-13 MED ORDER — INSULIN ASPART 100 UNIT/ML IJ SOLN
0.0000 [IU] | Freq: Three times a day (TID) | INTRAMUSCULAR | Status: DC
  Administered 2023-02-14 – 2023-02-15 (×4): 1 [IU] via SUBCUTANEOUS
  Administered 2023-02-15: 2 [IU] via SUBCUTANEOUS
  Administered 2023-02-16: 1 [IU] via SUBCUTANEOUS
  Administered 2023-02-16: 2 [IU] via SUBCUTANEOUS
  Administered 2023-02-17 – 2023-02-18 (×4): 1 [IU] via SUBCUTANEOUS
  Administered 2023-02-18 – 2023-02-20 (×4): 2 [IU] via SUBCUTANEOUS
  Administered 2023-02-20 – 2023-02-21 (×2): 1 [IU] via SUBCUTANEOUS
  Administered 2023-02-21: 5 [IU] via SUBCUTANEOUS
  Administered 2023-02-22: 2 [IU] via SUBCUTANEOUS

## 2023-02-13 MED ORDER — ORAL CARE MOUTH RINSE: 15.0000 mL | OROMUCOSAL | Status: DC | PRN

## 2023-02-13 MED ORDER — ACETAMINOPHEN 325 MG PO TABS
650.0000 mg | ORAL_TABLET | Freq: Four times a day (QID) | ORAL | Status: DC | PRN
  Administered 2023-02-13 – 2023-02-22 (×12): 650 mg via ORAL
  Filled 2023-02-13 (×16): qty 2

## 2023-02-13 MED ORDER — SODIUM CHLORIDE 0.9 % IV SOLN
500.0000 mg | INTRAVENOUS | Status: DC
  Administered 2023-02-13 – 2023-02-14 (×2): 500 mg via INTRAVENOUS
  Filled 2023-02-13 (×2): qty 5

## 2023-02-13 MED ORDER — LACTATED RINGERS IV BOLUS
1000.0000 mL | Freq: Once | INTRAVENOUS | Status: AC
  Administered 2023-02-13: 1000 mL via INTRAVENOUS

## 2023-02-13 MED ORDER — MELATONIN 5 MG PO TABS
5.0000 mg | ORAL_TABLET | Freq: Once | ORAL | Status: AC
  Administered 2023-02-13: 5 mg via ORAL
  Filled 2023-02-13: qty 1

## 2023-02-13 MED ORDER — ENOXAPARIN SODIUM 30 MG/0.3ML IJ SOSY: 30.0000 mg | PREFILLED_SYRINGE | INTRAMUSCULAR | Status: DC

## 2023-02-13 NOTE — Plan of Care (Signed)
  Problem: Activity: Goal: Ability to tolerate increased activity will improve Outcome: Progressing   Problem: Education: Goal: Knowledge of General Education information will improve Description: Including pain rating scale, medication(s)/side effects and non-pharmacologic comfort measures Outcome: Progressing   Problem: Activity: Goal: Risk for activity intolerance will decrease Outcome: Progressing   Problem: Coping: Goal: Level of anxiety will decrease Outcome: Progressing   Problem: Elimination: Goal: Will not experience complications related to urinary retention Outcome: Progressing   Problem: Pain Managment: Goal: General experience of comfort will improve Outcome: Progressing   Problem: Safety: Goal: Ability to remain free from injury will improve Outcome: Progressing   Problem: Skin Integrity: Goal: Risk for impaired skin integrity will decrease Outcome: Progressing   

## 2023-02-13 NOTE — ED Triage Notes (Signed)
Pt arrived via POV, c/o fevers at home (up to 104) and generalized weakness. Denies any CP or SOB. Took tylenol PTA

## 2023-02-13 NOTE — Sepsis Progress Note (Signed)
Elink following code sepsis °

## 2023-02-13 NOTE — H&P (Signed)
History and Physical    Patient: [name] ZOX:[id number] DOB: 12-03-1935 DOA: 02/13/2023 DOS: the patient was seen and examined on 02/13/2023 PCP: Ardith Dark, MD  Patient coming from: Home  Chief Complaint:  Chief Complaint  Patient presents with   Fever   HPI: [name] is a 87 y.o. male with medical history significant of BPH, COPD, history of nonhemorrhagic CVA, type 2 diabetes, hyperlipidemia, hypertension, stage IIIa CKD, normocytic anemia, thrombocytopenia, osteoarthritis, high risk surgical candidate with the descending thoracic arctic aneurysm, vitamin B12 deficiency, vitamin D deficiency who presented to the emergency department with fever since last night associated with chills, generalized weakness, fatigue, malaise and occasionally productive cough.  No URI symptoms or sick contacts. He denied rhinorrhea, sore throat, wheezing or hemoptysis.  No chest pain, palpitations, diaphoresis, PND, orthopnea or recent pitting edema of the lower extremities.  No abdominal pain, nausea, emesis, diarrhea, constipation, melena or hematochezia.  No flank pain, dysuria or hematuria.  No polyuria, polydipsia, polyphagia or blurred vision.    ED course: Initial vital signs were temperature 98.3 F, pulse 111, respirations 16, BP 112/74 mmHg O2 sat 89% on room air.  Patient received supplemental oxygen at 2 LPM and via nasal cannula, LR 1000 mL bolus, ceftriaxone 2 g IVPB and azithromycin 500 mg IVPB.  Lab work: His urinalysis showed moderate hemoglobin, 100 mg/dL of protein and rare bacteria microscopic examination. CBC showed a white count of 18.6 with 93% neutrophils, hemoglobin 10.9 g/dL platelets 811.  PT was 17, INR 1.4 and PTT 31.  Lactic acid was normal.  CMP showed a CO2 of 21 mmol/L with a normal anion gap, the rest of the electrolytes were normal after calcium correction.  Glucose 143, BUN 27, creatinine 1.72 and total bilirubin 1.6 mg/dL.  Imaging: One-view portable chest  radiograph showed stable contour a very large descending thoracic aneurysm.  No new cardiopulmonary abnormality identified.   Review of Systems: As mentioned in the history of present illness. All other systems reviewed and are negative.  Past Medical History:  Diagnosis Date   BPH (benign prostatic hyperplasia)    COPD (chronic obstructive pulmonary disease) (HCC)    CVA (cerebral vascular accident) (HCC)    Diabetes mellitus without complication (HCC)    Dizziness and giddiness 07/09/2015   Hyperlipidemia    Hypertension    Renal disorder    Past Surgical History:  Procedure Laterality Date   APPENDECTOMY     TEE WITHOUT CARDIOVERSION  03/10/2012   Procedure: TRANSESOPHAGEAL ECHOCARDIOGRAM (TEE);  Surgeon: Ricki Rodriguez, MD;  Location: [facility] ENDOSCOPY;  Service: Cardiovascular;  Laterality: N/A;   Social History:  reports that he has quit smoking. He has never used smokeless tobacco. He reports current alcohol use. He reports that he does not use drugs.  Allergies  Allergen Reactions   Ace Inhibitors     cough    Family History  Problem Relation Age of Onset   Heart attack Father    Cancer Mother    Cancer Sister        brain tumor   Heart attack Brother    Heart attack Brother    Heart disease Brother     Prior to Admission medications   Medication Sig Start Date End Date Taking? Authorizing Provider  amLODipine (NORVASC) 5 MG tablet TAKE 1 TABLET (5 MG TOTAL) BY MOUTH IN THE MORNING AND AT BEDTIME. 01/20/23   Ardith Dark, MD  aspirin EC 81 MG tablet Take 1 tablet (81  mg total) by mouth daily. 02/14/20   Orland Mustard, MD  azelastine (ASTELIN) 0.1 % nasal spray Place 2 sprays into both nostrils 2 (two) times daily. 01/20/23   Ardith Dark, MD  calcium-vitamin D (OSCAL WITH D) 500-5 MG-MCG tablet Take 1 tablet by mouth.    [provider]  cyanocobalamin (VITAMIN B12) 500 MCG tablet Take 500 mcg by mouth daily.    [provider]  furosemide (LASIX)  20 MG tablet Take 1 tablet (20 mg total) by mouth daily. 01/20/23   Ardith Dark, MD  Methylcobalamin (B-12) 5000 MCG TBDP Take by mouth.    [provider]  montelukast (SINGULAIR) 10 MG tablet Take 1 tablet (10 mg total) by mouth at bedtime. 12/21/22   Ardith Dark, MD  olmesartan (BENICAR) 40 MG tablet TAKE 1 TABLET EVERY DAY 01/17/23   Ardith Dark, MD  olopatadine (PATANOL) 0.1 % ophthalmic solution Place 1 drop into both eyes 2 (two) times daily.    [provider]  Omega-3 Fatty Acids (FISH OIL) 1000 MG CAPS Take 1 capsule by mouth daily.    [provider]  rosuvastatin (CRESTOR) 40 MG tablet Take 1 tablet (40 mg total) by mouth daily. 01/20/23   Ardith Dark, MD    Physical Exam: Vitals:   02/13/23 1153 02/13/23 1240 02/13/23 1245 02/13/23 1330  BP: 112/74   118/85  Pulse: (!) 111 (!) 101  96  Resp: 16 (!) 32  (!) 33  Temp: 98.3 F (36.8 C)     TempSrc: Oral     SpO2: (!) 89% (!) 89%  94%  Weight:   68 kg   Height:   5\' 5"  (1.651 m)    Physical Exam Vitals and nursing note reviewed.  Constitutional:      Appearance: Normal appearance.  HENT:     Head: Normocephalic.     Nose: Nose normal. No rhinorrhea.  Eyes:     General: No scleral icterus.    Pupils: Pupils are equal, round, and reactive to light.  Cardiovascular:     Rate and Rhythm: Normal rate and regular rhythm.  Pulmonary:     Effort: Pulmonary effort is normal.  Abdominal:     General: Bowel sounds are normal. There is no distension.     Palpations: Abdomen is soft.     Tenderness: There is no abdominal tenderness. There is no guarding.  Musculoskeletal:     Cervical back: Neck supple.     Right lower leg: No edema.     Left lower leg: No edema.  Skin:    General: Skin is warm and dry.  Neurological:     General: No focal deficit present.     Mental Status: He is alert and oriented to person, place, and time.  Psychiatric:        Mood and Affect: Mood normal.         Behavior: Behavior normal.     Data Reviewed:  There are no new results to review at this time.  Assessment and Plan: Principal Problem:   Acute respiratory failure with hypoxia (HCC) With high suspicion for pneumonia. Superimposed on:    Chronic obstructive pulmonary disease (HCC) Admit to PCU/inpatient. Continue supplemental oxygen. Scheduled and as needed bronchodilators. Continue ceftriaxone 1 g IVPB daily. Continue azithromycin 500 mg IVPB daily. Check strep pneumoniae urinary antigen. Check sputum Gram stain, culture and sensitivity. Follow-up blood culture and sensitivity. Follow-up CBC and chemistry in the morning.  Consider chest CT imaging if no improvement.  Active Problems:   HTN (hypertension) Continue amlodipine 5 mg p.o. daily. Continue olmesartan 40 mg p.o. daily. Monitor BP, renal function electrolytes.    Hyperlipidemia Continue rosuvastatin 40 mg p.o. daily.    Pre-diabetes HbA1c was 6.5% 4+ months ago. Carbohydrate modified diet. CBG monitoring with [facility] while in the hospital.    Stage 3a chronic kidney disease (HCC) Creatinine higher than baseline. Not meeting criteria for AKI. Hold olmesartan for now. Monitor renal function and electrolytes.    Normocytic anemia Monitor hematocrit and hemoglobin. Transfuse as needed.    Thrombocytopenia (HCC) Monitor platelet count.    AAA (abdominal aortic aneurysm) (HCC) No surgery planned due to very high operative risk.     Advance Care Planning:   Code Status: Agrees to intubation/mechanical vent if needed. ACLS meds okay, no CPR due to high risk of rupturing thoracic aortic aneurysm.  Consults:   Family Communication: 2 of his relatives are present in his room.  Severity of Illness: The appropriate patient status for this patient is INPATIENT. Inpatient status is judged to be reasonable and necessary in order to provide the required intensity of service to ensure the patient's safety. The  patient's presenting symptoms, physical exam findings, and initial radiographic and laboratory data in the context of their chronic comorbidities is felt to place them at high risk for further clinical deterioration. Furthermore, it is not anticipated that the patient will be medically stable for discharge from the hospital within 2 midnights of admission.   * I certify that at the point of admission it is my clinical judgment that the patient will require inpatient hospital care spanning beyond 2 midnights from the point of admission due to high intensity of service, high risk for further deterioration and high frequency of surveillance required.*  Author: Bobette Mo, MD 02/13/2023 2:04 PM  For on call review [email or web address].   This document was prepared using Dragon voice recognition software and may contain some unintended transcription errors.

## 2023-02-13 NOTE — ED Notes (Signed)
Patient has urine culture in the main lab °

## 2023-02-13 NOTE — ED Notes (Signed)
ED TO INPATIENT HANDOFF REPORT  ED Nurse Name and Phone #:  Mellody Dance  -  [phone]   S Name/Age/Gender [name] 87 y.o. male Room/Bed: WA20/WA20  Code Status   Code Status: Prior  Home/SNF/Other Home Patient oriented to: self, place, time, and situation Is this baseline? Yes   Triage Complete: Triage complete  Chief Complaint Acute respiratory failure with hypoxia (HCC) [J96.01]  Triage Note Pt arrived via POV, c/o fevers at home (up to 104) and generalized weakness. Denies any CP or SOB. Took tylenol PTA   Allergies Allergies  Allergen Reactions   Ace Inhibitors     cough    Level of Care/Admitting Diagnosis ED Disposition     ED Disposition  Admit   Condition  --   Comment  Hospital Area: [facility] [[id number]]  Level of Care: Progressive [102]  Admit to Progressive based on following criteria: RESPIRATORY PROBLEMS hypoxemic/hypercapnic respiratory failure that is responsive to NIPPV (BiPAP) or High Flow Nasal Cannula (6-80 lpm). Frequent assessment/intervention, no > Q2 hrs < Q4 hrs, to maintain oxygenation and pulmonary hygiene.  May admit patient to [facility] or [facility] if equivalent level of care is available:: No  Covid Evaluation: Asymptomatic - no recent exposure (last 10 days) testing not required  Diagnosis: Acute respiratory failure with hypoxia [facility]) [[id number]]  Admitting Physician: Bobette Mo [[id number]]  Attending Physician: Bobette Mo [[id number]]  Certification:: I certify this patient will need inpatient services for at least 2 midnights  Estimated Length of Stay: 2          B Medical/Surgery History Past Medical History:  Diagnosis Date   BPH (benign prostatic hyperplasia)    COPD (chronic obstructive pulmonary disease) (HCC)    CVA (cerebral vascular accident) (HCC)    Diabetes mellitus without complication (HCC)    Dizziness and giddiness 07/09/2015   Hyperlipidemia    Hypertension     Renal disorder    Past Surgical History:  Procedure Laterality Date   APPENDECTOMY     TEE WITHOUT CARDIOVERSION  03/10/2012   Procedure: TRANSESOPHAGEAL ECHOCARDIOGRAM (TEE);  Surgeon: Ricki Rodriguez, MD;  Location: [facility] ENDOSCOPY;  Service: Cardiovascular;  Laterality: N/A;     A IV Location/Drains/Wounds Patient Lines/Drains/Airways Status     Active Line/Drains/Airways     Name Placement date Placement time Site Days   Peripheral IV 02/13/23 20 G Left;Posterior Forearm 02/13/23  1250  Forearm  less than 1   Peripheral IV 02/13/23 22 G Posterior;Right Forearm 02/13/23  1348  Forearm  less than 1            Intake/Output Last 24 hours  Intake/Output Summary (Last 24 hours) at 02/13/2023 1526 Last data filed at 02/13/2023 1510 Gross per 24 hour  Intake 1350 ml  Output --  Net 1350 ml    Labs/Imaging Results for orders placed or performed during the hospital encounter of 02/13/23 (from the past 48 hour(s))  Resp panel by RT-PCR (RSV, Flu A&B, Covid) Anterior Nasal Swab     Status: None   Collection Time: 02/13/23 12:26 PM   Specimen: Anterior Nasal Swab  Result Value Ref Range   SARS Coronavirus 2 by RT PCR NEGATIVE NEGATIVE    Comment: (NOTE) SARS-CoV-2 target nucleic acids are NOT DETECTED.  The SARS-CoV-2 RNA is generally detectable in upper respiratory specimens during the acute phase of infection. The lowest concentration of SARS-CoV-2 viral copies this assay can detect is 138 copies/mL. A negative result does  not preclude SARS-Cov-2 infection and should not be used as the sole basis for treatment or other patient management decisions. A negative result may occur with  improper specimen collection/handling, submission of specimen other than nasopharyngeal swab, presence of viral mutation(s) within the areas targeted by this assay, and inadequate number of viral copies(<138 copies/mL). A negative result must be combined with clinical observations, patient history,  and epidemiological information. The expected result is Negative.  Fact Sheet for Patients:  BloggerCourse.com  Fact Sheet for Healthcare Providers:  SeriousBroker.it  This test is no t yet approved or cleared by the [place] FDA and  has been authorized for detection and/or diagnosis of SARS-CoV-2 by FDA under an Emergency Use Authorization (EUA). This EUA will remain  in effect (meaning this test can be used) for the duration of the COVID-19 declaration under Section 564(b)(1) of the Act, 21 U.S.C.section 360bbb-3(b)(1), unless the authorization is terminated  or revoked sooner.       Influenza A by PCR NEGATIVE NEGATIVE   Influenza B by PCR NEGATIVE NEGATIVE    Comment: (NOTE) The Xpert Xpress SARS-CoV-2/FLU/RSV plus assay is intended as an aid in the diagnosis of influenza from Nasopharyngeal swab specimens and should not be used as a sole basis for treatment. Nasal washings and aspirates are unacceptable for Xpert Xpress SARS-CoV-2/FLU/RSV testing.  Fact Sheet for Patients: BloggerCourse.com  Fact Sheet for Healthcare Providers: SeriousBroker.it  This test is not yet approved or cleared by the [place] FDA and has been authorized for detection and/or diagnosis of SARS-CoV-2 by FDA under an Emergency Use Authorization (EUA). This EUA will remain in effect (meaning this test can be used) for the duration of the COVID-19 declaration under Section 564(b)(1) of the Act, 21 U.S.C. section 360bbb-3(b)(1), unless the authorization is terminated or revoked.     Resp Syncytial Virus by PCR NEGATIVE NEGATIVE    Comment: (NOTE) Fact Sheet for Patients: BloggerCourse.com  Fact Sheet for Healthcare Providers: SeriousBroker.it  This test is not yet approved or cleared by the [place] FDA and has been authorized for  detection and/or diagnosis of SARS-CoV-2 by FDA under an Emergency Use Authorization (EUA). This EUA will remain in effect (meaning this test can be used) for the duration of the COVID-19 declaration under Section 564(b)(1) of the Act, 21 U.S.C. section 360bbb-3(b)(1), unless the authorization is terminated or revoked.  Performed at [facility], [address] [place], [place] [address]   Lactic acid, plasma     Status: None   Collection Time: 02/13/23 12:49 PM  Result Value Ref Range   Lactic Acid, Venous 1.1 0.5 - 1.9 mmol/L    Comment: Performed at [facility], [address], [place], [place] [address]  Comprehensive metabolic panel     Status: Abnormal   Collection Time: 02/13/23 12:49 PM  Result Value Ref Range   Sodium 135 135 - 145 mmol/L   Potassium 3.9 3.5 - 5.1 mmol/L   Chloride 104 98 - 111 mmol/L   CO2 21 (L) 22 - 32 mmol/L   Glucose, Bld 143 (H) 70 - 99 mg/dL    Comment: Glucose reference range applies only to samples taken after fasting for at least 8 hours.   BUN 27 (H) 8 - 23 mg/dL   Creatinine, Ser 2.84 (H) 0.61 - 1.24 mg/dL   Calcium 8.8 (L) 8.9 - 10.3 mg/dL   Total Protein 7.3 6.5 - 8.1 g/dL   Albumin 3.7 3.5 - 5.0 g/dL  AST 19 15 - 41 U/L   ALT 14 0 - 44 U/L   Alkaline Phosphatase 58 38 - 126 U/L   Total Bilirubin 1.6 (H) 0.3 - 1.2 mg/dL   GFR, Estimated 39 (L) >60 mL/min    Comment: (NOTE) Calculated using the CKD-EPI Creatinine Equation (2021)    Anion gap 10 5 - 15    Comment: Performed at [facility], [address] [place], [place] [address]  CBC with Differential     Status: Abnormal   Collection Time: 02/13/23 12:49 PM  Result Value Ref Range   WBC 18.6 (H) 4.0 - 10.5 K/uL   RBC 3.57 (L) 4.22 - 5.81 MIL/uL   Hemoglobin 10.9 (L) 13.0 - 17.0 g/dL   HCT 60.4 (L) 54.0 - 98.1 %   MCV 92.7 80.0 - 100.0 fL   MCH 30.5 26.0 - 34.0 pg   MCHC 32.9 30.0 - 36.0 g/dL   RDW 19.1 47.8 - 29.5 %    Platelets 115 (L) 150 - 400 K/uL   nRBC 0.0 0.0 - 0.2 %   Neutrophils Relative % 93 %   Neutro Abs 17.2 (H) 1.7 - 7.7 K/uL   Lymphocytes Relative 1 %   Lymphs Abs 0.2 (L) 0.7 - 4.0 K/uL   Monocytes Relative 5 %   Monocytes Absolute 1.0 0.1 - 1.0 K/uL   Eosinophils Relative 0 %   Eosinophils Absolute 0.0 0.0 - 0.5 K/uL   Basophils Relative 0 %   Basophils Absolute 0.0 0.0 - 0.1 K/uL   Immature Granulocytes 1 %   Abs Immature Granulocytes 0.20 (H) 0.00 - 0.07 K/uL    Comment: Performed at [facility], [address], [place], [place] [address]  Protime-INR     Status: Abnormal   Collection Time: 02/13/23 12:49 PM  Result Value Ref Range   Prothrombin Time 17.0 (H) 11.4 - 15.2 seconds   INR 1.4 (H) 0.8 - 1.2    Comment: (NOTE) INR goal varies based on device and disease states. Performed at [facility], [address] [place], [place] [address]   APTT     Status: None   Collection Time: 02/13/23 12:49 PM  Result Value Ref Range   aPTT 31 24 - 36 seconds    Comment: Performed at [facility], [address] [place], [place] [address]  Urinalysis, w/ Reflex to Culture (Infection Suspected) -Urine, Clean Catch     Status: Abnormal   Collection Time: 02/13/23  2:43 PM  Result Value Ref Range   Specimen Source URINE, CLEAN CATCH    Color, Urine YELLOW YELLOW   APPearance CLEAR CLEAR   Specific Gravity, Urine 1.012 1.005 - 1.030   pH 5.0 5.0 - 8.0   Glucose, UA NEGATIVE NEGATIVE mg/dL   Hgb urine dipstick MODERATE (A) NEGATIVE   Bilirubin Urine NEGATIVE NEGATIVE   Ketones, ur NEGATIVE NEGATIVE mg/dL   Protein, ur 952 (A) NEGATIVE mg/dL   Nitrite NEGATIVE NEGATIVE   Leukocytes,Ua NEGATIVE NEGATIVE   RBC / HPF 6-10 0 - 5 RBC/hpf   WBC, UA 0-5 0 - 5 WBC/hpf    Comment:        Reflex urine culture not performed if WBC <=10, OR if Squamous epithelial cells >5. If Squamous epithelial cells >5 suggest recollection.     Bacteria, UA RARE (A) NONE SEEN   Squamous Epithelial / HPF 0-5 0 - 5 /HPF   Mucus PRESENT  Comment: Performed at [facility], [address] [place], [place] [address]   DG Chest Port 1 View  Result Date: 02/13/2023 CLINICAL DATA:  87 year old male with possible sepsis. EXAM: PORTABLE CHEST 1 VIEW COMPARISON:  CTA chest 12/29/2021 and earlier. FINDINGS: Portable AP semi upright view at 1227 hours. Rounded masslike contour abnormality along the left mediastinum was demonstrated to be large Large descending Thoracic Aortic Aneurysm on CTA in April. Stable cardiac size and mediastinal contours. Continued low lung volumes. No superimposed pneumothorax, pulmonary edema, definite pleural effusion, or consolidation. No acute osseous abnormality identified. Negative visible bowel gas. IMPRESSION: 1. Stable radiographic contour of very large Descending Thoracic Aortic Aneurysm demonstrated by CTA in April (approximately 6.2 cm at that time). 2. No new cardiopulmonary abnormality identified. Electronically Signed   By: Odessa Fleming M.D.   On: 02/13/2023 12:43    Pending Labs Unresulted Labs (From admission, onward)     Start     Ordered   02/13/23 1215  Lactic acid, plasma  (Undifferentiated presentation (screening labs and basic nursing orders))  STAT Now then every 2 hours,   R (with STAT occurrences)      02/13/23 1215   02/13/23 1215  Blood Culture (routine x 2)  (Undifferentiated presentation (screening labs and basic nursing orders))  BLOOD CULTURE X 2,   STAT      02/13/23 1215            Vitals/Pain Today's Vitals   02/13/23 1240 02/13/23 1245 02/13/23 1330 02/13/23 1502  BP:   118/85 (!) 103/58  Pulse: (!) 101  96 92  Resp: (!) 32  (!) 33 (!) 27  Temp:      TempSrc:      SpO2: (!) 89%  94% 97%  Weight:  68 kg    Height:  5\' 5"  (1.651 m)      Isolation Precautions No active isolations  Medications Medications  cefTRIAXone (ROCEPHIN) 2 g in sodium chloride  0.9 % 100 mL IVPB (0 g Intravenous Stopped 02/13/23 1430)  azithromycin (ZITHROMAX) 500 mg in sodium chloride 0.9 % 250 mL IVPB (0 mg Intravenous Stopped 02/13/23 1510)  lactated ringers bolus 1,000 mL (0 mLs Intravenous Stopped 02/13/23 1503)    Mobility walks     Focused Assessments    R Recommendations: See Admitting Provider Note  Report given to:   Additional Notes:

## 2023-02-13 NOTE — ED Notes (Signed)
Pt 02 sat would drop to 89% while sleeping. I put him on a nasal cannula at 2lpm. O2 sat increased to 94%

## 2023-02-13 NOTE — ED Provider Notes (Signed)
[name] Provider Note   CSN: [id number] Arrival date & time: 02/13/23  1139     History  Chief Complaint  Patient presents with   Fever    [name] is a 87 y.o. male.  HPI 87 year old male presents with weakness.  History is from the son and patient.  Started having weakness and some cough last night.  Had some chills.  Today had a temp of 104 and then later 103.  Got 2 extra strength Tylenol at home.  He does not feel short of breath though he does have the new cough.  Denies any specific urinary symptoms.  Feels all over weak and tired but denies any focal weakness.  He has a history of thoracicoabdominal aneurysms but denies any chest, abdominal, back pain.  Home Medications Prior to Admission medications   Medication Sig Start Date End Date Taking? Authorizing Provider  amLODipine (NORVASC) 5 MG tablet TAKE 1 TABLET (5 MG TOTAL) BY MOUTH IN THE MORNING AND AT BEDTIME. Patient taking differently: Take 5 mg by mouth 2 (two) times daily. TAKE 1 TABLET (5 MG TOTAL) BY MOUTH IN THE MORNING AND AT BEDTIME. 01/20/23   Ardith Dark, MD  aspirin EC 81 MG tablet Take 1 tablet (81 mg total) by mouth daily. 02/14/20   Orland Mustard, MD  azelastine (ASTELIN) 0.1 % nasal spray Place 2 sprays into both nostrils 2 (two) times daily. 01/20/23   Ardith Dark, MD  calcium-vitamin D (OSCAL WITH D) 500-5 MG-MCG tablet Take 1 tablet by mouth.    [provider]  cyanocobalamin (VITAMIN B12) 500 MCG tablet Take 500 mcg by mouth daily.    [provider]  furosemide (LASIX) 20 MG tablet Take 1 tablet (20 mg total) by mouth daily. 01/20/23   Ardith Dark, MD  Methylcobalamin (B-12) 5000 MCG TBDP Take by mouth.    [provider]  montelukast (SINGULAIR) 10 MG tablet Take 1 tablet (10 mg total) by mouth at bedtime. 12/21/22   Ardith Dark, MD  olmesartan (BENICAR) 40 MG tablet TAKE 1 TABLET EVERY DAY 01/17/23   Ardith Dark, MD  olopatadine (PATANOL) 0.1 % ophthalmic solution Place 1 drop into both eyes 2 (two) times daily.    [provider]  Omega-3 Fatty Acids (FISH OIL) 1000 MG CAPS Take 1 capsule by mouth daily.    [provider]  rosuvastatin (CRESTOR) 40 MG tablet Take 1 tablet (40 mg total) by mouth daily. 01/20/23   Ardith Dark, MD      Allergies    Ace inhibitors    Review of Systems   Review of Systems  Constitutional:  Positive for chills, fatigue and fever.  Respiratory:  Positive for cough. Negative for shortness of breath.   Cardiovascular:  Negative for chest pain.  Gastrointestinal:  Negative for abdominal pain.  Musculoskeletal:  Negative for back pain.  Neurological:  Positive for weakness.    Physical Exam Updated Vital Signs BP (!) 103/58   Pulse 92   Temp 98.3 F (36.8 C) (Oral)   Resp (!) 27   Ht 5\' 5"  (1.651 m)   Wt 68 kg   SpO2 97%   BMI 24.96 kg/m  Physical Exam Vitals and nursing note reviewed.  Constitutional:      Appearance: He is well-developed.  HENT:     Head: Normocephalic and atraumatic.  Cardiovascular:     Rate and Rhythm: Regular rhythm.  Tachycardia present.     Heart sounds: Normal heart sounds.  Pulmonary:     Effort: Pulmonary effort is normal. Tachypnea present. No accessory muscle usage.     Breath sounds: Normal breath sounds.  Abdominal:     Palpations: Abdomen is soft.     Tenderness: There is no abdominal tenderness.  Skin:    General: Skin is warm and dry.  Neurological:     Mental Status: He is alert.     Comments: 5/5 strength in all 4 extremities.     ED Results / Procedures / Treatments   Labs (all labs ordered are listed, but only abnormal results are displayed) Labs Reviewed  COMPREHENSIVE METABOLIC PANEL - Abnormal; Notable for the following components:      Result Value   CO2 21 (*)    Glucose, Bld 143 (*)    BUN 27 (*)    Creatinine, Ser 1.72 (*)    Calcium 8.8 (*)    Total Bilirubin 1.6  (*)    GFR, Estimated 39 (*)    All other components within normal limits  CBC WITH DIFFERENTIAL/PLATELET - Abnormal; Notable for the following components:   WBC 18.6 (*)    RBC 3.57 (*)    Hemoglobin 10.9 (*)    HCT 33.1 (*)    Platelets 115 (*)    Neutro Abs 17.2 (*)    Lymphs Abs 0.2 (*)    Abs Immature Granulocytes 0.20 (*)    All other components within normal limits  PROTIME-INR - Abnormal; Notable for the following components:   Prothrombin Time 17.0 (*)    INR 1.4 (*)    All other components within normal limits  URINALYSIS, W/ REFLEX TO CULTURE (INFECTION SUSPECTED) - Abnormal; Notable for the following components:   Hgb urine dipstick MODERATE (*)    Protein, ur 100 (*)    Bacteria, UA RARE (*)    All other components within normal limits  RESP PANEL BY RT-PCR (RSV, FLU A&B, COVID)  [id number]  CULTURE, BLOOD (ROUTINE X 2)  CULTURE, BLOOD (ROUTINE X 2)  LACTIC ACID, PLASMA  APTT  LACTIC ACID, PLASMA    EKG None  Radiology DG Chest Port 1 View  Result Date: 02/13/2023 CLINICAL DATA:  87 year old male with possible sepsis. EXAM: PORTABLE CHEST 1 VIEW COMPARISON:  CTA chest 12/29/2021 and earlier. FINDINGS: Portable AP semi upright view at 1227 hours. Rounded masslike contour abnormality along the left mediastinum was demonstrated to be large Large descending Thoracic Aortic Aneurysm on CTA in April. Stable cardiac size and mediastinal contours. Continued low lung volumes. No superimposed pneumothorax, pulmonary edema, definite pleural effusion, or consolidation. No acute osseous abnormality identified. Negative visible bowel gas. IMPRESSION: 1. Stable radiographic contour of very large Descending Thoracic Aortic Aneurysm demonstrated by CTA in April (approximately 6.2 cm at that time). 2. No new cardiopulmonary abnormality identified. Electronically Signed   By: Odessa Fleming M.D.   On: 02/13/2023 12:43    Procedures Procedures    Medications Ordered in ED Medications   cefTRIAXone (ROCEPHIN) 2 g in sodium chloride 0.9 % 100 mL IVPB (0 g Intravenous Stopped 02/13/23 1430)  azithromycin (ZITHROMAX) 500 mg in sodium chloride 0.9 % 250 mL IVPB (0 mg Intravenous Stopped 02/13/23 1510)  lactated ringers bolus 1,000 mL (0 mLs Intravenous Stopped 02/13/23 1503)    ED Course/ Medical Decision Making/ A&P  Medical Decision Making Amount and/or Complexity of Data Reviewed Independent Historian: spouse    Details: And son Labs: ordered.    Details: Leukocytosis, AKI.  Radiology: ordered.    Details: No lobar pneumonia ECG/medicine tests: ordered and independent interpretation performed.    Details: Tachycardia  Risk Decision regarding hospitalization.   Patient presents with generalized weakness.  Symptoms seemingly started last night that when the wife arrived she also indicated that the patient has been coughing for about 3 days.  I suspect he has pneumonia this not showing up on the chest x-ray given he has some mild respiratory failure with O2 sat around 89% requiring 2 L of oxygen.  He does not have any chest pain, abdominal pain, or back pain and so I highly doubt that he has anything related to his aneurysm at this point.  He was started on oxygen, fluids, and IV antibiotics.  Has SIRS criteria but no obvious sepsis..  Will admit.  Discussed with Dr. Robb Matar.        Final Clinical Impression(s) / ED Diagnoses Final diagnoses:  Acute respiratory failure with hypoxia (HCC)  Acute kidney injury [facility])    Rx / DC Orders ED Discharge Orders     None         Pricilla Loveless, MD 02/13/23 [phone]

## 2023-02-14 ENCOUNTER — Inpatient Hospital Stay (HOSPITAL_COMMUNITY): Payer: Medicare HMO

## 2023-02-14 DIAGNOSIS — J9601 Acute respiratory failure with hypoxia: Secondary | ICD-10-CM | POA: Diagnosis not present

## 2023-02-14 LAB — CBC
HCT: 32.3 % — ABNORMAL LOW (ref 39.0–52.0)
Hemoglobin: 10.7 g/dL — ABNORMAL LOW (ref 13.0–17.0)
MCH: 30.7 pg (ref 26.0–34.0)
MCHC: 33.1 g/dL (ref 30.0–36.0)
MCV: 92.8 fL (ref 80.0–100.0)
Platelets: 98 10*3/uL — ABNORMAL LOW (ref 150–400)
RBC: 3.48 MIL/uL — ABNORMAL LOW (ref 4.22–5.81)
RDW: 14.6 % (ref 11.5–15.5)
WBC: 20.7 10*3/uL — ABNORMAL HIGH (ref 4.0–10.5)
nRBC: 0 % (ref 0.0–0.2)

## 2023-02-14 LAB — COMPREHENSIVE METABOLIC PANEL
ALT: 15 U/L (ref 0–44)
AST: 23 U/L (ref 15–41)
Albumin: 3.5 g/dL (ref 3.5–5.0)
Alkaline Phosphatase: 51 U/L (ref 38–126)
Anion gap: 9 (ref 5–15)
BUN: 24 mg/dL — ABNORMAL HIGH (ref 8–23)
CO2: 22 mmol/L (ref 22–32)
Calcium: 8.4 mg/dL — ABNORMAL LOW (ref 8.9–10.3)
Chloride: 99 mmol/L (ref 98–111)
Creatinine, Ser: 1.51 mg/dL — ABNORMAL HIGH (ref 0.61–1.24)
GFR, Estimated: 45 mL/min — ABNORMAL LOW (ref >60)
Glucose, Bld: 147 mg/dL — ABNORMAL HIGH (ref 70–99)
Potassium: 3.8 mmol/L (ref 3.5–5.1)
Sodium: 130 mmol/L — ABNORMAL LOW (ref 135–145)
Total Bilirubin: 1.3 mg/dL — ABNORMAL HIGH (ref 0.3–1.2)
Total Protein: 7 g/dL (ref 6.5–8.1)

## 2023-02-14 LAB — GLUCOSE, CAPILLARY
Glucose-Capillary: 143 mg/dL — ABNORMAL HIGH (ref 70–99)
Glucose-Capillary: 150 mg/dL — ABNORMAL HIGH (ref 70–99)
Glucose-Capillary: 133 mg/dL — ABNORMAL HIGH (ref 70–99)

## 2023-02-14 LAB — CULTURE, BLOOD (ROUTINE X 2)

## 2023-02-14 MED ORDER — TRAMADOL HCL 50 MG PO TABS
25.0000 mg | ORAL_TABLET | Freq: Four times a day (QID) | ORAL | Status: DC | PRN
  Administered 2023-02-14: 50 mg via ORAL
  Filled 2023-02-14: qty 1

## 2023-02-14 MED ORDER — TRAMADOL HCL 50 MG PO TABS
25.0000 mg | ORAL_TABLET | Freq: Once | ORAL | Status: AC
  Administered 2023-02-14: 25 mg via ORAL
  Filled 2023-02-14: qty 1

## 2023-02-14 MED ORDER — GADOBUTROL 1 MMOL/ML IV SOLN
7.0000 mL | Freq: Once | INTRAVENOUS | Status: AC | PRN
  Administered 2023-02-14: 7 mL via INTRAVENOUS

## 2023-02-14 MED ORDER — OXYCODONE HCL 5 MG PO TABS
5.0000 mg | ORAL_TABLET | ORAL | Status: DC | PRN
  Administered 2023-02-14 – 2023-02-21 (×22): 5 mg via ORAL
  Filled 2023-02-14 (×23): qty 1

## 2023-02-14 MED ORDER — METHOCARBAMOL 500 MG PO TABS
500.0000 mg | ORAL_TABLET | Freq: Four times a day (QID) | ORAL | Status: DC | PRN
  Administered 2023-02-14 – 2023-02-22 (×15): 500 mg via ORAL
  Filled 2023-02-14 (×16): qty 1

## 2023-02-14 MED ORDER — LACTATED RINGERS IV SOLN: INTRAVENOUS | Status: DC

## 2023-02-14 NOTE — Progress Notes (Signed)
[facility] PROGRESS NOTE  [name] (DOB: 12/23/1935) WGN:[id number] PCP: Ardith Dark, MD  Brief Narrative: [name] is an 87 y.o. male with a history of CVA, T2DM, HTN, HLD, stage IIIa CKD, BPH, anemia, thrombocytopenia, OA, and AAA managed medically due to prohibitive surgical risk who presented to the ED on 02/13/2023 with fever and chills, fatigue. He's had no recent changes in symptoms, but has had nonproductive cough for many weeks. Initially afebrile and tachycardic with modest hypoxemia requiring 2L O2. Rare bacteriuria noted on UA. WBC 18.6k. Despite reassuring CXR, given the patient's fever, leukocytosis, hypoxia, and cough, antibiotics to treat for pneumonia were given and patient was admitted.   Subjective: Febrile to 101F last night, has had increasing, constant low-mid back pain starting just before he arrived to the hospital. It's worsening. Usually takes tramadol for pains at home, didn't help.    Objective: BP 105/71 (BP Location: Left Arm)   Pulse 99   Temp 98.2 F (36.8 C) (Oral)   Resp 18   Ht 5\' 5"  (1.651 m)   Wt 69.4 kg   SpO2 97%   BMI 25.46 kg/m   Gen: Elderly male laying on left side in pain in back Pulm: Clear, nonlabored  CV: RRR, borderline tachycardia. heart sounds transmitted through midline abdomen, trace pedal edema GI: Soft, NT, ND, +BS, +pulsation. Neuro: Alert and oriented. No new focal deficits. MSK: Right paraspinal spasm without midline tenderness or stepoffs Skin: No rashes, lesions or ulcers on visualized skin   Assessment & Plan: Principal Problem:   Acute respiratory failure with hypoxia (HCC) Active Problems:   HTN (hypertension)   Hyperlipidemia   Normocytic anemia   Pre-diabetes   Chronic obstructive pulmonary disease (HCC)   Stage 3a chronic kidney disease (HCC)   Thrombocytopenia (HCC)   AAA (abdominal aortic aneurysm) (HCC)  Leukocytosis worsening, fever curve rising. Still no definite source with  negative CXR. Blood cultures 1 of 4 positive for GPR (anaerobic) of unclear significance. He has no diarrhea, abdominal pain, or vomiting. His back pain is new and severe without radiculopathy.  - MR T and L spine to r/o epidural abscess, etc. given unclear nature of infection.  - Continue ceftriaxone, azithromycin. If becomes hemodynamically unstable or blood cultures grow more convincing organisms, would need to broaden coverage.   Acute hypoxic respiratory failure: due to possible pneumonia on COPD.  - Wean oxygen as tolerated as we treat possible pneumonia.   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 Has followed up with [facility] vascular surgery, last available note is July 2023 at which point the recommended follow up only if he wished for surgery.  - ?if this may contribute to back pain if no other etiology found.  AKI on stage IIIa CKD: Improving Cr 1.7 > 1.5 with baseline 1.2-1.4.  - Support with IVF, monitor daily.   NO CPR: Pt would wish for ACLS medications/AED and ventilatory support in the event of cardiac/respiratory arrest, but would not want CPR.   Hyponatremia: Monitor  Hyperbilirubinemia: Improving, monitoring.   Normocytic anemia, thrombcytopenia:  - Monitor  HLD:  - Continue rosuvastatin  Prediabetes:  - SSI  Tyrone Nine, MD [facility] [email or web address] 02/14/2023, 1:23 PM

## 2023-02-14 NOTE — Plan of Care (Signed)
  Problem: Activity: Goal: Ability to tolerate increased activity will improve Outcome: Progressing   Problem: Clinical Measurements: Goal: Ability to maintain a body temperature in the normal range will improve Outcome: Progressing   Problem: Respiratory: Goal: Ability to maintain a clear airway will improve Outcome: Progressing   Problem: Nutritional: Goal: Maintenance of adequate nutrition will improve Outcome: Progressing

## 2023-02-14 NOTE — Progress Notes (Signed)
PHARMACY - PHYSICIAN COMMUNICATION CRITICAL VALUE ALERT - BLOOD CULTURE IDENTIFICATION (BCID)  [name] is an 87 y.o. male who presented to [facility] on [facility]/13/2023 with a chief complaint of fever, weakness, cough, chills. Sats 89%  Assessment:  Treating for PNA. Lab called with blood cultures 1/2 GP Rods with no BCID species identification.  Name of physician (or Provider) ContactedJarvis Newcomer and ID Pharmacist  Current antibiotics: Azithro/Rocephin  Changes to prescribed antibiotics recommended:  None.  No results found for this or any previous visit.   [name] S. [name], PharmD, BCPS Clinical Staff Pharmacist [email or web address] [name] 02/14/2023  12:07 PM

## 2023-02-14 NOTE — Plan of Care (Signed)
  Problem: Safety: Goal: Ability to remain free from injury will improve Outcome: Progressing   Problem: Pain Managment: Goal: General experience of comfort will improve Outcome: Progressing   Problem: Coping: Goal: Level of anxiety will decrease Outcome: Progressing   

## 2023-02-15 ENCOUNTER — Inpatient Hospital Stay (HOSPITAL_COMMUNITY): Payer: Medicare HMO

## 2023-02-15 DIAGNOSIS — R7881 Bacteremia: Secondary | ICD-10-CM | POA: Diagnosis not present

## 2023-02-15 DIAGNOSIS — A329 Listeriosis, unspecified: Secondary | ICD-10-CM

## 2023-02-15 DIAGNOSIS — J9601 Acute respiratory failure with hypoxia: Secondary | ICD-10-CM | POA: Diagnosis not present

## 2023-02-15 LAB — ECHOCARDIOGRAM COMPLETE
Area-P 1/2: 3.1 cm2
Calc EF: 51 %
Height: 65 in
P 1/2 time: 307 msec
S' Lateral: 3.9 cm
Single Plane A2C EF: 50.9 %
Single Plane A4C EF: 51.2 %
Weight: 2447.99 oz

## 2023-02-15 LAB — CBC
HCT: 28.3 % — ABNORMAL LOW (ref 39.0–52.0)
Hemoglobin: 9.1 g/dL — ABNORMAL LOW (ref 13.0–17.0)
MCH: 29.9 pg (ref 26.0–34.0)
MCHC: 32.2 g/dL (ref 30.0–36.0)
MCV: 93.1 fL (ref 80.0–100.0)
Platelets: 85 10*3/uL — ABNORMAL LOW (ref 150–400)
RBC: 3.04 MIL/uL — ABNORMAL LOW (ref 4.22–5.81)
RDW: 14.9 % (ref 11.5–15.5)
WBC: 14.8 10*3/uL — ABNORMAL HIGH (ref 4.0–10.5)
nRBC: 0 % (ref 0.0–0.2)

## 2023-02-15 LAB — GLUCOSE, CAPILLARY
Glucose-Capillary: 100 mg/dL — ABNORMAL HIGH (ref 70–99)
Glucose-Capillary: 160 mg/dL — ABNORMAL HIGH (ref 70–99)
Glucose-Capillary: 137 mg/dL — ABNORMAL HIGH (ref 70–99)
Glucose-Capillary: 147 mg/dL — ABNORMAL HIGH (ref 70–99)

## 2023-02-15 LAB — BASIC METABOLIC PANEL
Anion gap: 8 (ref 5–15)
BUN: 27 mg/dL — ABNORMAL HIGH (ref 8–23)
CO2: 23 mmol/L (ref 22–32)
Calcium: 7.9 mg/dL — ABNORMAL LOW (ref 8.9–10.3)
Chloride: 101 mmol/L (ref 98–111)
Creatinine, Ser: 1.58 mg/dL — ABNORMAL HIGH (ref 0.61–1.24)
GFR, Estimated: 43 mL/min — ABNORMAL LOW (ref >60)
Glucose, Bld: 165 mg/dL — ABNORMAL HIGH (ref 70–99)
Potassium: 4 mmol/L (ref 3.5–5.1)
Sodium: 132 mmol/L — ABNORMAL LOW (ref 135–145)

## 2023-02-15 LAB — CULTURE, BLOOD (ROUTINE X 2)

## 2023-02-15 MED ORDER — VANCOMYCIN HCL 1500 MG/300ML IV SOLN
1500.0000 mg | INTRAVENOUS | Status: DC
  Filled 2023-02-15: qty 300

## 2023-02-15 MED ORDER — HEPARIN SODIUM (PORCINE) 5000 UNIT/ML IJ SOLN
5000.0000 [IU] | Freq: Three times a day (TID) | INTRAMUSCULAR | Status: DC
  Administered 2023-02-15 – 2023-02-22 (×22): 5000 [IU] via SUBCUTANEOUS
  Filled 2023-02-15 (×22): qty 1

## 2023-02-15 MED ORDER — SODIUM CHLORIDE 0.9 % IV SOLN
2.0000 g | Freq: Four times a day (QID) | INTRAVENOUS | Status: DC
  Administered 2023-02-15 – 2023-02-22 (×29): 2 g via INTRAVENOUS
  Filled 2023-02-15 (×29): qty 2000

## 2023-02-15 MED ORDER — METRONIDAZOLE 500 MG/100ML IV SOLN: 500.0000 mg | Freq: Two times a day (BID) | INTRAVENOUS | Status: DC

## 2023-02-15 MED ORDER — VANCOMYCIN HCL 1250 MG/250ML IV SOLN
1250.0000 mg | Freq: Once | INTRAVENOUS | Status: DC
  Filled 2023-02-15: qty 250

## 2023-02-15 MED ORDER — SODIUM CHLORIDE 0.9 % IV SOLN: 3.0000 g | Freq: Four times a day (QID) | INTRAVENOUS | Status: DC

## 2023-02-15 MED ORDER — GENTAMICIN IN SALINE 1-0.9 MG/ML-% IV SOLN
100.0000 mg | INTRAVENOUS | Status: AC
  Administered 2023-02-15 – 2023-02-17 (×3): 100 mg via INTRAVENOUS
  Filled 2023-02-15 (×3): qty 100

## 2023-02-15 NOTE — Consult Note (Signed)
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 It is possible, but would be unusual for listeria to cause bone/joint infection. I do not suspect involvement. He is a smoker and sometimes referred pain from AAA can be an issue although this organism is not associated with aneurysm infection like that such as chlamydia. However, will do an ultrasound abd  Tte negative and will see if persistent bacteremia or clinical change indicate repeat w/u for IE, which at this time appear to be low risk  No sign of meningoencephalitis at this time    Plan: Abx changed to amp/gent. Given ckd/aki will plan 3 day of gentamicin We are considering 3 weeks of ampicillin treatment F/u 6/4 repeat blood cx  Will check hiv tomorrow as well Abd ultrasound look for abd aortic aneurysm Will need to monitor the back pain; I suspect referred pain that I am not cleared where from Discussed with primary team and family      ------------------------------------------------ Principal Problem:   Acute respiratory failure with hypoxia (HCC) Active Problems:   HTN (hypertension)   Hyperlipidemia   Normocytic anemia   Pre-diabetes   Chronic obstructive pulmonary disease (HCC)   Stage 3a chronic kidney disease (HCC)   Thrombocytopenia (HCC)   AAA (abdominal aortic aneurysm)  (HCC)    HPI: [name] is a 87 y.o. 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 The day prior to admission developed fever/chill malaisea/weakness and acute new lower back pain  No neurological deficit, n/v/abd pain, cough, chest pain, rash, other joint pain, headache, confusion  On presentation had fever and leukocytosis Bcx returned with gpr growing.  Didn't respond to empiric tx of presumed pna (mild cough for a couple weeks)  Today on day 2 of bcx report maldi is showing listeria  Abx switched to amp/gent today   Other initial abnormal lab includes mild azotemia from ckd  Thoracolumbar mri no acute abnormality   Patient lives with wife and daughter in law. They ate/prepare food separate. He often consumes left over/reheated food. Lots of pita/roti bread (middle [place] origin) and cheese, fresh fruits/veg, ham, poultry. No eating out recently. His son bought some [place] [employer] sub as well within the past 2 weeks but no one else in family is sick     Family History  Problem Relation Age of Onset   Heart attack Father    Cancer Mother    Cancer Sister        brain tumor   Heart attack Brother  Heart attack Brother    Heart disease Brother     Social History   Tobacco Use   Smoking status: Former   Smokeless tobacco: Never  Substance Use Topics   Alcohol use: Yes    Alcohol/week: 0.0 standard drinks of alcohol    Comment: occasionally   Drug use: No    Allergies  Allergen Reactions   Ace Inhibitors     cough    Review of Systems: ROS All Other ROS was negative, except mentioned above   Past Medical History:  Diagnosis Date   BPH (benign  prostatic hyperplasia)    COPD (chronic obstructive pulmonary disease) (HCC)    CVA (cerebral vascular accident) (HCC)    Diabetes mellitus without complication (HCC)    Dizziness and giddiness 07/09/2015   Hyperlipidemia    Hypertension    Renal disorder        Scheduled Meds:  amLODipine  5 mg Oral BID   insulin aspart  0-9 Units Subcutaneous TID WC   rosuvastatin  40 mg Oral Daily   Continuous Infusions:  ampicillin (OMNIPEN) IV 2 g (02/15/23 0909)   gentamicin     lactated ringers 75 mL/hr at 02/15/23 0225   PRN Meds:.acetaminophen **OR** acetaminophen, methocarbamol, ondansetron **OR** ondansetron (ZOFRAN) IV, mouth rinse, oxyCODONE   OBJECTIVE: Blood pressure 101/85, pulse 98, temperature 99.3 F (37.4 C), temperature source Oral, resp. rate 18, height 5\' 5"  (1.651 m), weight 69.4 kg, SpO2 97 %.  Physical Exam  General/constitutional: no distress, pleasant, appears mildly lethargic; conversant HEENT: Normocephalic, PER, Conj Clear, EOMI, Oropharynx clear Neck supple CV: rrr no mrg Lungs: clear to auscultation, normal respiratory effort Abd: Soft, Nontender Ext: no edema Skin: No Rash Neuro: nonfocal; no confusion, slow of mentation (family reports baseline mentation) MSK: no peripheral joint swelling/tenderness/warmth; back spines nontender   Lab Results Lab Results  Component Value Date   WBC 14.8 (H) 02/15/2023   HGB 9.1 (L) 02/15/2023   HCT 28.3 (L) 02/15/2023   MCV 93.1 02/15/2023   PLT 85 (L) 02/15/2023    Lab Results  Component Value Date   CREATININE 1.58 (H) 02/15/2023   BUN 27 (H) 02/15/2023   NA 132 (L) 02/15/2023   K 4.0 02/15/2023   CL 101 02/15/2023   CO2 23 02/15/2023    Lab Results  Component Value Date   ALT 15 02/14/2023   AST 23 02/14/2023   ALKPHOS 51 02/14/2023   BILITOT 1.3 (H) 02/14/2023      Microbiology: Recent Results (from the past 240 hour(s))  Resp panel by RT-PCR (RSV, Flu A&B, Covid) Anterior Nasal Swab      Status: None   Collection Time: 02/13/23 12:26 PM   Specimen: Anterior Nasal Swab  Result Value Ref Range Status   SARS Coronavirus 2 by RT PCR NEGATIVE NEGATIVE Final    Comment: (NOTE) SARS-CoV-2 target nucleic acids are NOT DETECTED.  The SARS-CoV-2 RNA is generally detectable in upper respiratory specimens during the acute phase of infection. The lowest concentration of SARS-CoV-2 viral copies this assay can detect is 138 copies/mL. A negative result does not preclude SARS-Cov-2 infection and should not be used as the sole basis for treatment or other patient management decisions. A negative result may occur with  improper specimen collection/handling, submission of specimen other than nasopharyngeal swab, presence of viral mutation(s) within the areas targeted by this assay, and inadequate number of viral copies(<138 copies/mL). A negative result must be combined with clinical observations, patient history, and  epidemiological information. The expected result is Negative.  Fact Sheet for Patients:  BloggerCourse.com  Fact Sheet for Healthcare Providers:  SeriousBroker.it  This test is no t yet approved or cleared by the [place] FDA and  has been authorized for detection and/or diagnosis of SARS-CoV-2 by FDA under an Emergency Use Authorization (EUA). This EUA will remain  in effect (meaning this test can be used) for the duration of the COVID-19 declaration under Section 564(b)(1) of the Act, 21 U.S.C.section 360bbb-3(b)(1), unless the authorization is terminated  or revoked sooner.       Influenza A by PCR NEGATIVE NEGATIVE Final   Influenza B by PCR NEGATIVE NEGATIVE Final    Comment: (NOTE) The Xpert Xpress SARS-CoV-2/FLU/RSV plus assay is intended as an aid in the diagnosis of influenza from Nasopharyngeal swab specimens and should not be used as a sole basis for treatment. Nasal washings and aspirates are  unacceptable for Xpert Xpress SARS-CoV-2/FLU/RSV testing.  Fact Sheet for Patients: BloggerCourse.com  Fact Sheet for Healthcare Providers: SeriousBroker.it  This test is not yet approved or cleared by the [place] FDA and has been authorized for detection and/or diagnosis of SARS-CoV-2 by FDA under an Emergency Use Authorization (EUA). This EUA will remain in effect (meaning this test can be used) for the duration of the COVID-19 declaration under Section 564(b)(1) of the Act, 21 U.S.C. section 360bbb-3(b)(1), unless the authorization is terminated or revoked.     Resp Syncytial Virus by PCR NEGATIVE NEGATIVE Final    Comment: (NOTE) Fact Sheet for Patients: BloggerCourse.com  Fact Sheet for Healthcare Providers: SeriousBroker.it  This test is not yet approved or cleared by the [place] FDA and has been authorized for detection and/or diagnosis of SARS-CoV-2 by FDA under an Emergency Use Authorization (EUA). This EUA will remain in effect (meaning this test can be used) for the duration of the COVID-19 declaration under Section 564(b)(1) of the Act, 21 U.S.C. section 360bbb-3(b)(1), unless the authorization is terminated or revoked.  Performed at [facility], [address], [place], [place] [address]   Blood Culture (routine x 2)     Status: Abnormal (Preliminary result)   Collection Time: 02/13/23 12:49 PM   Specimen: BLOOD  Result Value Ref Range Status   Specimen Description   Final    BLOOD LEFT ANTECUBITAL Performed at [facility], [address], [place], [place] [address]    Special Requests   Final    BOTTLES DRAWN AEROBIC AND ANAEROBIC Blood Culture adequate volume Performed at [facility], [address], [place], [place] [address]    Culture  Setup Time   Final    GRAM POSITIVE RODS IN  BOTH AEROBIC AND ANAEROBIC BOTTLES CRITICAL RESULT CALLED TO, READ BACK BY AND VERIFIED WITH: PHARMD NICK G [id number] @1130  BY SM Performed at [facility], [address] [place], [place] [address]    Culture LISTERIA MONOCYTOGENES (A)  Final   Report Status PENDING  Incomplete  Blood Culture (routine x 2)     Status: None (Preliminary result)   Collection Time: 02/13/23 12:55 PM   Specimen: BLOOD  Result Value Ref Range Status   Specimen Description   Final    BLOOD SITE NOT SPECIFIED Performed at [facility], [address] [place], [place] [address]    Special Requests   Final    BOTTLES DRAWN AEROBIC AND ANAEROBIC Blood Culture results may not be optimal due to an  excessive volume of blood received in culture bottles Performed at [facility], [address] [place], [place] [address]    Culture  Setup Time   Final    GRAM POSITIVE RODS IN BOTH AEROBIC AND ANAEROBIC BOTTLES CRITICAL VALUE NOTED.  VALUE IS CONSISTENT WITH PREVIOUSLY REPORTED AND CALLED VALUE. Performed at [facility], [address], [place], [place] [address]    Culture GRAM POSITIVE RODS  Final   Report Status PENDING  Incomplete     Serology:    Imaging: If present, new imagings (plain films, ct scans, and mri) have been personally visualized and interpreted; radiology reports have been reviewed. Decision making incorporated into the Impression / Recommendations.  6/4 tte  1. Left ventricular ejection fraction, by estimation, is 55 to 60%. The left ventricle has normal function. The left ventricle has no regional wall motion abnormalities. There is moderate concentric left ventricular hypertrophy. Left ventricular  diastolic parameters were normal.   2. Right ventricular systolic function is normal. The right ventricular size is normal.   3. The mitral valve is grossly normal. No evidence of mitral valve regurgitation. No evidence of mitral stenosis.    4. The aortic valve is grossly normal. There is mild calcification of the aortic valve. There is mild thickening of the aortic valve. Aortic valve regurgitation is mild. No aortic stenosis is present.   5. There is borderline dilatation of the ascending aorta, measuring 41 mm.   6. The inferior vena cava is normal in size with greater than 50% respiratory variability, suggesting right atrial pressure of 3 mmHg.   7. Technically difficult study; there is mild AI with thickening of the aortic valve. Consider TEE for further evaluation if clinically indicated.    6/3 mri thoracic and lumbar spine 1. No acute abnormality of the thoracic or lumbar spine. 2. Mild spinal canal stenosis and moderate left neural foraminal stenosis at L4-L5. 3. Severe facet arthrosis at L5-S1 with grade 1 anterolisthesis and mild spinal canal stenosis. 4. Unchanged thoracic and abdominal aortic aneurysms compared to 12/29/2021.   6/2 cxr 1. Stable radiographic contour of very large Descending Thoracic Aortic Aneurysm demonstrated by CTA in April (approximately 6.2 cm at that time). 2. No new cardiopulmonary abnormality identified.   Raymondo Band, MD [facility] [phone] pager    02/15/2023, 10:38 AM

## 2023-02-15 NOTE — Progress Notes (Addendum)
[facility] PROGRESS NOTE  Chester Flammia (DOB: 1936-08-24) QMV:[id number] PCP: Ardith Dark, MD  Brief Narrative: Maslah Gucciardo is an 87 y.o. male with a history of CVA, T2DM, HTN, HLD, stage IIIa CKD, BPH, anemia, thrombocytopenia, OA, and AAA managed medically due to prohibitive surgical risk who presented to the ED on 02/13/2023 with fever and chills, fatigue. He's had no recent changes in symptoms, but has had nonproductive cough for many weeks. Initially afebrile and tachycardic with modest hypoxemia requiring 2L O2. Rare bacteriuria noted on UA. WBC 18.6k. Despite reassuring CXR, given the patient's fever, leukocytosis, hypoxia, and cough, antibiotics to treat for pneumonia were given and patient was admitted. He had back pain and MRI ruled out spinal/epidural infection. Blood cultures grew gram-positive rods which tested positive for Listeria. The patient does confirm diarrhea prior to arrival. No meningitic signs. Antibiotics changed with ID consultation to ampicillin and gentamicin.   Subjective: Febrile again last night, but says he is starting to feel better this morning. No diarrhea. Denies neck pain, stiffness, headache, photophobia. No abd pain or N/V currently. No diarrhea but did have this PTA. Cough is minimal and at its chronic baseline.  Objective: BP 101/85 (BP Location: Left Wrist)   Pulse 98   Temp 99.3 F (37.4 C) (Oral)   Resp 18   Ht 5\' 5"  (1.651 m)   Wt 69.4 kg   SpO2 97%   BMI 25.46 kg/m   Gen: No distress Neck: Soft, supple, no rigidity. touches chin to chest without pain.  Pulm: Clear, nonlabored, no wheezes  CV: RRR, no MRG or pitting edema GI: Soft, NT, ND, +BS  Neuro: Alert and oriented. No new focal deficits. Ext: Warm, no deformities. Skin: No rashes, lesions or ulcers on visualized skin   Assessment & Plan: Principal Problem:   Acute respiratory failure with hypoxia (HCC) Active Problems:   HTN (hypertension)   Hyperlipidemia    Normocytic anemia   Pre-diabetes   Chronic obstructive pulmonary disease (HCC)   Stage 3a chronic kidney disease (HCC)   Thrombocytopenia (HCC)   AAA (abdominal aortic aneurysm) (HCC)  Listeria bacteremia:  - Echocardiogram ordered - Repeat blood cultures ordered - Consulted ID, Dr. Renold Don, who recommends ampicillin and gentamicin, will evaluate formally.  - MR T and L spine ordered due to acute onset back pain with fever, did not show evidence of infection.   Acute hypoxic respiratory failure: due to possible pneumonia on COPD.  - Weaned to room air. No infiltrate on CXR, consider PE evaluation if hypoxia is recurrent.   AAA: Type 2 thoracoabdominal aneurysm (6.2cm) and bilateral hypogastric artery aneurysms (R 3.1cm and left 4.4cm) - Managed medically due to prohibitive surgical risk. Has followed up with [facility] vascular surgery, last available note is July 2023 at which point the recommended follow up only if he wished for surgery.   AKI on stage IIIa CKD: Improving Cr 1.7 > 1.5 with baseline 1.2-1.4.  - Support with IVF, monitor daily.   NO CPR: Pt would wish for ACLS medications/AED and ventilatory support in the event of cardiac/respiratory arrest, but would not want CPR.   Hyponatremia: Monitor, improving.  Hyperbilirubinemia: Improving, monitoring.   Normocytic anemia, thrombocytopenia: All counts down with IVF, but both are chronic. - Monitor - Heparin Vander for VTE ppx now that no LP planned.  HLD:  - Continue rosuvastatin  Prediabetes:  - SSI  Tyrone Nine, MD [facility] [email or web address] 02/15/2023, 10:49 AM

## 2023-02-15 NOTE — Progress Notes (Signed)
Pharmacy Antibiotic Note  [name] is a 87 y.o. male admitted on 02/13/2023 with bacteremia.  Pharmacy has been consulted for Vanco dosing.  ID: Fever, leukocytosis now with 4/4 GP Rods bacteremia.New severe back pain.  - WBC 20.7>14.8  Azithro 6/2>6/4 Rocephin 6/2>6/4 Unasyn 6/4>> Vanco 6/4>>  6/2: Fluvid/RSV: neg 6/2: BC  4/4: GP Rods  Plan: Unasyn 3g IV q6hr Vancomycin 1500 mg IV Q 48 hrs. Goal AUC 400-550. Expected AUC: 508 SCr used: 1.58 Discussed with ID Pharmacist Labs to relook at [name] today.    Height: 5\' 5"  (165.1 cm) Weight: 69.4 kg (153 lb) IBW/kg (Calculated) : 61.5  Temp (24hrs), Avg:99.4 F (37.4 C), Min:98.2 F (36.8 C), Max:101.5 F (38.6 C)  Recent Labs  Lab 02/13/23 1249 02/13/23 1520 02/14/23 0415 02/15/23 0417  WBC 18.6*  --  20.7* 14.8*  CREATININE 1.72*  --  1.51* 1.58*  LATICACIDVEN 1.1 1.6  --   --     Estimated Creatinine Clearance: 30.3 mL/min (A) (by C-G formula based on SCr of 1.58 mg/dL (H)).    Allergies  Allergen Reactions   Ace Inhibitors     cough   [place] Antolin S. [name], PharmD, BCPS Clinical Staff Pharmacist [email or web address]  [name] 02/15/2023 8:11 AM

## 2023-02-15 NOTE — Progress Notes (Signed)
Mobility Specialist - Progress Note  Pre-mobility: 96 bpm HR, 93% SpO2 During mobility: 108 bpm HR, 89% SpO2 Post-mobility: 99 bpm HR, 97% SPO2   02/15/23 1551  Oxygen Therapy  O2 Device Nasal Cannula  O2 Flow Rate (L/min) 2 L/min  Patient Activity (if Appropriate) Ambulating  Mobility  Activity Ambulated with assistance in hallway  Level of Assistance Contact guard assist, steadying assist  Assistive Device Front wheel walker  Distance Ambulated (ft) 50 ft  Range of Motion/Exercises Active  Activity Response Tolerated well  Mobility Referral Yes  $Mobility charge 1 Mobility  Mobility Specialist Start Time (ACUTE ONLY) 1530  Mobility Specialist Stop Time (ACUTE ONLY) 1545  Mobility Specialist Time Calculation (min) (ACUTE ONLY) 15 min   Pt was found in bed and agreeable to ambulate. Stated feeling weak during ambulation. At EOS returned to bed with all necessities and family in room.  Billey Chang Mobility Specialist

## 2023-02-15 NOTE — Progress Notes (Signed)
Pharmacy Antibiotic Note  [name] is a 87 y.o. male admitted on 02/13/2023 with listeria bacteremia. Pharmacy has been consulted for gentamicin  dosing for combination therapy for disseminated listeria. Noted patient with AKI on CKD Stage IIIa with Scr 1.58 today so will monitor renal function closely.  Plan: Ampicillin 2 gm IV Q 6 hours  Gentamicin 100 mg every 24 hours per current renal function Monitor renal function, clinical status Noted dated on combination therapy is mixed- To discuss length of therapy with ID MD this afternoon   Height: 5\' 5"  (165.1 cm) Weight: 69.4 kg (153 lb) IBW/kg (Calculated) : 61.5  Temp (24hrs), Avg:99.4 F (37.4 C), Min:98.2 F (36.8 C), Max:101.5 F (38.6 C)  Recent Labs  Lab 02/13/23 1249 02/13/23 1520 02/14/23 0415 02/15/23 0417  WBC 18.6*  --  20.7* 14.8*  CREATININE 1.72*  --  1.51* 1.58*  LATICACIDVEN 1.1 1.6  --   --     Estimated Creatinine Clearance: 30.3 mL/min (A) (by C-G formula based on SCr of 1.58 mg/dL (H)).    Allergies  Allergen Reactions   Ace Inhibitors     cough      Thank you for allowing pharmacy to be a part of this patient's care.  Sharin Mons, PharmD, BCPS, BCIDP Infectious Diseases Clinical Pharmacist Phone: [phone] 02/15/2023 8:55 AM

## 2023-02-15 NOTE — Progress Notes (Signed)
  Echocardiogram 2D Echocardiogram has been performed.  Milda Smart 02/15/2023, 11:44 AM

## 2023-02-16 ENCOUNTER — Other Ambulatory Visit: Payer: Self-pay

## 2023-02-16 DIAGNOSIS — N1831 Chronic kidney disease, stage 3a: Secondary | ICD-10-CM

## 2023-02-16 DIAGNOSIS — N179 Acute kidney failure, unspecified: Secondary | ICD-10-CM

## 2023-02-16 DIAGNOSIS — A329 Listeriosis, unspecified: Secondary | ICD-10-CM

## 2023-02-16 DIAGNOSIS — R6520 Severe sepsis without septic shock: Secondary | ICD-10-CM | POA: Diagnosis not present

## 2023-02-16 DIAGNOSIS — J410 Simple chronic bronchitis: Secondary | ICD-10-CM | POA: Diagnosis not present

## 2023-02-16 DIAGNOSIS — J9601 Acute respiratory failure with hypoxia: Secondary | ICD-10-CM | POA: Diagnosis not present

## 2023-02-16 DIAGNOSIS — A419 Sepsis, unspecified organism: Secondary | ICD-10-CM

## 2023-02-16 LAB — COMPREHENSIVE METABOLIC PANEL
ALT: 13 U/L (ref 0–44)
AST: 18 U/L (ref 15–41)
Albumin: 2.6 g/dL — ABNORMAL LOW (ref 3.5–5.0)
Alkaline Phosphatase: 49 U/L (ref 38–126)
Anion gap: 8 (ref 5–15)
BUN: 22 mg/dL (ref 8–23)
CO2: 24 mmol/L (ref 22–32)
Calcium: 7.7 mg/dL — ABNORMAL LOW (ref 8.9–10.3)
Chloride: 99 mmol/L (ref 98–111)
Creatinine, Ser: 1.5 mg/dL — ABNORMAL HIGH (ref 0.61–1.24)
GFR, Estimated: 46 mL/min — ABNORMAL LOW (ref >60)
Glucose, Bld: 185 mg/dL — ABNORMAL HIGH (ref 70–99)
Potassium: 4 mmol/L (ref 3.5–5.1)
Sodium: 131 mmol/L — ABNORMAL LOW (ref 135–145)
Total Bilirubin: 0.5 mg/dL (ref 0.3–1.2)
Total Protein: 5.9 g/dL — ABNORMAL LOW (ref 6.5–8.1)

## 2023-02-16 LAB — GLUCOSE, CAPILLARY
Glucose-Capillary: 111 mg/dL — ABNORMAL HIGH (ref 70–99)
Glucose-Capillary: 145 mg/dL — ABNORMAL HIGH (ref 70–99)
Glucose-Capillary: 192 mg/dL — ABNORMAL HIGH (ref 70–99)
Glucose-Capillary: 122 mg/dL — ABNORMAL HIGH (ref 70–99)

## 2023-02-16 LAB — CBC
HCT: 28 % — ABNORMAL LOW (ref 39.0–52.0)
Hemoglobin: 8.8 g/dL — ABNORMAL LOW (ref 13.0–17.0)
MCH: 29.5 pg (ref 26.0–34.0)
MCHC: 31.4 g/dL (ref 30.0–36.0)
MCV: 94 fL (ref 80.0–100.0)
Platelets: 77 10*3/uL — ABNORMAL LOW (ref 150–400)
RBC: 2.98 MIL/uL — ABNORMAL LOW (ref 4.22–5.81)
RDW: 14.6 % (ref 11.5–15.5)
WBC: 9.6 10*3/uL (ref 4.0–10.5)
nRBC: 0 % (ref 0.0–0.2)

## 2023-02-16 LAB — CULTURE, BLOOD (ROUTINE X 2): Special Requests: ADEQUATE

## 2023-02-16 MED ORDER — POLYETHYLENE GLYCOL 3350 17 G PO PACK
17.0000 g | PACK | Freq: Every day | ORAL | Status: DC
  Administered 2023-02-16 – 2023-02-17 (×2): 17 g via ORAL
  Filled 2023-02-16 (×2): qty 1

## 2023-02-16 MED ORDER — LACTATED RINGERS IV SOLN: INTRAVENOUS | Status: DC

## 2023-02-16 MED ORDER — AMPICILLIN IV (FOR PTA / DISCHARGE USE ONLY)
8.0000 g | INTRAVENOUS | 0 refills | Status: AC
Start: 2023-02-16 — End: 2023-03-08

## 2023-02-16 NOTE — TOC Initial Note (Signed)
Transition of Care [facility]) - Initial/Assessment Note    Patient Details  Name: [name] MRN: [id number] Date of Birth: 05-08-1936  Transition of Care [facility]) CM/SW Contact:    Larrie Kass, LCSW Phone Number: 02/16/2023, 1:34 PM  Clinical Narrative:                 CSW met with pt and pt's family regarding home health for IV abx. Pt's daughter Amil Amen reports she lives with pt and will be caring for the pt at home. CSW discuss the need for [facility] RN to assist with IV abx. Pt does not have a preference in [facility] agency, [facility] accepted pt for [facility]. Pt will need HH orders placed, MD made aware. Pt's daughter reports pt will not need transportation assistance. Jeri Modena with [employer] is following. TOC to follow.   Expected Discharge Plan: Home w Home Health Services Barriers to Discharge: Continued Medical Work up   Patient Goals and CMS Choice Patient states their goals for this hospitalization and ongoing recovery are:: return home CMS Medicare.gov Compare Post Acute Care list provided to:: Patient Choice offered to / list presented to : Patient      Expected Discharge Plan and Services In-house Referral: Clinical Social Work     Living arrangements for the past 2 months: Single Family Home                           HH Arranged: RN HH Agency: [facility] Date [facility] Agency Contacted: 02/16/23 Time HH Agency Contacted: 1333 Representative spoke with at [facility] Agency: Tresa Endo  Prior Living Arrangements/Services Living arrangements for the past 2 months: Single Family Home Lives with:: Self, Adult Children, Spouse Patient language and need for interpreter reviewed:: Yes Do you feel safe going back to the place where you live?: Yes      Need for Family Participation in Patient Care: No (Comment) Care giver support system in place?: Yes (comment)   Criminal Activity/Legal Involvement Pertinent to Current Situation/Hospitalization: No - Comment as  needed  Activities of Daily Living Home Assistive Devices/Equipment: Eyeglasses, Environmental consultant (specify type), CBG Meter, Dentures (specify type) ADL Screening (condition at time of admission) Patient's cognitive ability adequate to safely complete daily activities?: Yes Is the patient deaf or have difficulty hearing?: No Does the patient have difficulty seeing, even when wearing glasses/contacts?: No (uses reading glasses) Does the patient have difficulty concentrating, remembering, or making decisions?: No Patient able to express need for assistance with ADLs?: Yes Does the patient have difficulty dressing or bathing?: No Independently performs ADLs?: Yes (appropriate for developmental age) Does the patient have difficulty walking or climbing stairs?: No Weakness of Legs: None Weakness of Arms/Hands: None  Permission Sought/Granted   Permission granted to share information with : Yes, Release of Information Signed  Share Information with NAME: Batz,JULIA (Daughter)     Permission granted to share info w Relationship: daughter  Permission granted to share info w Contact Information: [phone] (Mobile)  Emotional Assessment Appearance:: Appears stated age Attitude/Demeanor/Rapport: Gracious Affect (typically observed): Accepting Orientation: : Oriented to Self, Oriented to Place, Oriented to  Time, Oriented to Situation   Psych Involvement: No (comment)  Admission diagnosis:  Acute respiratory failure with hypoxia (HCC) [J96.01] Acute kidney injury [facility]) [N17.9] Patient Active Problem List   Diagnosis Date Noted   Listeriosis 02/15/2023   Acute respiratory failure with hypoxia (HCC) 02/13/2023   Allergic rhinitis 01/20/2023   Leg edema 12/21/2022  Descending thoracic aortic aneurysm (HCC) 03/29/2022   AAA (abdominal aortic aneurysm) (HCC) 03/29/2022   Vitamin B12 deficiency 11/30/2021   Vitamin D deficiency 11/30/2021   Pre-diabetes 02/14/2020   Thrombocytopenia (HCC)  12/12/2018   Chronic obstructive pulmonary disease (HCC) 11/14/2014   Benign prostatic hyperplasia with nocturia 11/14/2014   Primary osteoarthritis involving multiple joints 11/14/2014   Former heavy tobacco smoker 12/04/2013   Normocytic anemia 03/02/2012   CVA (cerebral vascular accident) (HCC) 03/01/2012   HTN (hypertension) 03/01/2012   Hyperlipidemia 03/01/2012   Stage 3a chronic kidney disease (HCC) 03/01/2012   PCP:  Ardith Dark, MD Pharmacy:   [facility] (Now [facility]) - [address] [place], [place] - [address] [place] [address] Phone: [phone] Fax: [phone]  [facility] [id number] - [place], [place] - [address] [place] [address] Phone: [phone] Fax: [phone]  [facility] - [place], [place] - [address] [place] [address] Phone: [phone] Fax: [phone]     Social Determinants of Health (SDOH) Social History: SDOH Screenings   Food Insecurity: No Food Insecurity (02/13/2023)  Housing: Low Risk  (02/13/2023)  Transportation Needs: No Transportation Needs (02/13/2023)  Utilities: Not At Risk (02/13/2023)  Depression (PHQ2-9): Low Risk  (01/20/2023)  Financial Resource Strain: Low Risk  (05/27/2022)  Physical Activity: Inactive (05/27/2022)  Social Connections: Moderately Isolated (05/27/2022)  Stress: No Stress Concern Present (05/27/2022)  Tobacco Use: Medium Risk (02/13/2023)   SDOH Interventions:     Readmission Risk Interventions     No data to display

## 2023-02-16 NOTE — Progress Notes (Signed)
[facility]                                                                              [name], is a 87 y.o. male, DOB - [birth year], ZOX:[id number] Admit date - 02/13/2023    Outpatient Primary MD for the patient is [name], Ryan Degree, MD  LOS - 3  days  Chief Complaint  Patient presents with   Fever       Brief summary   Patient is a 87 y.o. male with a history of CVA, T2DM, HTN, HLD, stage IIIa CKD, BPH, anemia, thrombocytopenia, OA, and AAA managed medically due to prohibitive surgical risk who presented to the ED on 02/13/2023 with fever and chills, fatigue. He's had no recent changes in symptoms, but has had nonproductive cough for many weeks. Initially afebrile and tachycardic with modest hypoxemia requiring 2L O2. Rare bacteriuria noted on UA. WBC 18.6k. Despite reassuring CXR, given the patient's fever, leukocytosis, hypoxia, and cough, antibiotics to treat for pneumonia were given and patient was admitted.  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  ID consulted.  Assessment & Plan    Principal Problem:   Acute respiratory failure with hypoxia (HCC), due to COPD -Chest x-ray showed no infiltrates, O2 sats 94-95% on room air   Active Problems:  Listeria bacteremia:  -2D echo showed EF of 55 to 60%, moderate LVH, normal right ventricular systolic function, no vegetations -- MRI T and L spine due to acute onset back pain with fever, did not show evidence of infection.  -ID consulted, placed on amp/gent, end date 6/6 and then plan for 3 weeks of ampicillin -PICC line tomorrow if 6/4 blood cultures remain negative -ID clinic arranged on 03/15/2023 at 11:15 with Dr. Elinor Wade      AAA:  - Type 2 thoracoabdominal aneurysm (6.2cm) and bilateral hypogastric artery aneurysms (R 3.1cm and left 4.4cm) - Managed medically due to prohibitive surgical risk. Has followed up with [facility] vascular surgery, last available note is July 2023 at which point the  recommended follow up only if he wished for surgery.  -Abdominal ultrasound showed AAA 5.9 cm, aneurysmal dilatation of both common iliac arteries, most on the left dilated to 6 cm.  Discussed with patient and wife, patient is followed at [facility] for the same.     AKI on stage IIIa CKD: -  Improving Cr 1.7 > 1.5 with baseline 1.2-1.4.  -Creatinine stable at 1.5     Hyponatremia: -Stable, asymptomatic   Hyperbilirubinemia: -Resolved    Normocytic anemia, thrombocytopenia:  -Continue to monitor, H&H and PLTs trending down -Chronic   HLD:  - Continue rosuvastatin   Prediabetes:  - SSI   Estimated body mass index is 25.46 kg/m as calculated from the following:   Height as of this encounter: 5\' 5"  (1.651 m).   Weight as of this encounter: 69.4 kg.   NO CPR: Pt would wish for ACLS medications/AED and ventilatory support in the event of cardiac/respiratory arrest, but would not want CPR.   Code Status: Full code but no CPR DVT Prophylaxis:  heparin injection 5,000 Units Start: 02/15/23 1400 SCDs Start: 02/13/23  1557   Level of Care: Level of care: Progressive Family Communication: Updated patient's wife at the bedside Disposition Plan:      Remains inpatient appropriate: Hopefully home with PICC line tomorrow if blood cultures remain negative   Procedures:    Consultants:   Infectious disease  Antimicrobials:   Anti-infectives (From admission, onward)    Start     Dose/Rate Route Frequency Ordered Stop   02/15/23 1200  gentamicin (GARAMYCIN) IVPB 100 mg        100 mg 200 mL/hr over 30 Minutes Intravenous Every 24 hours 02/15/23 0913 02/18/23 1159   02/15/23 0930  ampicillin (OMNIPEN) 2 g in sodium chloride 0.9 % 100 mL IVPB        2 g 300 mL/hr over 20 Minutes Intravenous Every 6 hours 02/15/23 0842     02/15/23 0900  vancomycin (VANCOREADY) IVPB 1250 mg/250 mL  Status:  Discontinued        1,250 mg 166.7 mL/hr over 90 Minutes Intravenous  Once 02/15/23 0800 02/15/23  0810   02/15/23 0900  Ampicillin-Sulbactam (UNASYN) 3 g in sodium chloride 0.9 % 100 mL IVPB  Status:  Discontinued        3 g 200 mL/hr over 30 Minutes Intravenous Every 6 hours 02/15/23 0801 02/15/23 0842   02/15/23 0900  vancomycin (VANCOREADY) IVPB 1500 mg/300 mL  Status:  Discontinued        1,500 mg 150 mL/hr over 120 Minutes Intravenous Every 48 hours 02/15/23 0811 02/15/23 0842   02/15/23 0845  metroNIDAZOLE (FLAGYL) IVPB 500 mg  Status:  Discontinued        500 mg 100 mL/hr over 60 Minutes Intravenous Every 12 hours 02/15/23 0747 02/15/23 0755   02/13/23 1330  cefTRIAXone (ROCEPHIN) 2 g in sodium chloride 0.9 % 100 mL IVPB  Status:  Discontinued        2 g 200 mL/hr over 30 Minutes Intravenous Every 24 hours 02/13/23 1317 02/15/23 0747   02/13/23 1330  azithromycin (ZITHROMAX) 500 mg in sodium chloride 0.9 % 250 mL IVPB  Status:  Discontinued        500 mg 250 mL/hr over 60 Minutes Intravenous Every 24 hours 02/13/23 1317 02/15/23 0747          Medications  amLODipine  5 mg Oral BID   heparin injection (subcutaneous)  5,000 Units Subcutaneous Q8H   insulin aspart  0-9 Units Subcutaneous TID WC   polyethylene glycol  17 g Oral Daily   rosuvastatin  40 mg Oral Daily      Subjective:   [name] was seen and examined today.  No acute complaints, wife at the bedside. Patient denies dizziness, chest pain, shortness of breath, abdominal pain, N/V.  No fevers or chills. No acute events overnight.    Objective:   Vitals:   02/15/23 2037 02/16/23 0412 02/16/23 0949 02/16/23 1202  BP: 117/83 95/62 (!) 93/55 106/71  Pulse: 88 72  81  Resp: 18 18  18   Temp: 99.4 F (37.4 C) 98.4 F (36.9 C)  98.3 F (36.8 C)  TempSrc: Oral Oral  Oral  SpO2: 95% 94%  95%  Weight:      Height:        Intake/Output Summary (Last 24 hours) at 02/16/2023 1329 Last data filed at 02/16/2023 0500 Gross per 24 hour  Intake 740 ml  Output --  Net 740 ml     Wt Readings from Last  3 Encounters:  02/13/23 69.4 kg  01/20/23 74.2 kg  12/30/22 73.5 kg     Exam General: Alert and oriented x 3, NAD Cardiovascular: S1 S2 auscultated,  RRR Respiratory: Clear to auscultation bilaterally Gastrointestinal: Soft, nontender, nondistended, + bowel sounds Ext: no pedal edema bilaterally Neuro: no new deficits Skin: No rashes Psych: Normal affect     Data Reviewed:  I have personally reviewed following labs    CBC Lab Results  Component Value Date   WBC 9.6 02/16/2023   RBC 2.98 (L) 02/16/2023   HGB 8.8 (L) 02/16/2023   HCT 28.0 (L) 02/16/2023   MCV 94.0 02/16/2023   MCH 29.5 02/16/2023   PLT 77 (L) 02/16/2023   MCHC 31.4 02/16/2023   RDW 14.6 02/16/2023   LYMPHSABS 0.2 (L) 02/13/2023   MONOABS 1.0 02/13/2023   EOSABS 0.0 02/13/2023   BASOSABS 0.0 02/13/2023     Last metabolic panel Lab Results  Component Value Date   NA 131 (L) 02/16/2023   K 4.0 02/16/2023   CL 99 02/16/2023   CO2 24 02/16/2023   BUN 22 02/16/2023   CREATININE 1.50 (H) 02/16/2023   GLUCOSE 185 (H) 02/16/2023   GFRNONAA 46 (L) 02/16/2023   GFRAA 70 08/15/2020   CALCIUM 7.7 (L) 02/16/2023   PROT 5.9 (L) 02/16/2023   ALBUMIN 2.6 (L) 02/16/2023   BILITOT 0.5 02/16/2023   ALKPHOS 49 02/16/2023   AST 18 02/16/2023   ALT 13 02/16/2023   ANIONGAP 8 02/16/2023    CBG (last 3)  Recent Labs    02/15/23 2055 02/16/23 0743 02/16/23 1135  GLUCAP 147* 111* 145*      Coagulation Profile: Recent Labs  Lab 02/13/23 1249  INR 1.4*     Radiology Studies: I have personally reviewed the imaging studies  [place] EKG SITE RITE  Result Date: 02/16/2023 If Site Rite image not attached, placement could not be confirmed due to current cardiac rhythm.  US AORTA  Result Date: 02/15/2023 CLINICAL DATA:  Abdominal pain EXAM: ULTRASOUND OF ABDOMINAL AORTA TECHNIQUE: Ultrasound examination of the abdominal aorta and proximal common iliac arteries was performed to evaluate for aneurysm.  Additional color and Doppler images of the distal aorta were obtained to document patency. COMPARISON:  MRI from the previous day. FINDINGS: Abdominal aortic measurements as follows: Proximal:  5.9 cm Mid:  5.8 cm Distal:  2.9 cm Patent: Yes, peak systolic velocity is 57.7 cm/s Right common iliac artery: 3.8 cm Left common iliac artery: 6.0 cm IMPRESSION: Abdominal aortic aneurysm measuring up to 5.9 cm. 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 Recommend referral to a vascular specialist. Additionally CTA of the chest, abdomen and pelvis would be helpful for more dedicated evaluation. This recommendation follows ACR consensus guidelines: White Paper of the ACR Incidental Findings Committee II on Vascular Findings. J Am Coll Radiol 2013; 10:789-794. These results will be called to the ordering clinician or representative by the Radiologist Assistant, and communication documented in the PACS or [employer]. Electronically Signed   By: Alcide Clever M.D.   On: 02/15/2023 20:41   ECHOCARDIOGRAM COMPLETE  Result Date: 02/15/2023    ECHOCARDIOGRAM REPORT   Patient Name:   DONTRELLE SIEKER Date of Exam: 02/15/2023 Medical Rec #:  [id number]          Height:       65.0 in Accession #:    [id number]         Weight:  153.0 lb Date of Birth:  [birth year]          BSA:          1.765 m Patient Age:    86 years           BP:           101/85 mmHg Patient Gender: M                  HR:           91 bpm. Exam Location:  Inpatient Procedure: 2D Echo, Cardiac Doppler and Color Doppler Indications:    Bacteremia  History:        Patient has prior history of Echocardiogram examinations, most                 recent 03/10/2015. COPD and Stroke; Risk Factors:Hypertension,                 Dyslipidemia and Diabetes.  Sonographer:    Milda Smart Referring Phys: [id number]  Sonographer Comments: Image acquisition challenging due to patient body habitus.  IMPRESSIONS  1. Left ventricular ejection fraction, by estimation, is 55 to 60%. The left ventricle has normal function. The left ventricle has no regional wall motion abnormalities. There is moderate concentric left ventricular hypertrophy. Left ventricular diastolic parameters were normal.  2. Right ventricular systolic function is normal. The right ventricular size is normal.  3. The mitral valve is grossly normal. No evidence of mitral valve regurgitation. No evidence of mitral stenosis.  4. The aortic valve is grossly normal. There is mild calcification of the aortic valve. There is mild thickening of the aortic valve. Aortic valve regurgitation is mild. No aortic stenosis is present.  5. There is borderline dilatation of the ascending aorta, measuring 41 mm.  6. The inferior vena cava is normal in size with greater than 50% respiratory variability, suggesting right atrial pressure of 3 mmHg.  7. Technically difficult study; there is mild AI with thickening of the aortic valve. Consider TEE for further evaluation if clinically indicated. FINDINGS  Left Ventricle: Left ventricular ejection fraction, by estimation, is 55 to 60%. The left ventricle has normal function. The left ventricle has no regional wall motion abnormalities. The left ventricular internal cavity size was normal in size. There is  moderate concentric left ventricular hypertrophy. Left ventricular diastolic parameters were normal. Right Ventricle: The right ventricular size is normal. No increase in right ventricular wall thickness. Right ventricular systolic function is normal. Left Atrium: Left atrial size was normal in size. Right Atrium: Right atrial size was normal in size. Pericardium: There is no evidence of pericardial effusion. Mitral Valve: The mitral valve is grossly normal. No evidence of mitral valve regurgitation. No evidence of mitral valve stenosis. Tricuspid Valve: The tricuspid valve is normal in structure. Tricuspid valve  regurgitation is not demonstrated. No evidence of tricuspid stenosis. Aortic Valve: The aortic valve is grossly normal. There is mild calcification of the aortic valve. There is mild thickening of the aortic valve. Aortic valve regurgitation is mild. Aortic regurgitation PHT measures 307 msec. No aortic stenosis is present. Pulmonic Valve: The pulmonic valve was grossly normal. Pulmonic valve regurgitation is not visualized. No evidence of pulmonic stenosis. Aorta: The aortic root is normal in size and structure. There is borderline dilatation of the ascending aorta, measuring 41 mm. Venous: The inferior vena cava is normal in size with greater than 50% respiratory variability, suggesting right atrial pressure of  3 mmHg. IAS/Shunts: No atrial level shunt detected by color flow Doppler.  LEFT VENTRICLE PLAX 2D LVIDd:         4.90 cm     Diastology LVIDs:         3.90 cm     LV e' medial:    6.20 cm/s LV PW:         1.40 cm     LV E/e' medial:  10.0 LV IVS:        1.40 cm     LV e' lateral:   8.27 cm/s LVOT diam:     2.40 cm     LV E/e' lateral: 7.5 LV SV:         97 LV SV Index:   55 LVOT Area:     4.52 cm  LV Volumes (MOD) LV vol d, MOD A2C: 58.9 ml LV vol d, MOD A4C: 64.3 ml LV vol s, MOD A2C: 28.9 ml LV vol s, MOD A4C: 31.4 ml LV SV MOD A2C:     30.0 ml LV SV MOD A4C:     64.3 ml LV SV MOD BP:      32.2 ml RIGHT VENTRICLE             IVC RV S prime:     16.80 cm/s  IVC diam: 2.00 cm TAPSE (M-mode): 1.8 cm LEFT ATRIUM             Index        RIGHT ATRIUM           Index LA diam:        4.60 cm 2.61 cm/m   RA Area:     16.30 cm LA Vol (A2C):   32.7 ml 18.52 ml/m  RA Volume:   34.40 ml  19.49 ml/m LA Vol (A4C):   41.6 ml 23.57 ml/m LA Biplane Vol: 37.2 ml 21.07 ml/m  AORTIC VALVE LVOT Vmax:   139.00 cm/s LVOT Vmean:  101.000 cm/s LVOT VTI:    0.214 m AI PHT:      307 msec  AORTA Ao Root diam: 4.00 cm Ao Asc diam:  4.10 cm MITRAL VALVE MV Area (PHT): 3.10 cm    SHUNTS MV Decel Time: 245 msec    Systemic VTI:   0.21 m MV E velocity: 62.10 cm/s  Systemic Diam: 2.40 cm MV A velocity: 77.60 cm/s MV E/A ratio:  0.80 Aditya Sabharwal Electronically signed by Dorthula Nettles Signature Date/Time: 02/15/2023/11:56:02 AM    Final    MR Lumbar Spine W Wo Contrast  Result Date: 02/15/2023 CLINICAL DATA:  Back pain, infection suspected EXAM: MRI THORACIC AND LUMBAR SPINE WITHOUT AND WITH CONTRAST TECHNIQUE: Multiplanar and multiecho pulse sequences of the thoracic and lumbar spine were obtained without and with intravenous contrast. CONTRAST:  7mL GADAVIST GADOBUTROL 1 MMOL/ML IV SOLN COMPARISON:  CT thoracic spine 12/29/2021 FINDINGS: MRI THORACIC SPINE FINDINGS Alignment:  Grade 1 anterolisthesis at T9-10 and T10-11. Vertebrae: Chronic wedge compression fracture of T7. T9 hemangioma. No acute fracture or discitis-osteomyelitis. Cord:  Normal Paraspinal and other soft tissues: Known descending thoracic aortic aneurysm. Diameter of 6.2 cm is unchanged. Disc levels: No spinal canal stenosis. MRI LUMBAR SPINE FINDINGS Segmentation:  Standard. Alignment:  Grade 1 anterolisthesis at L5-S1 Vertebrae:  No fracture, evidence of discitis, or bone lesion. Conus medullaris: Extends to the L1 level and appears normal. Paraspinal and other soft tissues: Infrarenal abdominal aortic aneurysm measures 5.0 cm, unchanged Disc levels: L1-L2: Normal  disc space and facet joints. No spinal canal stenosis. No neural foraminal stenosis. L2-L3: Normal disc space and facet joints. No spinal canal stenosis. No neural foraminal stenosis. L3-L4: Left asymmetric disc bulge. Mild facet hypertrophy. No spinal canal stenosis. No neural foraminal stenosis. L4-L5: Intermediate sized left asymmetric disc bulge with moderate facet arthrosis. Mild spinal canal stenosis. Moderate left neural foraminal stenosis. L5-S1: Severe facet arthrosis with partial disc uncovering. Mild spinal canal stenosis. No neural foraminal stenosis. Visualized sacrum: Normal. IMPRESSION: 1.  No acute abnormality of the thoracic or lumbar spine. 2. Mild spinal canal stenosis and moderate left neural foraminal stenosis at L4-L5. 3. Severe facet arthrosis at L5-S1 with grade 1 anterolisthesis and mild spinal canal stenosis. 4. Unchanged thoracic and abdominal aortic aneurysms compared to 12/29/2021. Electronically Signed   By: Deatra Robinson M.D.   On: 02/15/2023 00:50   MR THORACIC SPINE W WO CONTRAST  Result Date: 02/15/2023 CLINICAL DATA:  Back pain, infection suspected EXAM: MRI THORACIC AND LUMBAR SPINE WITHOUT AND WITH CONTRAST TECHNIQUE: Multiplanar and multiecho pulse sequences of the thoracic and lumbar spine were obtained without and with intravenous contrast. CONTRAST:  7mL GADAVIST GADOBUTROL 1 MMOL/ML IV SOLN COMPARISON:  CT thoracic spine 12/29/2021 FINDINGS: MRI THORACIC SPINE FINDINGS Alignment:  Grade 1 anterolisthesis at T9-10 and T10-11. Vertebrae: Chronic wedge compression fracture of T7. T9 hemangioma. No acute fracture or discitis-osteomyelitis. Cord:  Normal Paraspinal and other soft tissues: Known descending thoracic aortic aneurysm. Diameter of 6.2 cm is unchanged. Disc levels: No spinal canal stenosis. MRI LUMBAR SPINE FINDINGS Segmentation:  Standard. Alignment:  Grade 1 anterolisthesis at L5-S1 Vertebrae:  No fracture, evidence of discitis, or bone lesion. Conus medullaris: Extends to the L1 level and appears normal. Paraspinal and other soft tissues: Infrarenal abdominal aortic aneurysm measures 5.0 cm, unchanged Disc levels: L1-L2: Normal disc space and facet joints. No spinal canal stenosis. No neural foraminal stenosis. L2-L3: Normal disc space and facet joints. No spinal canal stenosis. No neural foraminal stenosis. L3-L4: Left asymmetric disc bulge. Mild facet hypertrophy. No spinal canal stenosis. No neural foraminal stenosis. L4-L5: Intermediate sized left asymmetric disc bulge with moderate facet arthrosis. Mild spinal canal stenosis. Moderate left neural foraminal  stenosis. L5-S1: Severe facet arthrosis with partial disc uncovering. Mild spinal canal stenosis. No neural foraminal stenosis. Visualized sacrum: Normal. IMPRESSION: 1. No acute abnormality of the thoracic or lumbar spine. 2. Mild spinal canal stenosis and moderate left neural foraminal stenosis at L4-L5. 3. Severe facet arthrosis at L5-S1 with grade 1 anterolisthesis and mild spinal canal stenosis. 4. Unchanged thoracic and abdominal aortic aneurysms compared to 12/29/2021. Electronically Signed   By: Deatra Robinson M.D.   On: 02/15/2023 00:50       Glenyce Randle M.D. [facility] 02/16/2023, 1:29 PM  Available via Epic secure chat 7am-7pm After 7 pm, please refer to night coverage provider listed on amion.

## 2023-02-16 NOTE — Progress Notes (Signed)
PHARMACY CONSULT NOTE FOR:  OUTPATIENT  PARENTERAL ANTIBIOTIC THERAPY (OPAT)  Indication: Listeria bacteremia Regimen: Ampicillin 8 gm every 24 hours as a continuous infusion End date: 03/08/23  IV antibiotic discharge orders are pended. To discharging provider:  please sign these orders via discharge navigator,  Select New Orders & click on the button choice - Manage This Unsigned Work.     Thank you for allowing pharmacy to be a part of this patient's care.  Sharin Mons, PharmD, BCPS, BCIDP Infectious Diseases Clinical Pharmacist Phone: [phone] 02/16/2023, 11:02 AM

## 2023-02-16 NOTE — Progress Notes (Signed)
Mobility Specialist - Progress Note   02/16/23 1129  Mobility  Activity Ambulated with assistance to bathroom  Level of Assistance Standby assist, set-up cues, supervision of patient - no hands on  Assistive Device Front wheel walker  Distance Ambulated (ft) 5 ft  Activity Response Tolerated well  Mobility Referral Yes  $Mobility charge 1 Mobility  Mobility Specialist Start Time (ACUTE ONLY) 1119  Mobility Specialist Stop Time (ACUTE ONLY) 1127  Mobility Specialist Time Calculation (min) (ACUTE ONLY) 8 min   Pt received in bed requesting assistance bathroom for possible BM. Pt declined mobility due to not feeling well, stating "I feel funny". Nurse made aware. Instructed pt to pull call bell when finished. Pt verbalized understanding & wanting to walk this afternoon. Pt to bathroom with all needs met.    [facility]

## 2023-02-16 NOTE — Progress Notes (Addendum)
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 It is possible, but would be unusual for listeria to cause bone/joint infection. I do not suspect involvement. He is a smoker and sometimes referred pain from AAA can be an issue although this organism is not associated with aneurysm infection like that such as chlamydia. However, will do an ultrasound abd   Tte negative and will see if persistent bacteremia or clinical change indicate repeat w/u for IE, which at this time appear to be low risk   No sign of meningoencephalitis at this time  ------------- 02/16/23 id assessment Continues to have no sign of meningoencephalitis The back pain is significantly better by last evening after starting ampicillin/gent in the morning. Unusual but suspect related to sepsis Ultrasound continues to show stable AAA -- he had previous vascular surgery outpatient eval and not surgical candidate  Leukocytosis resolved Repeat bcx ngtd Tte no vegetation   Kidney function stable   Hiv screen sent 6/5 for health maintenance purpose  Plan: Continue amp/gent; end date gent 6/6 for 3 doses Plan 3 weeks ampicillin F/u 6/4 repeat blood cx  F/u hiv screen Anticipate if things continue to improve, ok from id standpoint to discharge by Friday with opat for ampicillin Picc can be placed tomorrow if 6/4 bcx  remains negative Id clinic f/u arranged on 7/2 @ 1115 with dr Elinor Parkinson Discussed with primary team    Principal Problem:   Acute respiratory failure with hypoxia (HCC) Active Problems:   HTN (hypertension)   Hyperlipidemia   Normocytic anemia   Pre-diabetes   Chronic obstructive pulmonary disease (HCC)   Stage 3a chronic kidney disease (HCC)   Thrombocytopenia (HCC)   AAA (abdominal aortic aneurysm) (HCC)   Listeriosis   Allergies  Allergen Reactions   Ace Inhibitors     cough    Scheduled Meds:  amLODipine  5 mg Oral BID   heparin injection (subcutaneous)  5,000 Units Subcutaneous Q8H   insulin aspart  0-9 Units Subcutaneous TID WC   polyethylene glycol  17 g Oral Daily   rosuvastatin  40 mg Oral Daily   Continuous Infusions:  ampicillin (OMNIPEN) IV 2 g (02/16/23 0947)   gentamicin Stopped (02/15/23 1423)   lactated ringers 75 mL/hr at 02/16/23 0945   PRN Meds:.acetaminophen **OR** acetaminophen, methocarbamol, ondansetron **OR** ondansetron (ZOFRAN) IV, mouth rinse, oxyCODONE   SUBJECTIVE: Feels well Back pain minimal now, all improved last evening No n/v/diarrhea No headache/confusion     Review of Systems: ROS All other ROS was negative, except mentioned above  OBJECTIVE: Vitals:   02/15/23 1322 02/15/23 2037 02/16/23 0412 02/16/23 0949  BP: 117/72 117/83 95/62 (!) 93/55  Pulse: 94 88 72   Resp: 18 18 18    Temp: 100 F (37.8 C) 99.4 F (37.4 C) 98.4 F (36.9 C)   TempSrc: Oral Oral Oral   SpO2: 98% 95% 94%   Weight:      Height:       Body mass index is 25.46 kg/m.  Physical Exam  General/constitutional: no distress, pleasant HEENT: Normocephalic, PER, Conj Clear, EOMI, Oropharynx clear Neck supple CV: rrr no mrg Lungs: clear to auscultation, normal respiratory effort Abd: Soft, Nontender Ext: no edema Skin: No Rash Neuro: nonfocal MSK: no peripheral joint swelling/tenderness/warmth; back spines nontender   Lab  Results Lab Results  Component Value Date   WBC 9.6 02/16/2023   HGB 8.8 (L) 02/16/2023   HCT 28.0 (L) 02/16/2023   MCV 94.0 02/16/2023   PLT 77 (L) 02/16/2023    Lab Results  Component Value Date   CREATININE 1.50 (H) 02/16/2023   BUN 22 02/16/2023   NA 131 (L) 02/16/2023   K 4.0 02/16/2023   CL 99 02/16/2023   CO2 24 02/16/2023    Lab Results  Component Value Date   ALT 13 02/16/2023   AST 18 02/16/2023   ALKPHOS 49 02/16/2023   BILITOT 0.5 02/16/2023      Microbiology: Recent Results (from the past 240 hour(s))  Resp panel by RT-PCR (RSV, Flu A&B, Covid) Anterior Nasal Swab     Status: None   Collection Time: 02/13/23 12:26 PM   Specimen: Anterior Nasal Swab  Result Value Ref Range Status   SARS Coronavirus 2 by RT PCR NEGATIVE NEGATIVE Final    Comment: (NOTE) SARS-CoV-2 target nucleic acids are NOT DETECTED.  The SARS-CoV-2 RNA is generally detectable in upper respiratory specimens during the acute phase of infection. The lowest concentration of SARS-CoV-2 viral copies this assay can detect is 138 copies/mL. A negative result does not preclude SARS-Cov-2 infection and should not be used as the sole basis for treatment or other patient management decisions. A negative result may occur with  improper specimen collection/handling, submission of specimen other than nasopharyngeal swab, presence of viral mutation(s) within the areas targeted by this assay, and inadequate number of viral copies(<138 copies/mL). A negative result must be combined with clinical observations, patient history, and epidemiological information. The expected result is Negative.  Fact Sheet for Patients:  BloggerCourse.com  Fact Sheet for Healthcare Providers:  SeriousBroker.it  This test is no t yet approved or cleared by the [place] FDA and  has been authorized for detection and/or diagnosis of SARS-CoV-2 by FDA under an  Emergency Use Authorization (EUA). This EUA will remain  in effect (meaning this test can be used) for the duration of the COVID-19 declaration under Section 564(b)(1) of the Act, 21 U.S.C.section 360bbb-3(b)(1), unless the authorization is terminated  or revoked sooner.       Influenza A by PCR NEGATIVE NEGATIVE Final   Influenza B by PCR NEGATIVE NEGATIVE Final    Comment: (NOTE) The Xpert Xpress SARS-CoV-2/FLU/RSV plus assay is intended as an aid in the diagnosis of influenza from Nasopharyngeal swab specimens and should not be used as a sole basis for treatment. Nasal washings and aspirates are unacceptable for Xpert Xpress SARS-CoV-2/FLU/RSV testing.  Fact Sheet for Patients: BloggerCourse.com  Fact Sheet for Healthcare Providers: SeriousBroker.it  This test is not yet approved or cleared by the [place]  FDA and has been authorized for detection and/or diagnosis of SARS-CoV-2 by FDA under an Emergency Use Authorization (EUA). This EUA will remain in effect (meaning this test can be used) for the duration of the COVID-19 declaration under Section 564(b)(1) of the Act, 21 U.S.C. section 360bbb-3(b)(1), unless the authorization is terminated or revoked.     Resp Syncytial Virus by PCR NEGATIVE NEGATIVE Final    Comment: (NOTE) Fact Sheet for Patients: BloggerCourse.com  Fact Sheet for Healthcare Providers: SeriousBroker.it  This test is not yet approved or cleared by the [place] FDA and has been authorized for detection and/or diagnosis of SARS-CoV-2 by FDA under an Emergency Use Authorization (EUA). This EUA will remain in effect (meaning this test can be used) for the duration of the COVID-19 declaration under Section 564(b)(1) of the Act, 21 U.S.C. section 360bbb-3(b)(1), unless the authorization is terminated or revoked.  Performed at [facility], [address] [place], [place] [address]   Blood Culture (routine x 2)     Status: Abnormal   Collection Time: 02/13/23 12:49 PM   Specimen: BLOOD  Result Value Ref Range Status   Specimen Description   Final    BLOOD LEFT ANTECUBITAL Performed at [facility], [address] [place], [place] [address]    Special Requests   Final    BOTTLES DRAWN AEROBIC AND ANAEROBIC Blood Culture adequate volume Performed at [facility], [address] [place], [place] [address]    Culture  Setup Time   Final    GRAM POSITIVE RODS IN BOTH AEROBIC AND ANAEROBIC BOTTLES CRITICAL RESULT CALLED TO, READ BACK BY AND VERIFIED WITH: PHARMD NICK G [id number] @1130  BY SM    Culture (A)  Final    LISTERIA MONOCYTOGENES Standardized susceptibility testing for this organism is not available. HEALTH DEPARTMENT NOTIFIED Performed at [facility], [address], [place], [place] [address]    Report Status 02/16/2023 FINAL  Final  Blood Culture (routine x 2)     Status: None (Preliminary result)   Collection Time: 02/13/23 12:55 PM   Specimen: BLOOD  Result Value Ref Range Status   Specimen Description   Final    BLOOD SITE NOT SPECIFIED Performed at [facility], [address], [place], [place] [address]    Special Requests   Final    BOTTLES DRAWN AEROBIC AND ANAEROBIC Blood Culture results may not be optimal due to an excessive volume of blood received in culture bottles Performed at [facility], [address], [place], [place] [address]    Culture  Setup Time   Final    GRAM POSITIVE RODS IN BOTH AEROBIC AND ANAEROBIC BOTTLES CRITICAL VALUE NOTED.  VALUE IS CONSISTENT WITH PREVIOUSLY REPORTED AND CALLED VALUE. Performed at [facility], [address], [place], [place] [address]    Culture GRAM POSITIVE RODS  Final   Report Status PENDING  Incomplete  Culture, blood (Routine X 2) w Reflex to  ID Panel     Status: None (Preliminary result)   Collection Time: 02/15/23 11:06 AM   Specimen: Left Antecubital; Blood  Result Value Ref Range Status   Specimen Description   Final    LEFT ANTECUBITAL BLOOD Performed at [facility], [address], [place], [place] [address]    Special Requests   Final    AEROBIC BOTTLE ONLY Blood Culture adequate volume Performed at [facility], [address] [place], [place] [address]    Culture   Final    NO GROWTH <12 HOURS Performed at [facility], [address] [place], [place] [address]    Report Status PENDING  Incomplete  Culture, blood (Routine X 2) w Reflex to ID Panel     Status: None (Preliminary result)   Collection Time: 02/15/23 11:06 AM   Specimen: BLOOD LEFT HAND  Result Value Ref Range Status   Specimen Description   Final    BLOOD LEFT HAND Performed at [facility], [address], [place], [place] [address]    Special Requests   Final    AEROBIC BOTTLE ONLY Blood Culture adequate volume Performed at [facility], [address] [place], [place] [address]    Culture   Final    NO GROWTH <12 HOURS Performed at [facility], [address], [place], [place] [address]    Report Status PENDING  Incomplete     Serology:   Imaging: If present, new imagings (plain films, ct scans, and mri) have been personally visualized and interpreted; radiology reports have been reviewed. Decision making incorporated into the Impression / Recommendations.  6/4 tte  1. Left ventricular ejection fraction, by estimation, is 55 to 60%. The left ventricle has normal function. The left ventricle has no regional wall motion abnormalities. There is moderate concentric left ventricular hypertrophy. Left ventricular  diastolic parameters were normal.   2. Right ventricular systolic function is normal. The right ventricular size is normal.   3. The mitral valve is  grossly normal. No evidence of mitral valve regurgitation. No evidence of mitral stenosis.   4. The aortic valve is grossly normal. There is mild calcification of the aortic valve. There is mild thickening of the aortic valve. Aortic valve regurgitation is mild. No aortic stenosis is present.   5. There is borderline dilatation of the ascending aorta, measuring 41 mm.   6. The inferior vena cava is normal in size with greater than 50% respiratory variability, suggesting right atrial pressure of 3 mmHg.   7. Technically difficult study; there is mild AI with thickening of the aortic valve. Consider TEE for further evaluation if clinically indicated.      6/3 mri thoracic and lumbar spine 1. No acute abnormality of the thoracic or lumbar spine. 2. Mild spinal canal stenosis and moderate left neural foraminal stenosis at L4-L5. 3. Severe facet arthrosis at L5-S1 with grade 1 anterolisthesis and mild spinal canal stenosis. 4. Unchanged thoracic and abdominal aortic aneurysms compared to 12/29/2021.   6/2 cxr 1. Stable radiographic contour of very large Descending Thoracic Aortic Aneurysm demonstrated by CTA in April (approximately 6.2 cm at that time). 2. No new cardiopulmonary abnormality identified.   Raymondo Band, MD [facility] [phone] pager    02/16/2023, 10:53 AM

## 2023-02-17 ENCOUNTER — Inpatient Hospital Stay (HOSPITAL_COMMUNITY): Payer: Medicare HMO

## 2023-02-17 DIAGNOSIS — A329 Listeriosis, unspecified: Secondary | ICD-10-CM | POA: Diagnosis not present

## 2023-02-17 DIAGNOSIS — N1831 Chronic kidney disease, stage 3a: Secondary | ICD-10-CM | POA: Diagnosis not present

## 2023-02-17 DIAGNOSIS — J410 Simple chronic bronchitis: Secondary | ICD-10-CM | POA: Diagnosis not present

## 2023-02-17 DIAGNOSIS — N179 Acute kidney failure, unspecified: Secondary | ICD-10-CM | POA: Diagnosis not present

## 2023-02-17 DIAGNOSIS — J9601 Acute respiratory failure with hypoxia: Secondary | ICD-10-CM | POA: Diagnosis not present

## 2023-02-17 LAB — GLUCOSE, CAPILLARY
Glucose-Capillary: 152 mg/dL — ABNORMAL HIGH (ref 70–99)
Glucose-Capillary: 143 mg/dL — ABNORMAL HIGH (ref 70–99)
Glucose-Capillary: 142 mg/dL — ABNORMAL HIGH (ref 70–99)
Glucose-Capillary: 137 mg/dL — ABNORMAL HIGH (ref 70–99)
Glucose-Capillary: 152 mg/dL — ABNORMAL HIGH (ref 70–99)

## 2023-02-17 LAB — BASIC METABOLIC PANEL
Anion gap: 9 (ref 5–15)
BUN: 16 mg/dL (ref 8–23)
CO2: 27 mmol/L (ref 22–32)
Calcium: 8.4 mg/dL — ABNORMAL LOW (ref 8.9–10.3)
Chloride: 97 mmol/L — ABNORMAL LOW (ref 98–111)
Creatinine, Ser: 1.31 mg/dL — ABNORMAL HIGH (ref 0.61–1.24)
GFR, Estimated: 54 mL/min — ABNORMAL LOW (ref >60)
Glucose, Bld: 164 mg/dL — ABNORMAL HIGH (ref 70–99)
Potassium: 3.8 mmol/L (ref 3.5–5.1)
Sodium: 133 mmol/L — ABNORMAL LOW (ref 135–145)

## 2023-02-17 LAB — CBC
HCT: 30.4 % — ABNORMAL LOW (ref 39.0–52.0)
Hemoglobin: 9.8 g/dL — ABNORMAL LOW (ref 13.0–17.0)
MCH: 30 pg (ref 26.0–34.0)
MCHC: 32.2 g/dL (ref 30.0–36.0)
MCV: 93 fL (ref 80.0–100.0)
Platelets: 97 10*3/uL — ABNORMAL LOW (ref 150–400)
RBC: 3.27 MIL/uL — ABNORMAL LOW (ref 4.22–5.81)
RDW: 14.4 % (ref 11.5–15.5)
WBC: 7.2 10*3/uL (ref 4.0–10.5)
nRBC: 0 % (ref 0.0–0.2)

## 2023-02-17 LAB — HIV ANTIBODY (ROUTINE TESTING W REFLEX): HIV Screen 4th Generation wRfx: NONREACTIVE

## 2023-02-17 LAB — BRAIN NATRIURETIC PEPTIDE: B Natriuretic Peptide: 303.3 pg/mL — ABNORMAL HIGH (ref 0.0–100.0)

## 2023-02-17 LAB — D-DIMER, QUANTITATIVE: D-Dimer, Quant: 8.51 ug/mL-FEU — ABNORMAL HIGH (ref 0.00–0.50)

## 2023-02-17 MED ORDER — BISACODYL 5 MG PO TBEC
5.0000 mg | DELAYED_RELEASE_TABLET | Freq: Once | ORAL | Status: AC
  Administered 2023-02-17: 5 mg via ORAL
  Filled 2023-02-17: qty 1

## 2023-02-17 MED ORDER — POLYETHYLENE GLYCOL 3350 17 G PO PACK
17.0000 g | PACK | Freq: Two times a day (BID) | ORAL | Status: DC
  Administered 2023-02-17 – 2023-02-18 (×2): 17 g via ORAL
  Filled 2023-02-17 (×2): qty 1

## 2023-02-17 MED ORDER — CHLORHEXIDINE GLUCONATE CLOTH 2 % EX PADS
6.0000 | MEDICATED_PAD | Freq: Every day | CUTANEOUS | Status: DC
  Administered 2023-02-17 – 2023-02-22 (×6): 6 via TOPICAL

## 2023-02-17 MED ORDER — FLEET ENEMA 7-19 GM/118ML RE ENEM
1.0000 | ENEMA | Freq: Once | RECTAL | Status: AC
  Administered 2023-02-17: 1 via RECTAL
  Filled 2023-02-17: qty 1

## 2023-02-17 MED ORDER — FUROSEMIDE 10 MG/ML IJ SOLN
20.0000 mg | Freq: Once | INTRAMUSCULAR | Status: AC
  Administered 2023-02-17: 20 mg via INTRAVENOUS
  Filled 2023-02-17: qty 2

## 2023-02-17 MED ORDER — IOHEXOL 350 MG/ML SOLN
75.0000 mL | Freq: Once | INTRAVENOUS | Status: AC | PRN
  Administered 2023-02-17: 75 mL via INTRAVENOUS

## 2023-02-17 MED ORDER — MELATONIN 5 MG PO TABS
5.0000 mg | ORAL_TABLET | Freq: Every evening | ORAL | Status: AC | PRN
  Administered 2023-02-18 (×2): 5 mg via ORAL
  Filled 2023-02-17 (×2): qty 1

## 2023-02-17 MED ORDER — DOCUSATE SODIUM 100 MG PO CAPS
100.0000 mg | ORAL_CAPSULE | Freq: Two times a day (BID) | ORAL | Status: DC
  Administered 2023-02-17 – 2023-02-18 (×2): 100 mg via ORAL
  Filled 2023-02-17 (×2): qty 1

## 2023-02-17 NOTE — Progress Notes (Signed)
Mobility Specialist - Progress Note   02/17/23 1123  Mobility  Activity Ambulated with assistance in hallway  Level of Assistance Standby assist, set-up cues, supervision of patient - no hands on  Assistive Device Front wheel walker  Distance Ambulated (ft) 200 ft  Range of Motion/Exercises Active  Activity Response Tolerated well  Mobility Referral Yes  $Mobility charge 1 Mobility  Mobility Specialist Start Time (ACUTE ONLY) 1114  Mobility Specialist Stop Time (ACUTE ONLY) 1123  Mobility Specialist Time Calculation (min) (ACUTE ONLY) 9 min   Pt was found in bed and agreeable to ambulate. No complaints during session and at EOS returned to bed with necessities in reach. Wife in room and RN notified of session.  Billey Chang Mobility Specialist

## 2023-02-17 NOTE — Progress Notes (Signed)
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 It is possible, but would be unusual for listeria to cause bone/joint infection. I do not suspect involvement. He is a smoker and sometimes referred pain from AAA can be an issue although this organism is not associated with aneurysm infection like that such as chlamydia. However, will do an ultrasound abd   Tte negative and will see if persistent bacteremia or clinical change indicate repeat w/u for IE, which at this time appear to be low risk   No sign of meningoencephalitis at this time  ------------- 02/16/23 id assessment Continues to have no sign of meningoencephalitis The back pain is significantly better by last evening after starting ampicillin/gent in the morning. Unusual but suspect related to sepsis Ultrasound continues to show stable AAA -- he had previous vascular surgery outpatient eval and not surgical candidate  Leukocytosis resolved Repeat bcx ngtd Tte no vegetation   Kidney function stable   -------- 6/6 assessment Hiv screen negative Appears almost at baseline health 6/4 repeat bcx negative Aki resolved  Plan: Finish gent last dose today; continue ampicillin for the 3week course until 6/25 Picc ordered Opat as below Id clinic f/u arranged on 7/2 @ 1115 with dr Elinor Parkinson Will sign off Discussed with primary  team   OPAT Orders Discharge antibiotics to be given via PICC line Discharge antibiotics: Ampicillin  Duration: 3 weeks End Date: 6/25 OPAT Order Details             .Outpatient Parenteral Antibiotic Therapy Consult  Until discontinued       Provider:  (Not yet assigned)  Question Answer Comment  Antibiotic Ampicillin   Indications for use Listeria bacteremia   End Date 03/08/2023   Approving ID Provider's Name Saiquan Hands                  [facility] Care Per Protocol:  Home health RN for IV administration and teaching; PICC line care and labs.    Labs weekly while on IV antibiotics: _x_ CBC with differential __ BMP _x_ CMP _x_ CRP __ ESR __ Vancomycin trough __ CK  _x_ Please pull PIC at completion of IV antibiotics __ Please leave PIC in place until doctor has seen patient or been notified  Fax weekly labs to [phone]  Clinic Follow Up Appt: See above  @  [facility] [address], [place], [place] [address] Phone: [phone]       Principal Problem:   Acute respiratory failure with hypoxia (HCC) Active Problems:   HTN (hypertension)  Hyperlipidemia   Normocytic anemia   Pre-diabetes   Chronic obstructive pulmonary disease (HCC)   Stage 3a chronic kidney disease (HCC)   Thrombocytopenia (HCC)   AAA (abdominal aortic aneurysm) (HCC)   Listeriosis   Allergies  Allergen Reactions   Ace Inhibitors     cough    Scheduled Meds:  amLODipine  5 mg Oral BID   heparin injection (subcutaneous)  5,000 Units Subcutaneous Q8H   insulin aspart  0-9 Units Subcutaneous TID WC   polyethylene glycol  17 g Oral Daily   rosuvastatin  40 mg Oral Daily   Continuous Infusions:  ampicillin (OMNIPEN) IV 2 g (02/17/23 0423)   gentamicin Stopped (02/16/23 2230)   lactated ringers 75 mL/hr at 02/16/23 2342   PRN Meds:.acetaminophen **OR** acetaminophen, methocarbamol, ondansetron **OR** ondansetron (ZOFRAN) IV, mouth rinse,  oxyCODONE   SUBJECTIVE: No complaint     Review of Systems: ROS All other ROS was negative, except mentioned above     OBJECTIVE: Vitals:   02/16/23 0949 02/16/23 1202 02/16/23 2059 02/17/23 0554  BP: (!) 93/55 106/71 111/68 99/68  Pulse:  81 86 79  Resp:  18 18 18   Temp:  98.3 F (36.8 C) 97.9 F (36.6 C) 98.3 F (36.8 C)  TempSrc:  Oral Oral Oral  SpO2:  95% 95% 97%  Weight:      Height:       Body mass index is 25.46 kg/m.  Physical Exam General/constitutional: no distress, pleasant HEENT: Normocephalic, PER, Conj Clear, EOMI, Oropharynx clear Neck supple CV: rrr no mrg Lungs: clear to auscultation, normal respiratory effort Abd: Soft, Nontender Ext: no edema Skin: No Rash Neuro: nonfocal MSK: no peripheral joint swelling/tenderness/warmth; back spines nontender    Lab Results Lab Results  Component Value Date   WBC 7.2 02/17/2023   HGB 9.8 (L) 02/17/2023   HCT 30.4 (L) 02/17/2023   MCV 93.0 02/17/2023   PLT 97 (L) 02/17/2023    Lab Results  Component Value Date   CREATININE 1.31 (H) 02/17/2023   BUN 16 02/17/2023   NA 133 (L) 02/17/2023   K 3.8 02/17/2023   CL 97 (L) 02/17/2023   CO2 27 02/17/2023    Lab Results  Component Value Date   ALT 13 02/16/2023   AST 18 02/16/2023   ALKPHOS 49 02/16/2023   BILITOT 0.5 02/16/2023      Microbiology: Recent Results (from the past 240 hour(s))  Resp panel by RT-PCR (RSV, Flu A&B, Covid) Anterior Nasal Swab     Status: None   Collection Time: 02/13/23 12:26 PM   Specimen: Anterior Nasal Swab  Result Value Ref Range Status   SARS Coronavirus 2 by RT PCR NEGATIVE NEGATIVE Final    Comment: (NOTE) SARS-CoV-2 target nucleic acids are NOT DETECTED.  The SARS-CoV-2 RNA is generally detectable in upper respiratory specimens during the acute phase of infection. The lowest concentration of SARS-CoV-2 viral copies this assay can detect is 138 copies/mL. A negative result does not preclude  SARS-Cov-2 infection and should not be used as the sole basis for treatment or other patient management decisions. A negative result may occur with  improper specimen collection/handling, submission of specimen other than nasopharyngeal swab, presence of viral mutation(s) within the areas targeted by this assay, and inadequate number of viral copies(<138 copies/mL). A negative result must be combined with clinical observations, patient history, and epidemiological information. The expected result is Negative.  Fact Sheet for Patients:  BloggerCourse.com  Fact Sheet for Healthcare Providers:  SeriousBroker.it  This test is no t yet approved or cleared by the [place] and  has been authorized for detection and/or diagnosis of SARS-CoV-2 by FDA under an Emergency Use Authorization (EUA). This EUA will remain  in effect (meaning this test can be used) for the duration of the COVID-19 declaration under Section 564(b)(1) of the Act, 21 U.S.C.section 360bbb-3(b)(1), unless the authorization is terminated  or revoked sooner.       Influenza A by PCR NEGATIVE NEGATIVE Final   Influenza B by PCR NEGATIVE NEGATIVE Final    Comment: (NOTE) The Xpert Xpress SARS-CoV-2/FLU/RSV plus assay is intended as an aid in the diagnosis of influenza from Nasopharyngeal swab specimens and should not be used as a sole basis for treatment. Nasal washings and aspirates are unacceptable for Xpert Xpress SARS-CoV-2/FLU/RSV testing.  Fact Sheet for Patients: BloggerCourse.com  Fact Sheet for Healthcare Providers: SeriousBroker.it  This test is not yet approved or cleared by the [place] FDA and has been authorized for detection and/or diagnosis of SARS-CoV-2 by FDA under an Emergency Use Authorization (EUA). This EUA will remain in effect (meaning this test can be used) for the duration of  the COVID-19 declaration under Section 564(b)(1) of the Act, 21 U.S.C. section 360bbb-3(b)(1), unless the authorization is terminated or revoked.     Resp Syncytial Virus by PCR NEGATIVE NEGATIVE Final    Comment: (NOTE) Fact Sheet for Patients: BloggerCourse.com  Fact Sheet for Healthcare Providers: SeriousBroker.it  This test is not yet approved or cleared by the [place] FDA and has been authorized for detection and/or diagnosis of SARS-CoV-2 by FDA under an Emergency Use Authorization (EUA). This EUA will remain in effect (meaning this test can be used) for the duration of the COVID-19 declaration under Section 564(b)(1) of the Act, 21 U.S.C. section 360bbb-3(b)(1), unless the authorization is terminated or revoked.  Performed at [facility], [address] [place], [place] [address]   Blood Culture (routine x 2)     Status: Abnormal   Collection Time: 02/13/23 12:49 PM   Specimen: BLOOD  Result Value Ref Range Status   Specimen Description   Final    BLOOD LEFT ANTECUBITAL Performed at [facility], [address], [place], [place] [address]    Special Requests   Final    BOTTLES DRAWN AEROBIC AND ANAEROBIC Blood Culture adequate volume Performed at [facility], [address], [place], [place] [address]    Culture  Setup Time   Final    GRAM POSITIVE RODS IN BOTH AEROBIC AND ANAEROBIC BOTTLES CRITICAL RESULT CALLED TO, READ BACK BY AND VERIFIED WITH: PHARMD NICK G [id number] @1130  BY SM    Culture (A)  Final    LISTERIA MONOCYTOGENES Standardized susceptibility testing for this organism is not available. HEALTH DEPARTMENT NOTIFIED Performed at [facility], [address], [place], [place] [address]    Report Status 02/16/2023 FINAL  Final  Blood Culture (routine x 2)     Status: Abnormal   Collection Time: 02/13/23 12:55 PM   Specimen: BLOOD   Result Value Ref Range Status   Specimen Description   Final    BLOOD SITE NOT SPECIFIED Performed at [facility], [address] [place], [place] [address]    Special Requests   Final    BOTTLES DRAWN AEROBIC AND ANAEROBIC Blood Culture results may not be optimal due to an excessive volume of blood received in culture bottles  Performed at [facility], [address], [place], [place] [address]    Culture  Setup Time   Final    GRAM POSITIVE RODS IN BOTH AEROBIC AND ANAEROBIC BOTTLES CRITICAL VALUE NOTED.  VALUE IS CONSISTENT WITH PREVIOUSLY REPORTED AND CALLED VALUE.    Culture (A)  Final    LISTERIA MONOCYTOGENES Standardized susceptibility testing for this organism is not available. HEALTH DEPARTMENT NOTIFIED Performed at [facility], [address] [place], [place] [address]    Report Status 02/16/2023 FINAL  Final  Culture, blood (Routine X 2) w Reflex to ID Panel     Status: None (Preliminary result)   Collection Time: 02/15/23 11:06 AM   Specimen: Left Antecubital; Blood  Result Value Ref Range Status   Specimen Description   Final    LEFT ANTECUBITAL BLOOD Performed at [facility], [address] [place], [place] [address]    Special Requests   Final    AEROBIC BOTTLE ONLY Blood Culture adequate volume Performed at [facility], [address], [place], [place] [address]    Culture   Final    NO GROWTH 2 DAYS Performed at [facility], [address] [place], [place] [address]    Report Status PENDING  Incomplete  Culture, blood (Routine X 2) w Reflex to ID Panel     Status: None (Preliminary result)   Collection Time: 02/15/23 11:06 AM   Specimen: BLOOD LEFT HAND  Result Value Ref Range Status   Specimen Description   Final    BLOOD LEFT HAND Performed at [facility], [address], [place], [place] [address]    Special Requests   Final    AEROBIC BOTTLE  ONLY Blood Culture adequate volume Performed at [facility], [address], [place], [place] [address]    Culture   Final    NO GROWTH 2 DAYS Performed at [facility], [address] [place], [place] [address]    Report Status PENDING  Incomplete     Serology:   Imaging: If present, new imagings (plain films, ct scans, and mri) have been personally visualized and interpreted; radiology reports have been reviewed. Decision making incorporated into the Impression / Recommendations.  6/4 tte  1. Left ventricular ejection fraction, by estimation, is 55 to 60%. The left ventricle has normal function. The left ventricle has no regional wall motion abnormalities. There is moderate concentric left ventricular hypertrophy. Left ventricular  diastolic parameters were normal.   2. Right ventricular systolic function is normal. The right ventricular size is normal.   3. The mitral valve is grossly normal. No evidence of mitral valve regurgitation. No evidence of mitral stenosis.   4. The aortic valve is grossly normal. There is mild calcification of the aortic valve. There is mild thickening of the aortic valve. Aortic valve regurgitation is mild. No aortic stenosis is present.   5. There is borderline dilatation of the ascending aorta, measuring 41 mm.   6. The inferior vena cava is normal in size with greater than 50% respiratory variability, suggesting right atrial pressure of 3 mmHg.   7. Technically difficult study; there is mild AI with thickening of the aortic valve. Consider TEE for further evaluation if clinically indicated.      6/3 mri thoracic and lumbar spine 1. No acute abnormality of the thoracic or lumbar spine. 2. Mild spinal canal stenosis and moderate left neural foraminal stenosis  at L4-L5. 3. Severe facet arthrosis at L5-S1 with grade 1 anterolisthesis and mild spinal canal stenosis. 4. Unchanged thoracic and abdominal aortic aneurysms compared  to 12/29/2021.   6/2 cxr 1. Stable radiographic contour of very large Descending Thoracic Aortic Aneurysm demonstrated by CTA in April (approximately 6.2 cm at that time). 2. No new cardiopulmonary abnormality identified.   Raymondo Band, MD [facility] [phone] pager    02/17/2023, 10:24 AM

## 2023-02-17 NOTE — Progress Notes (Signed)
Peripherally Inserted Central Catheter Placement  The IV Nurse has discussed with the patient and/or persons authorized to consent for the patient, the purpose of this procedure and the potential benefits and risks involved with this procedure.  The benefits include less needle sticks, lab draws from the catheter, and the patient may be discharged home with the catheter. Risks include, but not limited to, infection, bleeding, blood clot (thrombus formation), and puncture of an artery; nerve damage and irregular heartbeat and possibility to perform a PICC exchange if needed/ordered by physician.  Alternatives to this procedure were also discussed.  Bard Power PICC patient education guide, fact sheet on infection prevention and patient information card has been provided to patient /or left at bedside.    PICC Placement Documentation  PICC Single Lumen 02/17/23 Right Brachial 40 cm 0 cm (Active)  Indication for Insertion or Continuance of Line Home intravenous therapies (PICC only) 02/17/23 1245  Exposed Catheter (cm) 0 cm 02/17/23 1245  Site Assessment Clean, Dry, Intact 02/17/23 1245  Line Status Flushed;Saline locked;Blood return noted 02/17/23 1245  Dressing Type Transparent;Securing device 02/17/23 1245  Dressing Status Antimicrobial disc in place 02/17/23 1245  Safety Lock Not Applicable 02/17/23 1245  Line Care Connections checked and tightened 02/17/23 1245  Line Adjustment (NICU/IV Team Only) No 02/17/23 1245  Dressing Intervention New dressing 02/17/23 1245  Dressing Change Due 02/24/23 02/17/23 1245       Vernona Rieger  Sharon Stapel 02/17/2023, 12:58 PM

## 2023-02-17 NOTE — Progress Notes (Signed)
Gave pt juice mix: apple, prune, & orange. Made nauseated & small emesis <100 ml out Administered enema ordered, with pt on left side. Currently attempting to have BM, after multiple attempts this morning. Successful with small BM.  1230 pt had some dyspnea & O2 sats 87% RA @ rest. Placed on 1 L, pt increased to 93%. Notified MD this afternoon of event. CXR ordered & labs

## 2023-02-17 NOTE — Progress Notes (Addendum)
[facility]                                                                              [name], is a 87 y.o. male, DOB - [birth year], ZOX:[id number] Admit date - 02/13/2023    Outpatient Primary MD for the patient is Jimmey Ralph, Katina Degree, MD  LOS - 4  days  Chief Complaint  Patient presents with   Fever       Brief summary   Patient is a 87 y.o. male with a history of CVA, T2DM, HTN, HLD, stage IIIa CKD, BPH, anemia, thrombocytopenia, OA, and AAA managed medically due to prohibitive surgical risk who presented to the ED on 02/13/2023 with fever and chills, fatigue. He's had no recent changes in symptoms, but has had nonproductive cough for many weeks. Initially afebrile and tachycardic with modest hypoxemia requiring 2L O2. Rare bacteriuria noted on UA. WBC 18.6k. Despite reassuring CXR, given the patient's fever, leukocytosis, hypoxia, and cough, antibiotics to treat for pneumonia were given and patient was admitted.  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  ID consulted.  Assessment & Plan    Principal Problem:   Acute respiratory failure with hypoxia (HCC), due to COPD -Somewhat more short of breath and weak today -I's and O's with 5.3 L positive, will DC IV fluids -Obtain BNP, D-dimer, chest x-ray, will likely need a dose of IV Lasix Addendum: 4:20pm D-Dimer 8.51, will obtain stat CTA chest, LE doppler  BNP 303, will give lasix 20mg  IV x1 CXR reviewed   Active Problems:  Listeria bacteremia:  -2D echo showed EF of 55 to 60%, moderate LVH, normal right ventricular systolic function, no vegetations -- MRI T and L spine due to acute onset back pain with fever, did not show evidence of infection.  -PICC line pending today -ID clinic arranged on 03/15/2023 at 11:15 with Dr. 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 Has followed up with [facility] vascular surgery, last available note is July 2023 at which point the recommended follow up only if he wished for surgery.  -Abdominal ultrasound showed AAA 5.9 cm, aneurysmal dilatation of both common iliac arteries, most on the left dilated to 6 cm.  Discussed with patient and wife, patient is followed at [facility] for the same.     AKI on stage IIIa CKD: -  Improving Cr 1.7 > 1.5 with baseline 1.2-1.4.  -Creatinine improving, back to baseline  Generalized debility -PT OT evaluation    Hyponatremia: -Stable, asymptomatic   Hyperbilirubinemia: -Resolved    Normocytic anemia, thrombocytopenia:  -H&H stable, counts improving   HLD:  - Continue rosuvastatin   Prediabetes:  - SSI  Constipation Fleet enema given today with not much output Increase MiraLAX to twice daily, add Dulcolax 5 mg p.o. x 1, Colace 100 mg twice daily -Repeat enema if no BM by tomorrow am   Estimated body mass index  is 25.46 kg/m as calculated from the following:   Height as of this encounter: 5\' 5"  (1.651 m).   Weight as of this encounter: 69.4 kg.   NO CPR: Pt would wish for ACLS medications/AED and ventilatory support in the event of cardiac/respiratory arrest, but would not want CPR.   Code Status: Full code but no CPR DVT Prophylaxis:  heparin injection 5,000 Units Start: 02/15/23 1400 SCDs Start: 02/13/23 1557   Level of Care: Level of care: Progressive Family Communication: Updated patient's wife at the bedside Disposition Plan:      Remains inpatient appropriate: Hopefully home with PICC line tomorrow    Procedures:    Consultants:   Infectious disease  Antimicrobials:   Anti-infectives (From admission, onward)    Start     Dose/Rate Route Frequency Ordered Stop   02/16/23 0000  ampicillin IVPB        8 g Intravenous Every 24 hours 02/16/23 1403 03/08/23  2359   02/15/23 1200  gentamicin (GARAMYCIN) IVPB 100 mg        100 mg 200 mL/hr over 30 Minutes Intravenous Every 24 hours 02/15/23 0913 02/17/23 1456   02/15/23 0930  ampicillin (OMNIPEN) 2 g in sodium chloride 0.9 % 100 mL IVPB        2 g 300 mL/hr over 20 Minutes Intravenous Every 6 hours 02/15/23 0842     02/15/23 0900  vancomycin (VANCOREADY) IVPB 1250 mg/250 mL  Status:  Discontinued        1,250 mg 166.7 mL/hr over 90 Minutes Intravenous  Once 02/15/23 0800 02/15/23 0810   02/15/23 0900  Ampicillin-Sulbactam (UNASYN) 3 g in sodium chloride 0.9 % 100 mL IVPB  Status:  Discontinued        3 g 200 mL/hr over 30 Minutes Intravenous Every 6 hours 02/15/23 0801 02/15/23 0842   02/15/23 0900  vancomycin (VANCOREADY) IVPB 1500 mg/300 mL  Status:  Discontinued        1,500 mg 150 mL/hr over 120 Minutes Intravenous Every 48 hours 02/15/23 0811 02/15/23 0842   02/15/23 0845  metroNIDAZOLE (FLAGYL) IVPB 500 mg  Status:  Discontinued        500 mg 100 mL/hr over 60 Minutes Intravenous Every 12 hours 02/15/23 0747 02/15/23 0755   02/13/23 1330  cefTRIAXone (ROCEPHIN) 2 g in sodium chloride 0.9 % 100 mL IVPB  Status:  Discontinued        2 g 200 mL/hr over 30 Minutes Intravenous Every 24 hours 02/13/23 1317 02/15/23 0747   02/13/23 1330  azithromycin (ZITHROMAX) 500 mg in sodium chloride 0.9 % 250 mL IVPB  Status:  Discontinued        500 mg 250 mL/hr over 60 Minutes Intravenous Every 24 hours 02/13/23 1317 02/15/23 0747          Medications  amLODipine  5 mg Oral BID   Chlorhexidine Gluconate Cloth  6 each Topical Daily   heparin injection (subcutaneous)  5,000 Units Subcutaneous Q8H   insulin aspart  0-9 Units Subcutaneous TID WC   polyethylene glycol  17 g Oral Daily   rosuvastatin  40 mg Oral Daily      Subjective:   [name] was seen and examined today.  Not feeling too good today.  Wife at the bedside, states did not sleep well last night, feeling very weak and  constipated today.  No chest pain, dizziness, abdominal pain, nausea or vomiting.  Objective:   Vitals:   02/16/23 2059 02/17/23 1610  02/17/23 1357 02/17/23 1400  BP: 111/68 99/68 111/76   Pulse: 86 79 81   Resp: 18 18 18    Temp: 97.9 F (36.6 C) 98.3 F (36.8 C) 97.8 F (36.6 C)   TempSrc: Oral Oral Oral   SpO2: 95% 97% (!) 85% 92%  Weight:      Height:        Intake/Output Summary (Last 24 hours) at 02/17/2023 1501 Last data filed at 02/17/2023 1423 Gross per 24 hour  Intake 2818.54 ml  Output --  Net 2818.54 ml     Wt Readings from Last 3 Encounters:  02/13/23 69.4 kg  01/20/23 74.2 kg  12/30/22 73.5 kg   Physical Exam General: Alert and oriented x 3, feeling uncomfortable Cardiovascular: S1 S2 clear, RRR.  Respiratory: Diminished breath sound at the bases Gastrointestinal: Soft, nontender, nondistended, NBS Ext: no pedal edema bilaterally Neuro: no new deficits Psych: Normal affect    Data Reviewed:  I have personally reviewed following labs    CBC Lab Results  Component Value Date   WBC 7.2 02/17/2023   RBC 3.27 (L) 02/17/2023   HGB 9.8 (L) 02/17/2023   HCT 30.4 (L) 02/17/2023   MCV 93.0 02/17/2023   MCH 30.0 02/17/2023   PLT 97 (L) 02/17/2023   MCHC 32.2 02/17/2023   RDW 14.4 02/17/2023   LYMPHSABS 0.2 (L) 02/13/2023   MONOABS 1.0 02/13/2023   EOSABS 0.0 02/13/2023   BASOSABS 0.0 02/13/2023     Last metabolic panel Lab Results  Component Value Date   NA 133 (L) 02/17/2023   K 3.8 02/17/2023   CL 97 (L) 02/17/2023   CO2 27 02/17/2023   BUN 16 02/17/2023   CREATININE 1.31 (H) 02/17/2023   GLUCOSE 164 (H) 02/17/2023   GFRNONAA 54 (L) 02/17/2023   GFRAA 70 08/15/2020   CALCIUM 8.4 (L) 02/17/2023   PROT 5.9 (L) 02/16/2023   ALBUMIN 2.6 (L) 02/16/2023   BILITOT 0.5 02/16/2023   ALKPHOS 49 02/16/2023   AST 18 02/16/2023   ALT 13 02/16/2023   ANIONGAP 9 02/17/2023    CBG (last 3)  Recent Labs    02/17/23 0102 02/17/23 0731  02/17/23 1126  GLUCAP 152* 143* 142*      Coagulation Profile: Recent Labs  Lab 02/13/23 1249  INR 1.4*     Radiology Studies: I have personally reviewed the imaging studies  [place] EKG SITE RITE  Result Date: 02/16/2023 If Site Rite image not attached, placement could not be confirmed due to current cardiac rhythm.  US AORTA  Result Date: 02/15/2023 CLINICAL DATA:  Abdominal pain EXAM: ULTRASOUND OF ABDOMINAL AORTA TECHNIQUE: Ultrasound examination of the abdominal aorta and proximal common iliac arteries was performed to evaluate for aneurysm. Additional color and Doppler images of the distal aorta were obtained to document patency. COMPARISON:  MRI from the previous day. FINDINGS: Abdominal aortic measurements as follows: Proximal:  5.9 cm Mid:  5.8 cm Distal:  2.9 cm Patent: Yes, peak systolic velocity is 57.7 cm/s Right common iliac artery: 3.8 cm Left common iliac artery: 6.0 cm IMPRESSION: Abdominal aortic aneurysm measuring up to 5.9 cm. 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 Recommend referral to a vascular specialist. Additionally CTA of the chest, abdomen and pelvis would be helpful for more dedicated evaluation. This recommendation follows ACR consensus guidelines: White Paper of the ACR Incidental Findings Committee II on Vascular Findings. J Am Coll Radiol 2013; 10:789-794.  These results will be called to the ordering clinician or representative by the Radiologist Assistant, and communication documented in the PACS or [employer]. Electronically Signed   By: Alcide Clever M.D.   On: 02/15/2023 20:41       [name] M.D. [facility] 02/17/2023, 3:01 PM  Available via Epic secure chat 7am-7pm After 7 pm, please refer to night coverage provider listed on amion.

## 2023-02-17 NOTE — Progress Notes (Signed)
Mobility Specialist - Progress Note   02/17/23 1557  Mobility  Activity Ambulated with assistance to bathroom  Level of Assistance Contact guard assist, steadying assist  Assistive Device  (HHA)  Distance Ambulated (ft) 10 ft  Range of Motion/Exercises Active  Activity Response Tolerated fair  Mobility Referral Yes  $Mobility charge 1 Mobility  Mobility Specialist Start Time (ACUTE ONLY) 1542  Mobility Specialist Stop Time (ACUTE ONLY) 1550  Mobility Specialist Time Calculation (min) (ACUTE ONLY) 8 min   Pt was found in bed wanting assistance to the bathroom. Pt able to ambulate to the bathroom by holding on to objects for stability. Pt unable to ambulate in hallway at this time stating he felt "shaky". At EOS returned to bed with necessities in reach and wife in room. RN notified.  Billey Chang Mobility Specialist

## 2023-02-17 NOTE — Care Management Important Message (Signed)
Important Message  Patient Details IM Letter given. Name: [name] MRN: [id number] Date of Birth: 1936/01/28   Medicare Important Message Given:  Yes     Caren Macadam 02/17/2023, 11:12 AM

## 2023-02-18 ENCOUNTER — Inpatient Hospital Stay (HOSPITAL_COMMUNITY): Payer: Medicare HMO

## 2023-02-18 DIAGNOSIS — R609 Edema, unspecified: Secondary | ICD-10-CM

## 2023-02-18 DIAGNOSIS — J9601 Acute respiratory failure with hypoxia: Secondary | ICD-10-CM | POA: Diagnosis not present

## 2023-02-18 DIAGNOSIS — A329 Listeriosis, unspecified: Secondary | ICD-10-CM | POA: Diagnosis not present

## 2023-02-18 DIAGNOSIS — N179 Acute kidney failure, unspecified: Secondary | ICD-10-CM | POA: Diagnosis not present

## 2023-02-18 LAB — GLUCOSE, CAPILLARY
Glucose-Capillary: 96 mg/dL (ref 70–99)
Glucose-Capillary: 155 mg/dL — ABNORMAL HIGH (ref 70–99)
Glucose-Capillary: 133 mg/dL — ABNORMAL HIGH (ref 70–99)
Glucose-Capillary: 162 mg/dL — ABNORMAL HIGH (ref 70–99)

## 2023-02-18 LAB — BASIC METABOLIC PANEL
Anion gap: 11 (ref 5–15)
BUN: 15 mg/dL (ref 8–23)
CO2: 28 mmol/L (ref 22–32)
Calcium: 8.4 mg/dL — ABNORMAL LOW (ref 8.9–10.3)
Chloride: 95 mmol/L — ABNORMAL LOW (ref 98–111)
Creatinine, Ser: 1.5 mg/dL — ABNORMAL HIGH (ref 0.61–1.24)
GFR, Estimated: 46 mL/min — ABNORMAL LOW (ref >60)
Glucose, Bld: 120 mg/dL — ABNORMAL HIGH (ref 70–99)
Potassium: 3.9 mmol/L (ref 3.5–5.1)
Sodium: 134 mmol/L — ABNORMAL LOW (ref 135–145)

## 2023-02-18 LAB — CBC
HCT: 29 % — ABNORMAL LOW (ref 39.0–52.0)
Hemoglobin: 9.5 g/dL — ABNORMAL LOW (ref 13.0–17.0)
MCH: 30.4 pg (ref 26.0–34.0)
MCHC: 32.8 g/dL (ref 30.0–36.0)
MCV: 92.7 fL (ref 80.0–100.0)
Platelets: 95 10*3/uL — ABNORMAL LOW (ref 150–400)
RBC: 3.13 MIL/uL — ABNORMAL LOW (ref 4.22–5.81)
RDW: 14.4 % (ref 11.5–15.5)
WBC: 5.2 10*3/uL (ref 4.0–10.5)
nRBC: 0 % (ref 0.0–0.2)

## 2023-02-18 LAB — CULTURE, BLOOD (ROUTINE X 2)
Culture: NO GROWTH
Culture: NO GROWTH

## 2023-02-18 MED ORDER — LIDOCAINE 5 % EX PTCH
1.0000 | MEDICATED_PATCH | CUTANEOUS | Status: DC
  Administered 2023-02-18 – 2023-02-22 (×5): 1 via TRANSDERMAL
  Filled 2023-02-18 (×5): qty 1

## 2023-02-18 MED ORDER — FUROSEMIDE 10 MG/ML IJ SOLN
20.0000 mg | Freq: Once | INTRAMUSCULAR | Status: AC
  Administered 2023-02-18: 20 mg via INTRAVENOUS
  Filled 2023-02-18: qty 2

## 2023-02-18 MED ORDER — POLYETHYLENE GLYCOL 3350 17 G PO PACK
17.0000 g | PACK | Freq: Every day | ORAL | Status: DC | PRN
  Filled 2023-02-18: qty 1

## 2023-02-18 MED ORDER — SORBITOL 70 % SOLN
960.0000 mL | TOPICAL_OIL | Freq: Once | ORAL | Status: AC
  Administered 2023-02-18: 960 mL via RECTAL
  Filled 2023-02-18: qty 240

## 2023-02-18 NOTE — Progress Notes (Signed)
Bilateral lower extremity venous study completed.   Preliminary results relayed to MD and RN.  Please see CV Procedures for preliminary results.  Honest Safranek, RVT  9:26 AM 02/18/23

## 2023-02-18 NOTE — Progress Notes (Addendum)
   02/18/23 1430  Spiritual Encounters  Type of Visit Initial  Care provided to: Pt and family  Referral source Other (comment) (Spritual Consult)  Reason for visit Advance directives  OnCall Visit No   Chaplain responded to a spiritual consult for advanced directive paperwork.  The patient, Emmett Nadine Counts), wanted to establish a health care power of attorney. The paper work was filled out and ready to be notarized. No one is available at this time to complete this step. Paperwork was put into the hardcopy file until a notary is located at which time the process will be completed. Family member, Lynett Grimes, was briefed on the location of the paperwork and that we will attempt to coordinate with a notary on Monday. I also briefed her that if her grandfather is discharged the paperwork can be notarized  out side of the hospital and then returned at some point to be put in the file.   Valerie Roys [facility]  [phone]

## 2023-02-18 NOTE — Evaluation (Signed)
Physical Therapy Evaluation Patient Details Name: Dom Haverland MRN: [id number] DOB: Jun 25, 1936 Today's Date: 02/18/2023  History of Present Illness  87 y.o. 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 Past medical history of CVA, T2DM, HTN, HLD, stage IIIa CKD, BPH, anemia, thrombocytopenia, OA, and AAA managed medically due to prohibitive surgical risk  Clinical Impression  Pt admitted with above diagnosis.  Pt currently with functional limitations due to the deficits listed below (see PT Problem List). Pt will benefit from acute skilled PT to increase their independence and safety with mobility to allow discharge.  Pt assisted with ambulating in hallway however only able to tolerate short distance due to fatigue as well as reports back pain (RN notified).  Pt required supplemental oxygen (see separate progress note).  Pt would benefit from f/u therapy in home setting and has multiple family members able to assist upon d/c.        Recommendations for follow up therapy are one component of a multi-disciplinary discharge planning process, led by the attending physician.  Recommendations may be updated based on patient status, additional functional criteria and insurance authorization.  Follow Up Recommendations       Assistance Recommended at Discharge PRN  Patient can return home with the following  A little help with walking and/or transfers;A little help with bathing/dressing/bathroom;Help with stairs or ramp for entrance;Assistance with cooking/housework;Assist for transportation    Equipment Recommendations Rolling walker (2 wheels)  Recommendations for Other Services       Functional Status Assessment Patient has had a recent decline in their functional status and demonstrates the ability to make significant improvements in function in a reasonable and predictable amount of time.      Precautions / Restrictions Precautions Precautions: Fall Precaution Comments: monitor sats      Mobility  Bed Mobility Overal bed mobility: Needs Assistance Bed Mobility: Sit to Supine       Sit to supine: Supervision        Transfers Overall transfer level: Needs assistance Equipment used: Rolling walker (2 wheels) Transfers: Sit to/from Stand Sit to Stand: Min guard           General transfer comment: min/guard for safety    Ambulation/Gait Ambulation/Gait assistance: Min guard Gait Distance (Feet): 80 Feet Assistive device: Rolling walker (2 wheels) Gait Pattern/deviations: Step-through pattern, Decreased stride length Gait velocity: decr     General Gait Details: verbal cues for RW positioning, posture, pt required supplemental oxygen, denies SOB with mobilizing but fatigued quickly  Stairs            Wheelchair Mobility    Modified Rankin (Stroke Patients Only)       Balance Overall balance assessment: Needs assistance         Standing balance support: No upper extremity supported Standing balance-Leahy Scale: Fair Standing balance comment: static fair                             Pertinent Vitals/Pain Pain Assessment Pain Assessment: Faces Faces Pain Scale: Hurts little more Pain Location: back pain Pain Descriptors / Indicators: Sore Pain Intervention(s): Repositioned, Monitored during session (RN notified of pain)    Home Living Family/patient expects to be discharged to:: Private residence Living Arrangements: Spouse/significant other;Children Available Help at Discharge: Available 24 hours/day;Family Type of Home: House Home Access: Level entry       Home  Layout: One level Home Equipment: Cane - single point      Prior Function Prior Level of Function : Independent/Modified Independent                     Hand Dominance        Extremity/Trunk Assessment   Upper Extremity Assessment Upper  Extremity Assessment: Generalized weakness    Lower Extremity Assessment Lower Extremity Assessment: Generalized weakness    Cervical / Trunk Assessment Cervical / Trunk Assessment: Kyphotic  Communication   Communication: No difficulties  Cognition Arousal/Alertness: Awake/alert Behavior During Therapy: WFL for tasks assessed/performed Overall Cognitive Status: Within Functional Limits for tasks assessed                                          General Comments      Exercises     Assessment/Plan    PT Assessment Patient needs continued PT services  PT Problem List Decreased strength;Decreased activity tolerance;Decreased mobility;Decreased balance;Decreased knowledge of use of DME;Cardiopulmonary status limiting activity       PT Treatment Interventions Gait training;DME instruction;Therapeutic exercise;Functional mobility training;Therapeutic activities;Patient/family education;Balance training    PT Goals (Current goals can be found in the Care Plan section)  Acute Rehab PT Goals PT Goal Formulation: With patient Time For Goal Achievement: 03/04/23 Potential to Achieve Goals: Good    Frequency Min 1X/week     Co-evaluation               AM-PAC PT "6 Clicks" Mobility  Outcome Measure Help needed turning from your back to your side while in a flat bed without using bedrails?: A Little Help needed moving from lying on your back to sitting on the side of a flat bed without using bedrails?: A Little Help needed moving to and from a bed to a chair (including a wheelchair)?: A Little Help needed standing up from a chair using your arms (e.g., wheelchair or bedside chair)?: A Little Help needed to walk in hospital room?: A Little Help needed climbing 3-5 steps with a railing? : A Little 6 Click Score: 18    End of Session Equipment Utilized During Treatment: Gait belt Activity Tolerance: Patient tolerated treatment well Patient left: with  call bell/phone within reach;in bed;with family/visitor present Nurse Communication: Mobility status PT Visit Diagnosis: Other abnormalities of gait and mobility (R26.89)    Time: 9604-5409 PT Time Calculation (min) (ACUTE ONLY): 17 min   Charges:   PT Evaluation $PT Eval Low Complexity: 1 Low        Kati PT, DPT Physical Therapist Acute Rehabilitation Services Office: [phone]   Kati L Payson 02/18/2023, 1:29 PM

## 2023-02-18 NOTE — Progress Notes (Signed)
   Patient Details Name: [name] MRN: [id number] DOB: September 22, 1935   SATURATION QUALIFICATIONS: (This note is used to comply with regulatory documentation for home oxygen)  Patient Saturations on Room Air at Rest = 84%  Patient Saturations on Room Air while Ambulating = n/a  Patient Saturations on 2 Liters of oxygen while Ambulating = 96%  Please briefly explain why patient needs home oxygen: to improve oxygen saturations at rest and during physical activities such as ADLs and ambulation.   [name] 02/18/2023, 12:06 PM Paulino Door, DPT Physical Therapist Acute Rehabilitation Services Office: [phone]

## 2023-02-18 NOTE — Evaluation (Addendum)
Occupational Therapy Evaluation Patient Details Name: [name] MRN: [id number] DOB: 03-Aug-1936 Today's Date: 02/18/2023   History of Present Illness 87 y.o. 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 Past medical history of CVA, T2DM, HTN, HLD, stage IIIa CKD, BPH, anemia, thrombocytopenia, OA, and AAA managed medically due to prohibitive surgical risk   Clinical Impression   The pt is currently presenting below his baseline level of functioning for ADL management, currently requiring SBA to min guard assist for tasks, such as lower body dressing, sit to stand, and toileting at bathroom level. He was noted to be with general deconditioning, reports of increased nausea, low back pain, and slight generalized weakness. He will benefit from OT services in the acute care setting to maximize his independence with self-care tasks and to facilitate his safe return home.       Recommendations for follow up therapy are one component of a multi-disciplinary discharge planning process, led by the attending physician.  Recommendations may be updated based on patient status, additional functional criteria and insurance authorization.   Assistance Recommended at Discharge PRN  Patient can return home with the following Assist for transportation;Assistance with cooking/housework;Help with stairs or ramp for entrance    Functional Status Assessment  Patient has had a recent decline in their functional status and demonstrates the ability to make significant improvements in function in a reasonable and predictable amount of time.  Equipment Recommendations  None recommended by OT       Precautions / Restrictions Restrictions Weight Bearing Restrictions: No      Mobility Bed Mobility Overal bed mobility: Needs Assistance         Sit to supine: Supervision        Transfers Overall transfer level:  Needs assistance Equipment used: Rolling walker (2 wheels) Transfers: Sit to/from Stand Sit to Stand: Min guard                  Balance     Sitting balance-Leahy Scale: Good       Standing balance-Leahy Scale: Fair                ADL either performed or assessed with clinical judgement   ADL Overall ADL's : Needs assistance/impaired Eating/Feeding: Independent;Sitting Eating/Feeding Details (indicate cue type and reason): based on clinical judgement Grooming: Supervision/safety;Standing;Min guard Grooming Details (indicate cue type and reason): at sink level, based on clinical judgement         Upper Body Dressing : Set up;Sitting Upper Body Dressing Details (indicate cue type and reason): simulated Lower Body Dressing: Min guard;Sit to/from stand   Toilet Transfer: Min guard;Rolling walker (2 wheels);Ambulation                    Pertinent Vitals/Pain Pain Assessment Pain Assessment: 0-10 Pain Score: 6  Pain Location: back pain Pain Intervention(s): Limited activity within patient's tolerance, Monitored during session, Other (comment) (he reported receiving pain medication)     Hand Dominance Right   Extremity/Trunk Assessment Upper Extremity Assessment Upper Extremity Assessment: Overall WFL for tasks assessed   Lower Extremity Assessment Lower Extremity Assessment: Generalized weakness      Communication Communication Communication: No difficulties   Cognition Arousal/Alertness: Awake/alert Behavior During Therapy: WFL for tasks assessed/performed Overall Cognitive Status: Within Functional Limits for tasks assessed            General Comments: friendly, able to follow commands, oriented  x4                Home Living Family/patient expects to be discharged to:: Private residence Living Arrangements: Spouse/significant other;Children Available Help at Discharge: Family Type of Home: House Home Access: Stairs to enter  home; 2 stairs with unilateral rail to enter home from outside     Home Layout: Two level;Able to live on main level with bedroom/bathroom Alternate Level Stairs-Number of Steps: pt's bedroom and full bathroom are on main level of home   Bathroom Shower/Tub: Tub/shower unit         Home Equipment: Grab bars - tub/shower;Shower seat          Prior Functioning/Environment Prior Level of Function : Independent/Modified Independent             Mobility Comments: He was independent with ambulation. ADLs Comments: He was independent with ADLs and shared the cooking with his spouse.        OT Problem List: Decreased strength;Impaired balance (sitting and/or standing);Decreased knowledge of use of DME or AE;Pain;Decreased activity tolerance      OT Treatment/Interventions: Self-care/ADL training;Therapeutic exercise;Balance training;Therapeutic activities;Energy conservation;DME and/or AE instruction;Patient/family education    OT Goals(Current goals can be found in the care plan section) Acute Rehab OT Goals Patient Stated Goal: to feel better and to return to normal activities OT Goal Formulation: With patient Time For Goal Achievement: 03/04/23 Potential to Achieve Goals: Good ADL Goals Pt Will Perform Grooming: with modified independence;standing Pt Will Perform Lower Body Dressing: with modified independence;sit to/from stand Pt Will Transfer to Toilet: with modified independence;ambulating Pt Will Perform Toileting - Clothing Manipulation and hygiene: with modified independence;sit to/from stand  OT Frequency: Min 1X/week       AM-PAC OT "6 Clicks" Daily Activity     Outcome Measure Help from another person eating meals?: None Help from another person taking care of personal grooming?: None Help from another person toileting, which includes using toliet, bedpan, or urinal?: A Little Help from another person bathing (including washing, rinsing, drying)?: A  Little Help from another person to put on and taking off regular upper body clothing?: None Help from another person to put on and taking off regular lower body clothing?: A Little 6 Click Score: 21   End of Session Equipment Utilized During Treatment: Rolling walker (2 wheels) Nurse Communication: Mobility status  Activity Tolerance: Patient limited by fatigue Patient left: in bed;with call bell/phone within reach;with bed alarm set;with family/visitor present  OT Visit Diagnosis: Muscle weakness (generalized) (M62.81);Pain Pain - part of body:  (low back)                Time: 1610-9604 OT Time Calculation (min): 20 min Charges:  OT General Charges $OT Visit: 1 Visit OT Evaluation $OT Eval Low Complexity: 1 Low    Soleia Badolato L Governor Matos, OTR/L 02/18/2023, 4:28 PM

## 2023-02-18 NOTE — Progress Notes (Signed)
Mobility Specialist - Progress Note   02/18/23 1554  Mobility  Activity Ambulated with assistance to bathroom  Level of Assistance Standby assist, set-up cues, supervision of patient - no hands on  Assistive Device Front wheel walker  Distance Ambulated (ft) 5 ft  Activity Response Tolerated well  Mobility Referral Yes  $Mobility charge 1 Mobility   Pt received in bed requesting assistance to bathroom. Pt to bathroom after session with all needs met.    [facility]

## 2023-02-18 NOTE — TOC Progression Note (Addendum)
Transition of Care [facility]) - Progression Note    Patient Details  Name: [name] MRN: [id number] Date of Birth: April 03, 1936  [facility] CM/SW Contact  Larrie Kass, LCSW Phone Number: 02/18/2023, 2:20 PM  Clinical Narrative:    CSW spoke with pt's and family about recommendations for home O2 and rolling walker. Pt have no preference for DME company. CSW spoke with [place] with [employer]. Walker and portable tank will be delivered to pt's room prior to d/c. CSW also informed pt and family the [facility] agency cannot come out to the home until Sunday. Pt is to d/c Sunday if medically stable. MD has been notified. TOC to follow.  Adden  4:00pm Home health PT has been added to pt's [facility] services with [facility], will need orders . TOC to follow   Expected Discharge Plan: Home w Home Health Services Barriers to Discharge: Continued Medical Work up  Expected Discharge Plan and Services In-house Referral: Clinical Social Work     Living arrangements for the past 2 months: Single Family Home                           HH Arranged: RN HH Agency: [facility] Date HH Agency Contacted: 02/16/23 Time HH Agency Contacted: 1333 Representative spoke with at [facility] Agency: Tresa Endo   Social Determinants of Health (SDOH) Interventions SDOH Screenings   Food Insecurity: No Food Insecurity (02/13/2023)  Housing: Low Risk  (02/13/2023)  Transportation Needs: No Transportation Needs (02/13/2023)  Utilities: Not At Risk (02/13/2023)  Depression (PHQ2-9): Low Risk  (01/20/2023)  Financial Resource Strain: Low Risk  (05/27/2022)  Physical Activity: Inactive (05/27/2022)  Social Connections: Moderately Isolated (05/27/2022)  Stress: No Stress Concern Present (05/27/2022)  Tobacco Use: Medium Risk (02/13/2023)    Readmission Risk Interventions     No data to display

## 2023-02-18 NOTE — Progress Notes (Signed)
[facility]                                                                              [name], is a 87 y.o. male, DOB - 29-Mar-1936, WUJ:[id number] Admit date - 02/13/2023    Outpatient Primary MD for the patient is Jimmey Ralph, Katina Degree, MD  LOS - 5  days  Chief Complaint  Patient presents with   Fever       Brief summary   Patient is a 87 y.o. male with a history of CVA, T2DM, HTN, HLD, stage IIIa CKD, BPH, anemia, thrombocytopenia, OA, and AAA managed medically due to prohibitive surgical risk who presented to the ED on 02/13/2023 with fever and chills, fatigue. He's had no recent changes in symptoms, but has had nonproductive cough for many weeks. Initially afebrile and tachycardic with modest hypoxemia requiring 2L O2. Rare bacteriuria noted on UA. WBC 18.6k. Despite reassuring CXR, given the patient's fever, leukocytosis, hypoxia, and cough, antibiotics to treat for pneumonia were given and patient was admitted.  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  ID consulted.  Assessment & Plan    Principal Problem:   Acute respiratory failure with hypoxia (HCC), due to COPD, volume overload -Feeling a lot better today, was off O2 at the time of my examination. -CTA chest negative for PE, lower extremity Dopplers negative for any DVT -BNP 303.  -Likely volume overload, improved with Lasix 20 mg IV x 1 on 6/6 -Home O2 evaluation done, desatted to 84% on ambulation, will repeat Lasix 20 mg IV x 1.  I's and O's with 4.1 L net positive. -2D echo this admission with EF of 55 to 60%, normal diastolic parameters   Active Problems:  Listeria bacteremia:  -2D echo showed EF of 55 to 60%, moderate LVH, normal right ventricular systolic function, no vegetations -- MRI T and L spine due to acute onset back pain with fever, did not show evidence of infection.  -PICC line placed on 6/6 -ID clinic arranged on 03/15/2023 at 11:15 with Dr. Elinor Parkinson -Per ID, finish gentamicin  last dose today and continue ampicillin for the 3-week course until 6/25      AAA:  - Type 2 thoracoabdominal aneurysm (6.2cm) and bilateral hypogastric artery aneurysms (R 3.1cm and left 4.4cm) - Managed medically due to prohibitive surgical risk. Has followed up with [facility] vascular surgery, last available note is July 2023 at which point the recommended follow up only if he wished for surgery.  -Abdominal ultrasound showed AAA 5.9 cm, aneurysmal dilatation of both common iliac arteries, most on the left dilated to 6 cm.  Discussed with patient and wife, patient is followed at [facility] for the same.     AKI on stage IIIa CKD: - Improving Cr 1.7 > 1.5 with baseline 1.2-1.4.  -Creatinine worsened to 1.5 however still close to baseline  Generalized debility -PT OT evaluation    Hyponatremia: -Stable, asymptomatic   Hyperbilirubinemia: -Resolved    Normocytic anemia, thrombocytopenia:  -H&H stable, counts improving   HLD:  - Continue rosuvastatin   Prediabetes:  - SSI  Constipation -Received a Fleet enema yesterday with  no significant output, will continue bowel regimen, smog enema x 1 today    Estimated body mass index is 25.46 kg/m as calculated from the following:   Height as of this encounter: 5\' 5"  (1.651 m).   Weight as of this encounter: 69.4 kg.   NO CPR: Pt would wish for ACLS medications/AED and ventilatory support in the event of cardiac/respiratory arrest, but would not want CPR.   Code Status: Full code but no CPR DVT Prophylaxis:  heparin injection 5,000 Units Start: 02/15/23 1400 SCDs Start: 02/13/23 1557   Level of Care: Level of care: Progressive Family Communication: Updated patient's wife at the bedside Disposition Plan:      Remains inpatient appropriate: Hopefully home with PICC line tomorrow.  Overall feeling better and improving, patient and wife requested home tomorrow.   Procedures:  2D echo   Consultants:   Infectious  disease  Antimicrobials:   Anti-infectives (From admission, onward)    Start     Dose/Rate Route Frequency Ordered Stop   02/16/23 0000  ampicillin IVPB        8 g Intravenous Every 24 hours 02/16/23 1403 03/08/23 2359   02/15/23 1200  gentamicin (GARAMYCIN) IVPB 100 mg        100 mg 200 mL/hr over 30 Minutes Intravenous Every 24 hours 02/15/23 0913 02/17/23 1459   02/15/23 0930  ampicillin (OMNIPEN) 2 g in sodium chloride 0.9 % 100 mL IVPB        2 g 300 mL/hr over 20 Minutes Intravenous Every 6 hours 02/15/23 0842     02/15/23 0900  vancomycin (VANCOREADY) IVPB 1250 mg/250 mL  Status:  Discontinued        1,250 mg 166.7 mL/hr over 90 Minutes Intravenous  Once 02/15/23 0800 02/15/23 0810   02/15/23 0900  Ampicillin-Sulbactam (UNASYN) 3 g in sodium chloride 0.9 % 100 mL IVPB  Status:  Discontinued        3 g 200 mL/hr over 30 Minutes Intravenous Every 6 hours 02/15/23 0801 02/15/23 0842   02/15/23 0900  vancomycin (VANCOREADY) IVPB 1500 mg/300 mL  Status:  Discontinued        1,500 mg 150 mL/hr over 120 Minutes Intravenous Every 48 hours 02/15/23 0811 02/15/23 0842   02/15/23 0845  metroNIDAZOLE (FLAGYL) IVPB 500 mg  Status:  Discontinued        500 mg 100 mL/hr over 60 Minutes Intravenous Every 12 hours 02/15/23 0747 02/15/23 0755   02/13/23 1330  cefTRIAXone (ROCEPHIN) 2 g in sodium chloride 0.9 % 100 mL IVPB  Status:  Discontinued        2 g 200 mL/hr over 30 Minutes Intravenous Every 24 hours 02/13/23 1317 02/15/23 0747   02/13/23 1330  azithromycin (ZITHROMAX) 500 mg in sodium chloride 0.9 % 250 mL IVPB  Status:  Discontinued        500 mg 250 mL/hr over 60 Minutes Intravenous Every 24 hours 02/13/23 1317 02/15/23 0747          Medications  amLODipine  5 mg Oral BID   Chlorhexidine Gluconate Cloth  6 each Topical Daily   docusate sodium  100 mg Oral BID   furosemide  20 mg Intravenous Once   heparin injection (subcutaneous)  5,000 Units Subcutaneous Q8H   insulin  aspart  0-9 Units Subcutaneous TID WC   lidocaine  1 patch Transdermal Q24H   polyethylene glycol  17 g Oral BID   rosuvastatin  40 mg Oral Daily  Subjective:   [name] was seen and examined today.  Seen this morning, feeling a lot better from yesterday.  Shortness of breath much improved from yesterday.  Wife at the bedside.  No chest pain, dizziness, abdominal pain nausea or vomiting.  No fevers.  Having some back pain issues today.   Objective:   Vitals:   02/18/23 0800 02/18/23 0900 02/18/23 1000 02/18/23 1100  BP:      Pulse:      Resp: (!) 24 (!) 24 (!) 28 (!) 25  Temp:      TempSrc:      SpO2:      Weight:      Height:        Intake/Output Summary (Last 24 hours) at 02/18/2023 1215 Last data filed at 02/18/2023 1200 Gross per 24 hour  Intake 1135.18 ml  Output 1475 ml  Net -339.82 ml     Wt Readings from Last 3 Encounters:  02/13/23 69.4 kg  01/20/23 74.2 kg  12/30/22 73.5 kg    Physical Exam General: Alert and oriented x 3, NAD Cardiovascular: S1 S2 clear, RRR.  Respiratory: Fairly CTAB no wheezing Gastrointestinal: Soft, nontender, nondistended, NBS Ext: no pedal edema bilaterally Neuro: no new deficits Psych: Normal affect    Data Reviewed:  I have personally reviewed following labs    CBC Lab Results  Component Value Date   WBC 5.2 02/18/2023   RBC 3.13 (L) 02/18/2023   HGB 9.5 (L) 02/18/2023   HCT 29.0 (L) 02/18/2023   MCV 92.7 02/18/2023   MCH 30.4 02/18/2023   PLT 95 (L) 02/18/2023   MCHC 32.8 02/18/2023   RDW 14.4 02/18/2023   LYMPHSABS 0.2 (L) 02/13/2023   MONOABS 1.0 02/13/2023   EOSABS 0.0 02/13/2023   BASOSABS 0.0 02/13/2023     Last metabolic panel Lab Results  Component Value Date   NA 134 (L) 02/18/2023   K 3.9 02/18/2023   CL 95 (L) 02/18/2023   CO2 28 02/18/2023   BUN 15 02/18/2023   CREATININE 1.50 (H) 02/18/2023   GLUCOSE 120 (H) 02/18/2023   GFRNONAA 46 (L) 02/18/2023   GFRAA 70 08/15/2020    CALCIUM 8.4 (L) 02/18/2023   PROT 5.9 (L) 02/16/2023   ALBUMIN 2.6 (L) 02/16/2023   BILITOT 0.5 02/16/2023   ALKPHOS 49 02/16/2023   AST 18 02/16/2023   ALT 13 02/16/2023   ANIONGAP 11 02/18/2023    CBG (last 3)  Recent Labs    02/17/23 2212 02/18/23 0748 02/18/23 1159  GLUCAP 152* 96 155*      Coagulation Profile: Recent Labs  Lab 02/13/23 1249  INR 1.4*     Radiology Studies: I have personally reviewed the imaging studies  VAS [place] LOWER EXTREMITY VENOUS (DVT)  Result Date: 02/18/2023  Lower Venous DVT Study Patient Name:  [name]  Date of Exam:   02/18/2023 Medical Rec #: [id number]           Accession #:    [id number] Date of Birth: Jan 24, 1936           Patient Gender: M Patient Age:   49 years Exam Location:  [facility] Procedure:      VAS [place] LOWER EXTREMITY VENOUS (DVT) Referring Phys: Aylinn Rydberg --------------------------------------------------------------------------------  Indications: Edema, positive D-Dimer.  Comparison Study: No previous study, Performing Technologist: McKayla Maag RVT, [place]  Examination Guidelines: A complete evaluation includes B-mode imaging, spectral Doppler, color Doppler, and power Doppler as needed of all accessible portions  of each vessel. Bilateral testing is considered an integral part of a complete examination. Limited examinations for reoccurring indications may be performed as noted. The reflux portion of the exam is performed with the patient in reverse Trendelenburg.  +---------+---------------+---------+-----------+----------+--------------+ RIGHT    CompressibilityPhasicitySpontaneityPropertiesThrombus Aging +---------+---------------+---------+-----------+----------+--------------+ CFV      Full           Yes      Yes                                 +---------+---------------+---------+-----------+----------+--------------+ SFJ      Full                                                         +---------+---------------+---------+-----------+----------+--------------+ FV Prox  Full                                                        +---------+---------------+---------+-----------+----------+--------------+ FV Mid   Full                                                        +---------+---------------+---------+-----------+----------+--------------+ FV DistalFull                                                        +---------+---------------+---------+-----------+----------+--------------+ PFV      Full                                                        +---------+---------------+---------+-----------+----------+--------------+ POP      Full           Yes      Yes                                 +---------+---------------+---------+-----------+----------+--------------+ PTV      Full                                                        +---------+---------------+---------+-----------+----------+--------------+ PERO     Full                                                        +---------+---------------+---------+-----------+----------+--------------+   +---------+---------------+---------+-----------+----------+--------------+ LEFT  CompressibilityPhasicitySpontaneityPropertiesThrombus Aging +---------+---------------+---------+-----------+----------+--------------+ CFV      Full           Yes      Yes                                 +---------+---------------+---------+-----------+----------+--------------+ SFJ      Full                                                        +---------+---------------+---------+-----------+----------+--------------+ FV Prox  Full                                                        +---------+---------------+---------+-----------+----------+--------------+ FV Mid   Full                                                         +---------+---------------+---------+-----------+----------+--------------+ FV DistalFull                                                        +---------+---------------+---------+-----------+----------+--------------+ PFV      Full                                                        +---------+---------------+---------+-----------+----------+--------------+ POP      Full           Yes      Yes                                 +---------+---------------+---------+-----------+----------+--------------+ PTV      Full                                                        +---------+---------------+---------+-----------+----------+--------------+ PERO     Full                                                        +---------+---------------+---------+-----------+----------+--------------+     Summary: BILATERAL: - No evidence of deep vein thrombosis seen in the lower extremities, bilaterally. - No evidence of superficial venous thrombosis in the lower extremities, bilaterally. -No evidence of popliteal cyst, bilaterally.   *See table(s) above for measurements and observations.    Preliminary  CT Angio Chest Pulmonary Embolism (PE) W or WO Contrast  Result Date: 02/17/2023 CLINICAL DATA:  PE suspected, positive D-dimer EXAM: CT ANGIOGRAPHY CHEST WITH CONTRAST TECHNIQUE: Multidetector CT imaging of the chest was performed using the standard protocol during bolus administration of intravenous contrast. Multiplanar CT image reconstructions and MIPs were obtained to evaluate the vascular anatomy. RADIATION DOSE REDUCTION: This exam was performed according to the departmental dose-optimization program which includes automated exposure control, adjustment of the mA and/or kV according to patient size and/or use of iterative reconstruction technique. CONTRAST:  75mL OMNIPAQUE IOHEXOL 350 MG/ML SOLN COMPARISON:  12/29/2021 FINDINGS: Cardiovascular: Examination for pulmonary embolism  is somewhat limited by breath motion artifact. Within this limitation, no evidence of pulmonary embolism through the segmental pulmonary arterial level. Cardiomegaly. Gross enlargement of the main pulmonary artery measuring 4.8 cm in caliber. Three-vessel coronary artery calcifications. No pericardial effusion. Right upper extremity PICC. Aortic atherosclerosis. Large, fusiform aneurysm of the descending thoracic aorta, measuring 6.8 x 6.4 cm, previously 6.3 x 6.0 cm (series 5, image 192, series 7, image 71). Mediastinum/Nodes: No enlarged mediastinal, hilar, or axillary lymph nodes. Thyroid gland, trachea, and esophagus demonstrate no significant findings. Lungs/Pleura: Moderate centrilobular emphysema. No pleural effusion or pneumothorax. Upper Abdomen: No acute abnormality. Musculoskeletal: No chest wall abnormality. No acute osseous findings. Review of the MIP images confirms the above findings. IMPRESSION: 1. Examination for pulmonary embolism is somewhat limited by breath motion artifact. Within this limitation, no evidence of pulmonary embolism through the segmental pulmonary arterial level. 2. Cardiomegaly and coronary artery disease. 3. Gross enlargement of the main pulmonary artery, as can be seen in pulmonary hypertension. 4. Large, fusiform aneurysm of the descending thoracic aorta, measuring 6.8 x 6.4 cm, previously 6.3 x 6.0 cm. Greater than 5 mm growth over the past 12 months is associated with an increased risk of aneurysm rupture. Recommend cardiothoracic/vascular surgery referral if not already obtained. This recommendation follows 2010 ACCF/AHA/AATS/ACR/ASA/SCA/SCAI/SIR/STS/SVM Guidelines for the Diagnosis and Management of Patients With Thoracic Aortic Disease. Circulation. 2010; 121: Z610-R604. Aortic aneurysm NOS (ICD10-I71.9) 5. Emphysema. Aortic Atherosclerosis (ICD10-I70.0) and Emphysema (ICD10-J43.9). Electronically Signed   By: Jearld Lesch M.D.   On: 02/17/2023 17:50   DG CHEST PORT 1  VIEW  Result Date: 02/17/2023 CLINICAL DATA:  Dyspnea. EXAM: PORTABLE CHEST 1 VIEW COMPARISON:  February 13, 2023. FINDINGS: Stable cardiomegaly. Enlarged descending thoracic aortic aneurysm is again noted. Minimal bibasilar subsegmental atelectasis is noted. Right-sided PICC line is noted with tip in expected position of the SVC. Bony thorax is unremarkable. IMPRESSION: Minimal bibasilar subsegmental atelectasis. Stable appearance of descending thoracic aortic aneurysm. Electronically Signed   By: Lupita Raider M.D.   On: 02/17/2023 16:14       Payam Gribble M.D. [facility] 02/18/2023, 12:15 PM  Available via Epic secure chat 7am-7pm After 7 pm, please refer to night coverage provider listed on amion.

## 2023-02-19 ENCOUNTER — Inpatient Hospital Stay (HOSPITAL_COMMUNITY): Payer: Medicare HMO

## 2023-02-19 DIAGNOSIS — N179 Acute kidney failure, unspecified: Secondary | ICD-10-CM | POA: Diagnosis not present

## 2023-02-19 DIAGNOSIS — I714 Abdominal aortic aneurysm, without rupture, unspecified: Secondary | ICD-10-CM

## 2023-02-19 DIAGNOSIS — A329 Listeriosis, unspecified: Secondary | ICD-10-CM | POA: Diagnosis not present

## 2023-02-19 DIAGNOSIS — J9601 Acute respiratory failure with hypoxia: Secondary | ICD-10-CM | POA: Diagnosis not present

## 2023-02-19 LAB — GLUCOSE, CAPILLARY
Glucose-Capillary: 114 mg/dL — ABNORMAL HIGH (ref 70–99)
Glucose-Capillary: 161 mg/dL — ABNORMAL HIGH (ref 70–99)
Glucose-Capillary: 152 mg/dL — ABNORMAL HIGH (ref 70–99)
Glucose-Capillary: 183 mg/dL — ABNORMAL HIGH (ref 70–99)

## 2023-02-19 MED ORDER — DOCUSATE SODIUM 100 MG PO CAPS
100.0000 mg | ORAL_CAPSULE | Freq: Two times a day (BID) | ORAL | Status: DC
  Administered 2023-02-19 – 2023-02-21 (×5): 100 mg via ORAL
  Filled 2023-02-19 (×5): qty 1

## 2023-02-19 MED ORDER — MORPHINE SULFATE (PF) 2 MG/ML IV SOLN
2.0000 mg | Freq: Once | INTRAVENOUS | Status: AC
  Administered 2023-02-19: 2 mg via INTRAVENOUS
  Filled 2023-02-19: qty 1

## 2023-02-19 MED ORDER — MORPHINE SULFATE (PF) 2 MG/ML IV SOLN
1.0000 mg | INTRAVENOUS | Status: DC | PRN
  Administered 2023-02-19 – 2023-02-21 (×3): 1 mg via INTRAVENOUS
  Filled 2023-02-19 (×3): qty 1

## 2023-02-19 MED ORDER — MELATONIN 5 MG PO TABS
5.0000 mg | ORAL_TABLET | Freq: Every day | ORAL | Status: DC
  Administered 2023-02-19 – 2023-02-21 (×3): 5 mg via ORAL
  Filled 2023-02-19 (×3): qty 1

## 2023-02-19 MED ORDER — SIMETHICONE 80 MG PO CHEW
160.0000 mg | CHEWABLE_TABLET | Freq: Four times a day (QID) | ORAL | Status: DC | PRN
  Administered 2023-02-19: 160 mg via ORAL
  Filled 2023-02-19: qty 2

## 2023-02-19 MED ORDER — IOHEXOL 350 MG/ML SOLN
80.0000 mL | Freq: Once | INTRAVENOUS | Status: AC | PRN
  Administered 2023-02-19: 80 mL via INTRAVENOUS

## 2023-02-19 NOTE — Progress Notes (Signed)
I have called multiple times over the course of the afternoon for updates regarding the the pts current status in an effort to get a faster transport. Concern for symptomatic aorta/ iliac aneurysm.  Imaging was reviewed earlier noting no frank rupture.  Inflammation present with concern for symptomatic mycotic aneurysm. Pt declined repair at [facility]. [facility] stated that if ruptured they would not offer repair.  Awaiting pt arrival.  Victorino Sparrow MD

## 2023-02-19 NOTE — Progress Notes (Signed)
Pt can stand, pivot, & walk a very short distance, but very weak. Cannot maintain upright position for entirety of transportation. AAA high risk

## 2023-02-19 NOTE — Progress Notes (Signed)
Called report to [facility]. Spoke with Merrilee Seashore RN. Verbalized understanding of information. Transportation called. Family updated by both documenting nurse & attending MD.

## 2023-02-19 NOTE — Progress Notes (Addendum)
[facility]                                                                              Sevag Zumwalt, is a 87 y.o. male, DOB - 18-Mar-1936, ZOX:[id number] Admit date - 02/13/2023    Outpatient Primary MD for the patient is Ardith Dark, MD  LOS - 6  days  Chief Complaint  Patient presents with   Fever       Brief summary   Patient is a 87 y.o. male with a history of CVA, T2DM, HTN, HLD, stage IIIa CKD, BPH, anemia, thrombocytopenia, OA, and AAA managed medically due to prohibitive surgical risk who presented to the ED on 02/13/2023 with fever and chills, fatigue. He's had no recent changes in symptoms, but has had nonproductive cough for many weeks. Initially afebrile and tachycardic with modest hypoxemia requiring 2L O2. Rare bacteriuria noted on UA. WBC 18.6k. Despite reassuring CXR, given the patient's fever, leukocytosis, hypoxia, and cough, antibiotics to treat for pneumonia were given and patient was admitted.  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  ID consulted.  Assessment & Plan    Principal Problem:   Acute respiratory failure with hypoxia (HCC), due to COPD, volume overload -CTA chest negative for PE, lower extremity Dopplers negative for any DVT -BNP 303.  -Likely volume overload, improved with Lasix 20 mg IV x 1 on 6/6 and 6/7  -Home O2 evaluation recommended 2 L O2 via [place] [facility]  -2D echo this admission with EF of 55 to 60%, normal diastolic parameters   Active Problems:  Listeria bacteremia:  -2D echo showed EF of 55 to 60%, moderate LVH, normal right ventricular systolic function, no vegetations -- MRI T and L spine due to acute onset back pain with fever, did not show evidence of infection.  -Completed gentamicin on 02/17/2023, continue ampicillin. -PICC line placed on 6/6 -ID clinic arranged on 03/15/2023 at 11:15 with Dr. Elinor Parkinson -Per ID, continue ampicillin for the 3-week course until 6/25    low back pain -MRI thoracic and lumbar spine  on 6/6 showed no acute abnormality of thoracic or lumbar spine.  Mild spinal canal stenosis and moderate left neural foraminal stenosis at L4-5, severe facet arthrosis at L5-S1 with grade 1 anterolisthesis and mild spinal canal stenosis. -Continue pain control, Lidoderm patch  Acute lower Abdominal pain -unclear etiology, abdominal x-ray with a possible ileus however patient had 2 BMs yesterday -Obtain CT abdomen pelvis for further workup -Change diet to full liquids, Colace 100 mg twice daily, MiraLAX daily as needed -UA on 6/2 was negative for UTI Addendum 12:45pm I was notified by radiology, Dr. Molli Posey, CT abdomen shows 5.8 into 5.7 cm left common iliac artery aneurysm with perivascular edema/fluid around the aneurysm extending into the left extraperitoneal pelvic floor, no discrete hematoma, could reflect aneurysm leak.  Infected common iliac artery aneurysm could also have this appearance. -Discussed with vascular surgery on-call, Dr. Sherral Hammers, CT findings does not appear to be rupture, there is no hematoma and recommended CT angiogram of abdomen pelvis for better characterization if there is any leak -Also recommended transfer to [facility] for vascular surgery  evaluation. Discussed all the above with patient and his wife, daughter (from [place]) at bedside and another relative, Josue Hector on the phone who is a NP.  They all agreed with the plan above.    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 Has followed up with [facility] vascular surgery, last available note is July 2023 at which point the recommended follow up only if he wished for surgery.  -Abdominal ultrasound showed AAA 5.9 cm, aneurysmal dilatation of both common iliac arteries, most on the left dilated to 6 cm.  Discussed with patient and wife, patient is followed at [facility] for the same. -see above      AKI on  stage IIIa CKD: - Improving Cr 1.7 > 1.5 with baseline 1.2-1.4.  -Creatinine worsened to 1.5 however still close to baseline  Generalized debility -PT OT evaluation recommended home health PT    Hyponatremia: -Stable, asymptomatic   Hyperbilirubinemia: -Resolved    Normocytic anemia, thrombocytopenia:  -H&H stable, counts improving   HLD:  - Continue rosuvastatin   Prediabetes:  - SSI  Constipation -Resolved, had 2 BMs yesterday    Estimated body mass index is 25.46 kg/m as calculated from the following:   Height as of this encounter: 5\' 5"  (1.651 m).   Weight as of this encounter: 69.4 kg.   NO CPR: Pt would wish for ACLS medications/AED and ventilatory support in the event of cardiac/respiratory arrest, but would not want CPR.   Code Status: Full code but no CPR DVT Prophylaxis:  heparin injection 5,000 Units Start: 02/15/23 1400 SCDs Start: 02/13/23 1557   Level of Care: Level of care: Progressive Family Communication: Updated patient's wife at the bedside today Disposition Plan:      Remains inpatient appropriate:   Procedures:  2D echo   Consultants:   Infectious disease  Antimicrobials:   Anti-infectives (From admission, onward)    Start     Dose/Rate Route Frequency Ordered Stop   02/16/23 0000  ampicillin IVPB        8 g Intravenous Every 24 hours 02/16/23 1403 03/08/23 2359   02/15/23 1200  gentamicin (GARAMYCIN) IVPB 100 mg        100 mg 200 mL/hr over 30 Minutes Intravenous Every 24 hours 02/15/23 0913 02/17/23 1459   02/15/23 0930  ampicillin (OMNIPEN) 2 g in sodium chloride 0.9 % 100 mL IVPB        2 g 300 mL/hr over 20 Minutes Intravenous Every 6 hours 02/15/23 0842     02/15/23 0900  vancomycin (VANCOREADY) IVPB 1250 mg/250 mL  Status:  Discontinued        1,250 mg 166.7 mL/hr over 90 Minutes Intravenous  Once 02/15/23 0800 02/15/23 0810   02/15/23 0900  Ampicillin-Sulbactam (UNASYN) 3 g in sodium chloride 0.9 % 100 mL IVPB  Status:   Discontinued        3 g 200 mL/hr over 30 Minutes Intravenous Every 6 hours 02/15/23 0801 02/15/23 0842   02/15/23 0900  vancomycin (VANCOREADY) IVPB 1500 mg/300 mL  Status:  Discontinued        1,500 mg 150 mL/hr over 120 Minutes Intravenous Every 48 hours 02/15/23 0811 02/15/23 0842   02/15/23 0845  metroNIDAZOLE (FLAGYL) IVPB 500 mg  Status:  Discontinued        500 mg 100 mL/hr over 60 Minutes Intravenous Every 12 hours 02/15/23 0747 02/15/23 0755  02/13/23 1330  cefTRIAXone (ROCEPHIN) 2 g in sodium chloride 0.9 % 100 mL IVPB  Status:  Discontinued        2 g 200 mL/hr over 30 Minutes Intravenous Every 24 hours 02/13/23 1317 02/15/23 0747   02/13/23 1330  azithromycin (ZITHROMAX) 500 mg in sodium chloride 0.9 % 250 mL IVPB  Status:  Discontinued        500 mg 250 mL/hr over 60 Minutes Intravenous Every 24 hours 02/13/23 1317 02/15/23 0747          Medications  Chlorhexidine Gluconate Cloth  6 each Topical Daily   heparin injection (subcutaneous)  5,000 Units Subcutaneous Q8H   insulin aspart  0-9 Units Subcutaneous TID WC   lidocaine  1 patch Transdermal Q24H   melatonin  5 mg Oral QHS   rosuvastatin  40 mg Oral Daily      Subjective:   Xavi Iorio was seen and examined today.  Complaining of low back pain, lower abdominal pain, did not sleep well last night.  Had x-ray this morning which shows ileus.  Currently no nausea or vomiting.  Had 2 BMs yesterday.  Wife at the bedside.  Objective:   Vitals:   02/18/23 1433 02/18/23 2145 02/19/23 0553 02/19/23 0628  BP: (!) 95/55 104/71 117/74   Pulse: 88 82 83   Resp: (!) 22 17 (!) 21   Temp:  98.8 F (37.1 C) 98.7 F (37.1 C)   TempSrc:  Oral Oral   SpO2: (!) 89% 92% (!) 88% 97%  Weight:      Height:        Intake/Output Summary (Last 24 hours) at 02/19/2023 1151 Last data filed at 02/19/2023 1103 Gross per 24 hour  Intake 741.66 ml  Output 1850 ml  Net -1108.34 ml     Wt Readings from Last 3 Encounters:   02/13/23 69.4 kg  01/20/23 74.2 kg  12/30/22 73.5 kg   Physical Exam General: Alert and oriented x 3, NAD, feels miserable today with abdominal discomfort, back pain Cardiovascular: S1 S2 clear, RRR.  Respiratory: CTAB, no wheezing Gastrointestinal: Soft, lower abdominal pain, TTP, NBS, mildly distended Ext: no pedal edema bilaterally Neuro: no new deficits Psych: Normal affect    Data Reviewed:  I have personally reviewed following labs    CBC Lab Results  Component Value Date   WBC 5.2 02/18/2023   RBC 3.13 (L) 02/18/2023   HGB 9.5 (L) 02/18/2023   HCT 29.0 (L) 02/18/2023   MCV 92.7 02/18/2023   MCH 30.4 02/18/2023   PLT 95 (L) 02/18/2023   MCHC 32.8 02/18/2023   RDW 14.4 02/18/2023   LYMPHSABS 0.2 (L) 02/13/2023   MONOABS 1.0 02/13/2023   EOSABS 0.0 02/13/2023   BASOSABS 0.0 02/13/2023     Last metabolic panel Lab Results  Component Value Date   NA 134 (L) 02/18/2023   K 3.9 02/18/2023   CL 95 (L) 02/18/2023   CO2 28 02/18/2023   BUN 15 02/18/2023   CREATININE 1.50 (H) 02/18/2023   GLUCOSE 120 (H) 02/18/2023   GFRNONAA 46 (L) 02/18/2023   GFRAA 70 08/15/2020   CALCIUM 8.4 (L) 02/18/2023   PROT 5.9 (L) 02/16/2023   ALBUMIN 2.6 (L) 02/16/2023   BILITOT 0.5 02/16/2023   ALKPHOS 49 02/16/2023   AST 18 02/16/2023   ALT 13 02/16/2023   ANIONGAP 11 02/18/2023    CBG (last 3)  Recent Labs    02/18/23 1623 02/18/23 2141 02/19/23 0725  GLUCAP 133*  162* 114*      Coagulation Profile: Recent Labs  Lab 02/13/23 1249  INR 1.4*     Radiology Studies: I have personally reviewed the imaging studies  DG Abd 1 View  Result Date: 02/19/2023 CLINICAL DATA:  Pain. EXAM: ABDOMEN - 1 VIEW COMPARISON:  None Available. FINDINGS: Mild diffuse gaseous distention of small bowel and colon is identified in the abdomen. No overt features of bowel obstruction. Degenerative changes noted lumbar spine. SI joints and symphysis pubis unremarkable. IMPRESSION: Mild  diffuse gaseous distention of small bowel and colon without overt features of bowel obstruction. Electronically Signed   By: Kennith Center M.D.   On: 02/19/2023 10:19   VAS [place] LOWER EXTREMITY VENOUS (DVT)  Result Date: 02/19/2023  Lower Venous DVT Study Patient Name:  BEATRIZ JENNISON  Date of Exam:   02/18/2023 Medical Rec #: [id number]           Accession #:    [id number] Date of Birth: 1936-03-30           Patient Gender: M Patient Age:   25 years Exam Location:  [facility] Procedure:      VAS [place] LOWER EXTREMITY VENOUS (DVT) Referring Phys: Mikiya Nebergall --------------------------------------------------------------------------------  Indications: Edema, positive D-Dimer.  Comparison Study: No previous study, Performing Technologist: McKayla Maag RVT, [place]  Examination Guidelines: A complete evaluation includes B-mode imaging, spectral Doppler, color Doppler, and power Doppler as needed of all accessible portions of each vessel. Bilateral testing is considered an integral part of a complete examination. Limited examinations for reoccurring indications may be performed as noted. The reflux portion of the exam is performed with the patient in reverse Trendelenburg.  +---------+---------------+---------+-----------+----------+--------------+ RIGHT    CompressibilityPhasicitySpontaneityPropertiesThrombus Aging +---------+---------------+---------+-----------+----------+--------------+ CFV      Full           Yes      Yes                                 +---------+---------------+---------+-----------+----------+--------------+ SFJ      Full                                                        +---------+---------------+---------+-----------+----------+--------------+ FV Prox  Full                                                        +---------+---------------+---------+-----------+----------+--------------+ FV Mid   Full                                                         +---------+---------------+---------+-----------+----------+--------------+ FV DistalFull                                                        +---------+---------------+---------+-----------+----------+--------------+ PFV  Full                                                        +---------+---------------+---------+-----------+----------+--------------+ POP      Full           Yes      Yes                                 +---------+---------------+---------+-----------+----------+--------------+ PTV      Full                                                        +---------+---------------+---------+-----------+----------+--------------+ PERO     Full                                                        +---------+---------------+---------+-----------+----------+--------------+   +---------+---------------+---------+-----------+----------+--------------+ LEFT     CompressibilityPhasicitySpontaneityPropertiesThrombus Aging +---------+---------------+---------+-----------+----------+--------------+ CFV      Full           Yes      Yes                                 +---------+---------------+---------+-----------+----------+--------------+ SFJ      Full                                                        +---------+---------------+---------+-----------+----------+--------------+ FV Prox  Full                                                        +---------+---------------+---------+-----------+----------+--------------+ FV Mid   Full                                                        +---------+---------------+---------+-----------+----------+--------------+ FV DistalFull                                                        +---------+---------------+---------+-----------+----------+--------------+ PFV      Full                                                         +---------+---------------+---------+-----------+----------+--------------+  POP      Full           Yes      Yes                                 +---------+---------------+---------+-----------+----------+--------------+ PTV      Full                                                        +---------+---------------+---------+-----------+----------+--------------+ PERO     Full                                                        +---------+---------------+---------+-----------+----------+--------------+     Summary: BILATERAL: - No evidence of deep vein thrombosis seen in the lower extremities, bilaterally. - No evidence of superficial venous thrombosis in the lower extremities, bilaterally. -No evidence of popliteal cyst, bilaterally.   *See table(s) above for measurements and observations. Electronically signed by Gerarda Fraction on 02/19/2023 at 9:34:44 AM.    Final    CT Angio Chest Pulmonary Embolism (PE) W or WO Contrast  Result Date: 02/17/2023 CLINICAL DATA:  PE suspected, positive D-dimer EXAM: CT ANGIOGRAPHY CHEST WITH CONTRAST TECHNIQUE: Multidetector CT imaging of the chest was performed using the standard protocol during bolus administration of intravenous contrast. Multiplanar CT image reconstructions and MIPs were obtained to evaluate the vascular anatomy. RADIATION DOSE REDUCTION: This exam was performed according to the departmental dose-optimization program which includes automated exposure control, adjustment of the mA and/or kV according to patient size and/or use of iterative reconstruction technique. CONTRAST:  75mL OMNIPAQUE IOHEXOL 350 MG/ML SOLN COMPARISON:  12/29/2021 FINDINGS: Cardiovascular: Examination for pulmonary embolism is somewhat limited by breath motion artifact. Within this limitation, no evidence of pulmonary embolism through the segmental pulmonary arterial level. Cardiomegaly. Gross enlargement of the main pulmonary artery measuring 4.8 cm in caliber.  Three-vessel coronary artery calcifications. No pericardial effusion. Right upper extremity PICC. Aortic atherosclerosis. Large, fusiform aneurysm of the descending thoracic aorta, measuring 6.8 x 6.4 cm, previously 6.3 x 6.0 cm (series 5, image 192, series 7, image 71). Mediastinum/Nodes: No enlarged mediastinal, hilar, or axillary lymph nodes. Thyroid gland, trachea, and esophagus demonstrate no significant findings. Lungs/Pleura: Moderate centrilobular emphysema. No pleural effusion or pneumothorax. Upper Abdomen: No acute abnormality. Musculoskeletal: No chest wall abnormality. No acute osseous findings. Review of the MIP images confirms the above findings. IMPRESSION: 1. Examination for pulmonary embolism is somewhat limited by breath motion artifact. Within this limitation, no evidence of pulmonary embolism through the segmental pulmonary arterial level. 2. Cardiomegaly and coronary artery disease. 3. Gross enlargement of the main pulmonary artery, as can be seen in pulmonary hypertension. 4. Large, fusiform aneurysm of the descending thoracic aorta, measuring 6.8 x 6.4 cm, previously 6.3 x 6.0 cm. Greater than 5 mm growth over the past 12 months is associated with an increased risk of aneurysm rupture. Recommend cardiothoracic/vascular surgery referral if not already obtained. This recommendation follows 2010 ACCF/AHA/AATS/ACR/ASA/SCA/SCAI/SIR/STS/SVM Guidelines for the Diagnosis and Management of Patients With Thoracic Aortic Disease. Circulation. 2010; 121: W119-J478. Aortic aneurysm NOS (ICD10-I71.9) 5. Emphysema.  Aortic Atherosclerosis (ICD10-I70.0) and Emphysema (ICD10-J43.9). Electronically Signed   By: Jearld Lesch M.D.   On: 02/17/2023 17:50   DG CHEST PORT 1 VIEW  Result Date: 02/17/2023 CLINICAL DATA:  Dyspnea. EXAM: PORTABLE CHEST 1 VIEW COMPARISON:  February 13, 2023. FINDINGS: Stable cardiomegaly. Enlarged descending thoracic aortic aneurysm is again noted. Minimal bibasilar subsegmental  atelectasis is noted. Right-sided PICC line is noted with tip in expected position of the SVC. Bony thorax is unremarkable. IMPRESSION: Minimal bibasilar subsegmental atelectasis. Stable appearance of descending thoracic aortic aneurysm. Electronically Signed   By: Lupita Raider M.D.   On: 02/17/2023 16:14       Dorlis Judice M.D. [facility] 02/19/2023, 11:51 AM  Available via Epic secure chat 7am-7pm After 7 pm, please refer to night coverage provider listed on amion.

## 2023-02-19 NOTE — Progress Notes (Signed)
   02/19/23 1918  Spiritual Encounters  Type of Visit Initial  Care provided to: Family  Conversation partners present during encounter Nurse  Referral source Family  Reason for visit Advance directives  OnCall Visit Yes   Chaplain responded to an end-of-life consult and a request to support the family with an advanced directive. Their stated concern or hope is for the doctor's to talk with them before talking with the patient.   Chaplain explained that we do not have a notary available on the weekends, and family may consider consulting with a non-[facility] notary. Chaplain also shared that advanced directives decisions are the patient's decisions. Chaplain gave the family the paperwork for advanced directives, shared how to connect with [place] on Monday morning.   Spiritual care services available as needed.    Alda Ponder, Chaplain 02/19/23

## 2023-02-19 NOTE — Progress Notes (Signed)
[facility] here to transport pt. Pt expressing some discomfort in back. Obtain VS, which are stable. Administered zofran (prophylactic, before transportation & morphine), morphine 1 mg IV, & robaxin 500 mg po. Family just ready to order supper tray with liquids when transport arrived, & RN just administered insulin 2 units. Provided pt with apple juice 120 ml & pt urinated in toilet prior to IV pain medication. 1751 pt now on stretcher & asymptomatic for any s/s of distress.

## 2023-02-19 NOTE — Consult Note (Addendum)
Hospital Consult    Reason for Consult: Concern for symptomatic type II TAA Requesting Physician: [facility] medicine MRN #:  [id number]  History of Present Illness: This is a 87 y.o. 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  He was offered repair in July of last year, but told her that he had a very high mortality, and even if the case was successful, it would likely result in paralysis.  [name] elected to forego surgery understanding the risks of rupture.  [facility] was clear that in the setting of rupture, they would not offer surgery due to the high mortality associated.  He has a history of CVA, type 2 diabetes, stage III CKD, thrombocytopenia, OA.  He presented to the ED on 02/13/2023 with fever chills, and back pain.  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  Infectious disease was consulted.   Most recent blood cultures negative, however he continues to have right sided lower back pain.  Vascular surgery was called this afternoon after CTA demonstrated stranding around the left iliac portion of his aneurysm concerning for rupture.  He was transferred to [facility] for further evaluation.  On exam, [name] was resting comfortably.  He noted waxing and waning back pain which was focal at the right-sided sacroiliac joint.  He stated he was able to get comfortable in certain positions, however the effects were fleeting.  Past Medical History:  Diagnosis Date   BPH (benign prostatic hyperplasia)    COPD (chronic obstructive pulmonary disease) (HCC)    CVA (cerebral vascular accident) (HCC)    Diabetes mellitus without complication (HCC)    Dizziness and giddiness 07/09/2015   Hyperlipidemia    Hypertension    Renal disorder     Past Surgical History:  Procedure Laterality Date   APPENDECTOMY     TEE WITHOUT CARDIOVERSION  03/10/2012   Procedure:  TRANSESOPHAGEAL ECHOCARDIOGRAM (TEE);  Surgeon: Ricki Rodriguez, MD;  Location: [facility] ENDOSCOPY;  Service: Cardiovascular;  Laterality: N/A;    Allergies  Allergen Reactions   Ace Inhibitors     cough    Prior to Admission medications   Medication Sig Start Date End Date Taking? Authorizing Provider  acetaminophen (TYLENOL) 500 MG tablet Take 1,000 mg by mouth every 6 (six) hours as needed for fever or mild pain.   Yes [provider]  amLODipine (NORVASC) 5 MG tablet TAKE 1 TABLET (5 MG TOTAL) BY MOUTH IN THE MORNING AND AT BEDTIME. Patient taking differently: Take 5 mg by mouth 2 (two) times daily. TAKE 1 TABLET (5 MG TOTAL) BY MOUTH IN THE MORNING AND AT BEDTIME. 01/20/23  Yes Ardith Dark, MD  ampicillin IVPB Inject 8 g into the vein daily for 20 days. As a continuous infusion. Indication:  Listeria bacteremia First Dose: Yes Last Day of Therapy:  03/08/23 Labs - Once weekly:  CBC/D and BMP, Labs - Once weekly: ESR and CRP Method of administration: Ambulatory Pump (Continuous Infusion) Method of administration may be changed at the discretion of home infusion pharmacist based upon assessment of the patient and/or caregiver's ability to self-administer the medication ordered. 02/16/23 03/08/23 Yes Rai, Ripudeep K, MD  aspirin EC 81 MG tablet Take 1 tablet (81 mg total) by mouth daily. 02/14/20  Yes Orland Mustard, MD  azelastine (ASTELIN) 0.1 % nasal spray Place 2 sprays into both nostrils 2 (two) times daily. 01/20/23  Yes Ardith Dark, MD  Calcium Carb-Cholecalciferol (CALCIUM 600+D) 600-20 MG-MCG TABS Take 1 tablet by mouth daily.   Yes [provider]  furosemide (LASIX) 20 MG tablet Take 1 tablet (20 mg total) by mouth daily. 01/20/23  Yes Ardith Dark, MD  Methylcobalamin (B-12) 1000 MCG TBDP Take 1,000 mg by mouth daily.   Yes [provider]  montelukast (SINGULAIR) 10 MG tablet Take 1 tablet (10 mg total) by mouth at bedtime. 12/21/22  Yes Ardith Dark, MD   olmesartan (BENICAR) 40 MG tablet TAKE 1 TABLET EVERY DAY Patient taking differently: Take 40 mg by mouth daily. 01/17/23  Yes Ardith Dark, MD  Omega-3 Fatty Acids (FISH OIL) 1000 MG CAPS Take 1 capsule by mouth daily.   Yes [provider]  rosuvastatin (CRESTOR) 40 MG tablet Take 1 tablet (40 mg total) by mouth daily. 01/20/23  Yes Ardith Dark, MD    Social History   Socioeconomic History   Marital status: Married    Spouse name: Not on file   Number of children: 6   Years of education: HS   Highest education level: Not on file  Occupational History   Occupation: retired  Tobacco Use   Smoking status: Former   Smokeless tobacco: Never  Substance and Sexual Activity   Alcohol use: Yes    Alcohol/week: 0.0 standard drinks of alcohol    Comment: occasionally   Drug use: No   Sexual activity: Not on file  Other Topics Concern   Not on file  Social History Narrative   Patient drinks caffeine occasionally.   Patient is right handed.    Social Determinants of Health   Financial Resource Strain: Low Risk  (05/27/2022)   Overall Financial Resource Strain (CARDIA)    Difficulty of Paying Living Expenses: Not hard at all  Food Insecurity: No Food Insecurity (02/13/2023)   Hunger Vital Sign    Worried About Running Out of Food in the Last Year: Never true    Ran Out of Food in the Last Year: Never true  Transportation Needs: No Transportation Needs (02/13/2023)   PRAPARE - Administrator, Civil Service (Medical): No    Lack of Transportation (Non-Medical): No  Physical Activity: Inactive (05/27/2022)   Exercise Vital Sign    Days of Exercise per Week: 0 days    Minutes of Exercise per Session: 0 min  Stress: No Stress Concern Present (05/27/2022)   [name] [employer] - Occupational Stress Questionnaire    Feeling of Stress : Not at all  Social Connections: Moderately Isolated (05/27/2022)   Social Connection and Isolation Panel  [NHANES]    Frequency of Communication with Friends and Family: More than three times a week    Frequency of Social Gatherings with Friends and Family: Three times a week    Attends Religious Services: Never    Active Member of Clubs or Organizations: No    Attends Banker Meetings: Never    Marital Status: Married  Catering manager Violence: Not At Risk (02/13/2023)   Humiliation, Afraid, Rape, and Kick questionnaire    Fear of Current or Ex-Partner: No    Emotionally Abused: No    Physically Abused: No    Sexually Abused: No   Family History  Problem Relation Age of Onset   Heart attack Father    Cancer Mother    Cancer Sister        brain tumor  Heart attack Brother    Heart attack Brother    Heart disease Brother     ROS: Otherwise negative unless mentioned in HPI  Physical Examination  Vitals:   02/19/23 1728 02/19/23 1821  BP: 123/79 (!) 127/90  Pulse: 87   Resp:  20  Temp: 98.9 F (37.2 C) 98.4 F (36.9 C)  SpO2: 97% 95%   Body mass index is 25.46 kg/m.  General:  WDWN in NAD Gait: Not observed HENT: WNL, normocephalic Pulmonary: normal non-labored breathing, without Rales, rhonchi,  wheezing Cardiac: regular Abdomen: Soft, NT/ND, no masses Skin: without rashes Vascular Exam/Pulses: 2+ DP bilaterally Extremities: without ischemic changes, without Gangrene , without cellulitis; without open wounds;  Musculoskeletal: no muscle wasting or atrophy  Neurologic: A&O X 3;  No focal weakness or paresthesias are detected; speech is fluent/normal Psychiatric:  The pt has Normal affect. Lymph:  Unremarkable  CBC    Component Value Date/Time   WBC 5.2 02/18/2023 0440   RBC 3.13 (L) 02/18/2023 0440   HGB 9.5 (L) 02/18/2023 0440   HCT 29.0 (L) 02/18/2023 0440   PLT 95 (L) 02/18/2023 0440   MCV 92.7 02/18/2023 0440   MCH 30.4 02/18/2023 0440   MCHC 32.8 02/18/2023 0440   RDW 14.4 02/18/2023 0440   LYMPHSABS 0.2 (L) 02/13/2023 1249   MONOABS 1.0  02/13/2023 1249   EOSABS 0.0 02/13/2023 1249   BASOSABS 0.0 02/13/2023 1249    BMET    Component Value Date/Time   NA 134 (L) 02/18/2023 0440   K 3.9 02/18/2023 0440   CL 95 (L) 02/18/2023 0440   CO2 28 02/18/2023 0440   GLUCOSE 120 (H) 02/18/2023 0440   BUN 15 02/18/2023 0440   CREATININE 1.50 (H) 02/18/2023 0440   CREATININE 1.13 (H) 08/15/2020 1044   CALCIUM 8.4 (L) 02/18/2023 0440   GFRNONAA 46 (L) 02/18/2023 0440   GFRNONAA 60 08/15/2020 1044   GFRAA 70 08/15/2020 1044    COAGS: Lab Results  Component Value Date   INR 1.4 (H) 02/13/2023   INR 1.07 05/11/2015   INR 1.05 03/01/2012     ASSESSMENT/PLAN: This is a 87 y.o. male with known type II thoracoabdominal aneurysm.  He was offered repair at [facility], and elected to forego surgery due to the high risk of mortality and paralysis.  He presents with 6-day history of abdominal pain which was originally accompanied by fevers, chills, which was later deemed to be from Listeria bacteremia.  Since his scan last year, the type II thoracoabdominal aneurysm has continued to enlarge.  Most notably the left-sided common iliac artery aneurysm.  This has increased from 4.4 cm to 6.3 cm.  I am concerned that this portion of the aneurysm has likely become mycotic with Listeria bacteremia, leading to rapid enlargement.  Interestingly, his symptoms are more right-sided.  So there is a small chance this could be musculoskeletal.  He denies all abdominal pain.  [place] is aware that there are no surgical options. I had an honest discussion with him regarding his wishes.  He would like his pain controlled, and would like to go home as quickly as possible.  He is aware this is a lethal process, with an unknown timeline.   Recommend palliative consult and home with antibiotics as these may give more time.    Fara Olden MD MS [facility] [phone] 02/19/2023  6:58 PM

## 2023-02-20 DIAGNOSIS — A329 Listeriosis, unspecified: Secondary | ICD-10-CM | POA: Diagnosis not present

## 2023-02-20 DIAGNOSIS — I714 Abdominal aortic aneurysm, without rupture, unspecified: Secondary | ICD-10-CM | POA: Diagnosis not present

## 2023-02-20 DIAGNOSIS — Z7189 Other specified counseling: Secondary | ICD-10-CM | POA: Diagnosis not present

## 2023-02-20 DIAGNOSIS — Z515 Encounter for palliative care: Secondary | ICD-10-CM | POA: Diagnosis not present

## 2023-02-20 LAB — GLUCOSE, CAPILLARY
Glucose-Capillary: 102 mg/dL — ABNORMAL HIGH (ref 70–99)
Glucose-Capillary: 153 mg/dL — ABNORMAL HIGH (ref 70–99)
Glucose-Capillary: 134 mg/dL — ABNORMAL HIGH (ref 70–99)
Glucose-Capillary: 185 mg/dL — ABNORMAL HIGH (ref 70–99)

## 2023-02-20 LAB — CULTURE, BLOOD (ROUTINE X 2)
Special Requests: ADEQUATE
Special Requests: ADEQUATE

## 2023-02-20 MED ORDER — SODIUM CHLORIDE 0.9 % IV SOLN: INTRAVENOUS | Status: DC | PRN

## 2023-02-20 NOTE — Progress Notes (Signed)
  Progress Note    02/20/2023 8:22 AM * No surgery found *  Subjective:  right sided pain improved today but not gone away    Vitals:   02/19/23 1821 02/20/23 0755  BP: (!) 127/90 111/82  Pulse:  84  Resp: 20 (!) 22  Temp: 98.4 F (36.9 C) 98.8 F (37.1 C)  SpO2: 95% 93%    Physical Exam: General:  no acute distress Cardiac:  regular Lungs:  nonlabored Extremities:  2+ DP pulses bilaterally Abdomen:  soft, NT/ND  CBC    Component Value Date/Time   WBC 5.2 02/18/2023 0440   RBC 3.13 (L) 02/18/2023 0440   HGB 9.5 (L) 02/18/2023 0440   HCT 29.0 (L) 02/18/2023 0440   PLT 95 (L) 02/18/2023 0440   MCV 92.7 02/18/2023 0440   MCH 30.4 02/18/2023 0440   MCHC 32.8 02/18/2023 0440   RDW 14.4 02/18/2023 0440   LYMPHSABS 0.2 (L) 02/13/2023 1249   MONOABS 1.0 02/13/2023 1249   EOSABS 0.0 02/13/2023 1249   BASOSABS 0.0 02/13/2023 1249    BMET    Component Value Date/Time   NA 134 (L) 02/18/2023 0440   K 3.9 02/18/2023 0440   CL 95 (L) 02/18/2023 0440   CO2 28 02/18/2023 0440   GLUCOSE 120 (H) 02/18/2023 0440   BUN 15 02/18/2023 0440   CREATININE 1.50 (H) 02/18/2023 0440   CREATININE 1.13 (H) 08/15/2020 1044   CALCIUM 8.4 (L) 02/18/2023 0440   GFRNONAA 46 (L) 02/18/2023 0440   GFRNONAA 60 08/15/2020 1044   GFRAA 70 08/15/2020 1044    INR    Component Value Date/Time   INR 1.4 (H) 02/13/2023 1249     Intake/Output Summary (Last 24 hours) at 02/20/2023 5784 Last data filed at 02/20/2023 0422 Gross per 24 hour  Intake 751.42 ml  Output 600 ml  Net 151.42 ml      Assessment/Plan:  87 y.o. male with type II TAA, possibly mycotic   -Patient was consulted on yesterday for possibly symptomatic type II TAA. The patient has listeria bacteremia and there is concern that the aneurysm is mycotic -His pain has mostly been around the right side of his hip, although his left iliac aneurysm is the side that shows significant growth -Unfortunately the patient has no  surgical options. We have recommended pain control, antibiotics, and palliative consult. His pain is slightly better today   Loel Dubonnet, PA-C [facility] [phone] 02/20/2023 8:22 AM

## 2023-02-20 NOTE — Consult Note (Signed)
Consultation Note Date: 02/20/2023   Patient Name: [name]  DOB: [birth year]  MRN: [id number]  Age / Sex: 87 y.o., male  PCP: Ardith Dark, MD Referring Physician: Sharlene Dory,*  Reason for Consultation: Establishing goals of care  HPI/Patient Profile: 87 y.o. male  with past medical history of stroke, HTN, HLD, CKD stage 3a, diabetes, BPH, anemia, thrombocytopenia, OA, AAA admitted on 02/13/2023 with pneumonia and Listeria bacteremia. Now with concern for worsening pain in the setting of enlarging AAA.   Clinical Assessment and Goals of Care: Consult received and chart review completed. Noted Dr. Karin Lieu' consultation. I met today with [name] along with his wife, daughter, and granddaughter at bedside. His other children were made available by telephone to speak with me. [name] and his family are focused on his return home. They are prepared to receive and care for him. We reviewed changes and concerns for AAA and poor prognosis. They all have good understanding of what they are dealing with. We reviewed code status and consequences of this intervention - they all agree with DNR/DNI status. They wish to continue IV antibiotics to treat his bacteremia. Plans for ampicillin through 6/25. We reviewed assistance at home with home health vs hospice. They agree with help from hospice but they do wish to continue with IV antibiotics. Will ask TOC to assist if hospice can take with IV antibiotics - if not would recommend home health with close palliative follow up for support and a plan for symptom management in the event of acute decline (i.e. AAA rupture). All agree with plan with home with hospice and if not an option home health with palliative followed by hospice. They want him to enjoy his time at home and focus on being comfortable and restful. They want him to be comfortable at end of life  and surrounded by family at home.   All questions/concerns addressed. Emotional support provided. Updated Dr. Carmelia Roller, RN, CMRN.   Primary Decision Maker PATIENT    SUMMARY OF RECOMMENDATIONS   - DNR decided - Ideally home with hospice (but dependent on their ability to continue IV antibiotics)  Code Status/Advance Care Planning: DNR   Symptom Management:  Pain: Continue OxyIR 5 mg every 4 hours PRN moderate pain. Recommend roxanol 5 mg SL/PO every 3 hours PRN severe pain/pain not controlled by OxyIR.   Prognosis:  Overall prognosis poor. High risk for acute decompensation.   Discharge Planning: Home with hospice vs home with home health and palliative followed by hospice.       Primary Diagnoses: Present on Admission:  Acute respiratory failure with hypoxia (HCC)  AAA (abdominal aortic aneurysm) (HCC)  Chronic obstructive pulmonary disease (HCC)  HTN (hypertension)  Hyperlipidemia  Stage 3a chronic kidney disease (HCC)  Thrombocytopenia (HCC)  Normocytic anemia  Pre-diabetes   I have reviewed the medical record, interviewed the patient and family, and examined the patient. The following aspects are pertinent.  Past Medical History:  Diagnosis Date   BPH (benign prostatic hyperplasia)  COPD (chronic obstructive pulmonary disease) (HCC)    CVA (cerebral vascular accident) (HCC)    Diabetes mellitus without complication (HCC)    Dizziness and giddiness 07/09/2015   Hyperlipidemia    Hypertension    Renal disorder    Social History   Socioeconomic History   Marital status: Married    Spouse name: Not on file   Number of children: 6   Years of education: HS   Highest education level: Not on file  Occupational History   Occupation: retired  Tobacco Use   Smoking status: Former   Smokeless tobacco: Never  Substance and Sexual Activity   Alcohol use: Yes    Alcohol/week: 0.0 standard drinks of alcohol    Comment: occasionally   Drug use: No   Sexual  activity: Not on file  Other Topics Concern   Not on file  Social History Narrative   Patient drinks caffeine occasionally.   Patient is right handed.    Social Determinants of Health   Financial Resource Strain: Low Risk  (05/27/2022)   Overall Financial Resource Strain (CARDIA)    Difficulty of Paying Living Expenses: Not hard at all  Food Insecurity: No Food Insecurity (02/13/2023)   Hunger Vital Sign    Worried About Running Out of Food in the Last Year: Never true    Ran Out of Food in the Last Year: Never true  Transportation Needs: No Transportation Needs (02/13/2023)   PRAPARE - Administrator, Civil Service (Medical): No    Lack of Transportation (Non-Medical): No  Physical Activity: Inactive (05/27/2022)   Exercise Vital Sign    Days of Exercise per Week: 0 days    Minutes of Exercise per Session: 0 min  Stress: No Stress Concern Present (05/27/2022)   [employer] of Occupational Health - Occupational Stress Questionnaire    Feeling of Stress : Not at all  Social Connections: Moderately Isolated (05/27/2022)   Social Connection and Isolation Panel [NHANES]    Frequency of Communication with Friends and Family: More than three times a week    Frequency of Social Gatherings with Friends and Family: Three times a week    Attends Religious Services: Never    Active Member of Clubs or Organizations: No    Attends Engineer, structural: Never    Marital Status: Married   Family History  Problem Relation Age of Onset   Heart attack Father    Cancer Mother    Cancer Sister        brain tumor   Heart attack Brother    Heart attack Brother    Heart disease Brother    Scheduled Meds:  Chlorhexidine Gluconate Cloth  6 each Topical Daily   docusate sodium  100 mg Oral BID   heparin injection (subcutaneous)  5,000 Units Subcutaneous Q8H   insulin aspart  0-9 Units Subcutaneous TID WC   lidocaine  1 patch Transdermal Q24H   melatonin  5 mg Oral QHS    rosuvastatin  40 mg Oral Daily   Continuous Infusions:  sodium chloride 10 mL/hr at 02/20/23 0140   ampicillin (OMNIPEN) IV 2 g (02/20/23 1238)   PRN Meds:.sodium chloride, acetaminophen **OR** acetaminophen, methocarbamol, morphine injection, ondansetron **OR** ondansetron (ZOFRAN) IV, mouth rinse, oxyCODONE, polyethylene glycol, simethicone Allergies  Allergen Reactions   Ace Inhibitors     cough   Review of Systems  Constitutional:  Positive for activity change and fatigue.  Gastrointestinal:  Positive for abdominal pain.  Neurological:  Positive for weakness.    Physical Exam Vitals and nursing note reviewed.  Constitutional:      General: He is not in acute distress.    Appearance: He is ill-appearing.  Cardiovascular:     Rate and Rhythm: Normal rate.  Pulmonary:     Effort: No tachypnea, accessory muscle usage or respiratory distress.     Comments: Currently on room air Abdominal:     Palpations: Abdomen is soft.  Neurological:     Mental Status: He is alert and oriented to person, place, and time.     Vital Signs: BP 120/76 (BP Location: Left Arm)   Pulse 85   Temp 98.6 F (37 C) (Oral)   Resp (!) 22   Ht 5\' 5"  (1.651 m)   Wt 69.4 kg   SpO2 90%   BMI 25.46 kg/m  Pain Scale: 0-10   Pain Score: Asleep   SpO2: SpO2: 90 % O2 Device:SpO2: 90 % O2 Flow Rate: .O2 Flow Rate (L/min): 2 L/min  IO: Intake/output summary:  Intake/Output Summary (Last 24 hours) at 02/20/2023 1252 Last data filed at 02/20/2023 0930 Gross per 24 hour  Intake 751.42 ml  Output 700 ml  Net 51.42 ml    LBM: Last BM Date : 02/18/23 Baseline Weight: Weight: 68 kg Most recent weight: Weight: 69.4 kg     Palliative Assessment/Data:      Time Total: 60 min  Greater than 50%  of this time was spent counseling and coordinating care related to the above assessment and plan.  Signed by: Yong Channel, NP Palliative Medicine Team Pager # [phone] (M-F 8a-5p) Team Phone #  [phone] (Nights/Weekends)

## 2023-02-20 NOTE — Progress Notes (Signed)
PROGRESS NOTE    [name]  ZOX:[id number] DOB: 1936-01-19 DOA: 02/13/2023 PCP: Ardith Dark, MD     Brief Narrative:  Patient is a 87 y.o. male with a history of CVA, T2DM, HTN, HLD, stage IIIa CKD, BPH, anemia, thrombocytopenia, OA, and AAA managed medically due to prohibitive surgical risk who presented to the ED on 02/13/2023 with fever and chills, fatigue. He's had no recent changes in symptoms, but has had nonproductive cough for many weeks. Initially afebrile and tachycardic with modest hypoxemia requiring 2L O2. Rare bacteriuria noted on UA. WBC 18.6k. Despite reassuring CXR, given the patient's fever, leukocytosis, hypoxia, and cough, antibiotics to treat for pneumonia were given and patient was admitted.  He was found to have Listeria bacteremia and ID was consulted.   New events last 24 hours / Subjective: Patient reports pain is much better today.  Has yet to meet with the palliative care team.  Disappointed he was transferred to [facility] and they cannot operate.  He is on room air now, no chest pain or shortness of breath.  Denies any fevers.  Assessment & Plan:   Principal Problem:   Acute respiratory failure with hypoxia (HCC)  Resolved, on RA without s/s's  Active Problems:   AAA (abdominal aortic aneurysm) (HCC)  Morphine and oxycodone as needed  Appreciate vascular surgery input, surgery is not a viable option  Palliative care consulted today    Listeriosis  Appreciate ID recommendations  IV ampicillin 2 g every 6 hours, will need as an outpatient    HTN (hypertension)  Monitor    Hyperlipidemia  For now, continue Crestor 40 mg daily, may stop after palliative care consult    Normocytic anemia  Monitor    DM II   Monitor    Chronic obstructive pulmonary disease (HCC)   Stage 3a chronic kidney disease (HCC)  Stable    Thrombocytopenia (HCC)  Stable   DVT prophylaxis: HPN Code Status: Full Family Communication: Wife bedside, spoke with  his daughter on the phone.  Coming From: Home Disposition Plan: Home Barriers to Discharge: Develop goals of care, logistically determine his outpatient IV abx   Consultants:  Vascular surgery ID Palliative care consulted today  Antimicrobials:  Anti-infectives (From admission, onward)    Start     Dose/Rate Route Frequency Ordered Stop   02/16/23 0000  ampicillin IVPB        8 g Intravenous Every 24 hours 02/16/23 1403 03/08/23 2359   02/15/23 1200  gentamicin (GARAMYCIN) IVPB 100 mg        100 mg 200 mL/hr over 30 Minutes Intravenous Every 24 hours 02/15/23 0913 02/17/23 1459   02/15/23 0930  ampicillin (OMNIPEN) 2 g in sodium chloride 0.9 % 100 mL IVPB        2 g 300 mL/hr over 20 Minutes Intravenous Every 6 hours 02/15/23 0842     02/15/23 0900  vancomycin (VANCOREADY) IVPB 1250 mg/250 mL  Status:  Discontinued        1,250 mg 166.7 mL/hr over 90 Minutes Intravenous  Once 02/15/23 0800 02/15/23 0810   02/15/23 0900  Ampicillin-Sulbactam (UNASYN) 3 g in sodium chloride 0.9 % 100 mL IVPB  Status:  Discontinued        3 g 200 mL/hr over 30 Minutes Intravenous Every 6 hours 02/15/23 0801 02/15/23 0842   02/15/23 0900  vancomycin (VANCOREADY) IVPB 1500 mg/300 mL  Status:  Discontinued        1,500 mg 150 mL/hr  over 120 Minutes Intravenous Every 48 hours 02/15/23 0811 02/15/23 0842   02/15/23 0845  metroNIDAZOLE (FLAGYL) IVPB 500 mg  Status:  Discontinued        500 mg 100 mL/hr over 60 Minutes Intravenous Every 12 hours 02/15/23 0747 02/15/23 0755   02/13/23 1330  cefTRIAXone (ROCEPHIN) 2 g in sodium chloride 0.9 % 100 mL IVPB  Status:  Discontinued        2 g 200 mL/hr over 30 Minutes Intravenous Every 24 hours 02/13/23 1317 02/15/23 0747   02/13/23 1330  azithromycin (ZITHROMAX) 500 mg in sodium chloride 0.9 % 250 mL IVPB  Status:  Discontinued        500 mg 250 mL/hr over 60 Minutes Intravenous Every 24 hours 02/13/23 1317 02/15/23 0747        Objective: Vitals:    02/19/23 0628 02/19/23 1728 02/19/23 1821 02/20/23 0755  BP:  123/79 (!) 127/90 111/82  Pulse:  87  84  Resp:   20 (!) 22  Temp:  98.9 F (37.2 C) 98.4 F (36.9 C) 98.8 F (37.1 C)  TempSrc:  Oral Oral Oral  SpO2: 97% 97% 95% 93%  Weight:      Height:        Intake/Output Summary (Last 24 hours) at 02/20/2023 1028 Last data filed at 02/20/2023 0930 Gross per 24 hour  Intake 751.42 ml  Output 1300 ml  Net -548.58 ml   Filed Weights   02/13/23 1245 02/13/23 1601  Weight: 68 kg 69.4 kg    Examination:  General exam: Appears calm and comfortable  Respiratory system: Clear to auscultation. Respiratory effort normal. No respiratory distress. No conversational dyspnea.  Cardiovascular system: S1 & S2 heard, RRR. No murmurs. No pedal edema. Gastrointestinal system: Abdomen is nondistended, soft and nontender. Normal bowel sounds heard. Central nervous system: Alert and oriented. No focal neurological deficits. Speech clear.  Extremities: Symmetric in appearance  Skin: No rashes, lesions or ulcers on exposed skin  Psychiatry: Judgement and insight appear normal. Mood & affect appropriate.   Data Reviewed: I have personally reviewed following labs and imaging studies  CBC: Recent Labs  Lab 02/13/23 1249 02/14/23 0415 02/15/23 0417 02/16/23 0408 02/17/23 0417 02/18/23 0440  WBC 18.6* 20.7* 14.8* 9.6 7.2 5.2  NEUTROABS 17.2*  --   --   --   --   --   HGB 10.9* 10.7* 9.1* 8.8* 9.8* 9.5*  HCT 33.1* 32.3* 28.3* 28.0* 30.4* 29.0*  MCV 92.7 92.8 93.1 94.0 93.0 92.7  PLT 115* 98* 85* 77* 97* 95*   Basic Metabolic Panel: Recent Labs  Lab 02/14/23 0415 02/15/23 0417 02/16/23 0408 02/17/23 0417 02/18/23 0440  NA 130* 132* 131* 133* 134*  K 3.8 4.0 4.0 3.8 3.9  CL 99 101 99 97* 95*  CO2 22 23 24 27 28   GLUCOSE 147* 165* 185* 164* 120*  BUN 24* 27* 22 16 15   CREATININE 1.51* 1.58* 1.50* 1.31* 1.50*  CALCIUM 8.4* 7.9* 7.7* 8.4* 8.4*   GFR: Estimated Creatinine Clearance:  31.9 mL/min (A) (by C-G formula based on SCr of 1.5 mg/dL (H)).  Liver Function Tests: Recent Labs  Lab 02/13/23 1249 02/14/23 0415 02/16/23 0408  AST 19 23 18   ALT 14 15 13   ALKPHOS 58 51 49  BILITOT 1.6* 1.3* 0.5  PROT 7.3 7.0 5.9*  ALBUMIN 3.7 3.5 2.6*   Coagulation Profile: Recent Labs  Lab 02/13/23 1249  INR 1.4*   CBG: Recent Labs  Lab 02/19/23 0725  02/19/23 1209 02/19/23 1701 02/19/23 2119 02/20/23 0636  GLUCAP 114* 161* 152* 183* 102*   Sepsis Labs: Recent Labs  Lab 02/13/23 1249 02/13/23 1520  LATICACIDVEN 1.1 1.6    Recent Results (from the past 240 hour(s))  Resp panel by RT-PCR (RSV, Flu A&B, Covid) Anterior Nasal Swab     Status: None   Collection Time: 02/13/23 12:26 PM   Specimen: Anterior Nasal Swab  Result Value Ref Range Status   SARS Coronavirus 2 by RT PCR NEGATIVE NEGATIVE Final    Comment: (NOTE) SARS-CoV-2 target nucleic acids are NOT DETECTED.  The SARS-CoV-2 RNA is generally detectable in upper respiratory specimens during the acute phase of infection. The lowest concentration of SARS-CoV-2 viral copies this assay can detect is 138 copies/mL. A negative result does not preclude SARS-Cov-2 infection and should not be used as the sole basis for treatment or other patient management decisions. A negative result may occur with  improper specimen collection/handling, submission of specimen other than nasopharyngeal swab, presence of viral mutation(s) within the areas targeted by this assay, and inadequate number of viral copies(<138 copies/mL). A negative result must be combined with clinical observations, patient history, and epidemiological information. The expected result is Negative.  Fact Sheet for Patients:  BloggerCourse.com  Fact Sheet for Healthcare Providers:  SeriousBroker.it  This test is no t yet approved or cleared by the [place] FDA and  has been authorized for  detection and/or diagnosis of SARS-CoV-2 by FDA under an Emergency Use Authorization (EUA). This EUA will remain  in effect (meaning this test can be used) for the duration of the COVID-19 declaration under Section 564(b)(1) of the Act, 21 U.S.C.section 360bbb-3(b)(1), unless the authorization is terminated  or revoked sooner.       Influenza A by PCR NEGATIVE NEGATIVE Final   Influenza B by PCR NEGATIVE NEGATIVE Final    Comment: (NOTE) The Xpert Xpress SARS-CoV-2/FLU/RSV plus assay is intended as an aid in the diagnosis of influenza from Nasopharyngeal swab specimens and should not be used as a sole basis for treatment. Nasal washings and aspirates are unacceptable for Xpert Xpress SARS-CoV-2/FLU/RSV testing.  Fact Sheet for Patients: BloggerCourse.com  Fact Sheet for Healthcare Providers: SeriousBroker.it  This test is not yet approved or cleared by the [place] FDA and has been authorized for detection and/or diagnosis of SARS-CoV-2 by FDA under an Emergency Use Authorization (EUA). This EUA will remain in effect (meaning this test can be used) for the duration of the COVID-19 declaration under Section 564(b)(1) of the Act, 21 U.S.C. section 360bbb-3(b)(1), unless the authorization is terminated or revoked.     Resp Syncytial Virus by PCR NEGATIVE NEGATIVE Final    Comment: (NOTE) Fact Sheet for Patients: BloggerCourse.com  Fact Sheet for Healthcare Providers: SeriousBroker.it  This test is not yet approved or cleared by the [place] FDA and has been authorized for detection and/or diagnosis of SARS-CoV-2 by FDA under an Emergency Use Authorization (EUA). This EUA will remain in effect (meaning this test can be used) for the duration of the COVID-19 declaration under Section 564(b)(1) of the Act, 21 U.S.C. section 360bbb-3(b)(1), unless the authorization is  terminated or revoked.  Performed at [facility], [address], [place], [place] [address]   Blood Culture (routine x 2)     Status: Abnormal   Collection Time: 02/13/23 12:49 PM   Specimen: BLOOD  Result Value Ref Range Status   Specimen Description   Final  BLOOD LEFT ANTECUBITAL Performed at [facility], [address] [place], [place] [address]    Special Requests   Final    BOTTLES DRAWN AEROBIC AND ANAEROBIC Blood Culture adequate volume Performed at [facility], [address], [place], [place] [address]    Culture  Setup Time   Final    GRAM POSITIVE RODS IN BOTH AEROBIC AND ANAEROBIC BOTTLES CRITICAL RESULT CALLED TO, READ BACK BY AND VERIFIED WITH: PHARMD NICK G [id number] @1130  BY SM    Culture (A)  Final    LISTERIA MONOCYTOGENES Standardized susceptibility testing for this organism is not available. HEALTH DEPARTMENT NOTIFIED Performed at [facility], [address], [place], [place] [address]    Report Status 02/16/2023 FINAL  Final  Blood Culture (routine x 2)     Status: Abnormal   Collection Time: 02/13/23 12:55 PM   Specimen: BLOOD  Result Value Ref Range Status   Specimen Description   Final    BLOOD SITE NOT SPECIFIED Performed at [facility], [address] [place], [place] [address]    Special Requests   Final    BOTTLES DRAWN AEROBIC AND ANAEROBIC Blood Culture results may not be optimal due to an excessive volume of blood received in culture bottles Performed at [facility], [address], [place], [place] [address]    Culture  Setup Time   Final    GRAM POSITIVE RODS IN BOTH AEROBIC AND ANAEROBIC BOTTLES CRITICAL VALUE NOTED.  VALUE IS CONSISTENT WITH PREVIOUSLY REPORTED AND CALLED VALUE.    Culture (A)  Final    LISTERIA MONOCYTOGENES Standardized susceptibility testing for this organism is not available. HEALTH DEPARTMENT  NOTIFIED Performed at [facility], [address] [place], [place] [address]    Report Status 02/16/2023 FINAL  Final  Culture, blood (Routine X 2) w Reflex to ID Panel     Status: None   Collection Time: 02/15/23 11:06 AM   Specimen: Left Antecubital; Blood  Result Value Ref Range Status   Specimen Description   Final    LEFT ANTECUBITAL BLOOD Performed at [facility], [address] [place], [place] [address]    Special Requests   Final    AEROBIC BOTTLE ONLY Blood Culture adequate volume Performed at [facility], [address], [place], [place] [address]    Culture   Final    NO GROWTH 5 DAYS Performed at [facility], [address], [place], [place] [address]    Report Status 02/20/2023 FINAL  Final  Culture, blood (Routine X 2) w Reflex to ID Panel     Status: None   Collection Time: 02/15/23 11:06 AM   Specimen: BLOOD LEFT HAND  Result Value Ref Range Status   Specimen Description   Final    BLOOD LEFT HAND Performed at [facility], [address], [place], [place] [address]    Special Requests   Final    AEROBIC BOTTLE ONLY Blood Culture adequate volume Performed at [facility], [address], [place], [place] [address]    Culture   Final    NO GROWTH 5 DAYS Performed at [facility], [address], [place], [place] [address]    Report Status 02/20/2023 FINAL  Final      Radiology Studies: CT Angio Abd/Pel w/ and/or w/o  Result Date: 02/19/2023 CLINICAL DATA:  Possible aneurysm leak  of the left iliac artery aneurysm. Thoracic and abdominal aortic aneurysms. EXAM: CTA ABDOMEN AND PELVIS WITHOUT AND WITH CONTRAST TECHNIQUE: Multidetector CT imaging of the abdomen and pelvis was performed using the standard protocol during bolus administration of intravenous contrast. Multiplanar reconstructed images and MIPs were obtained and reviewed to evaluate the vascular anatomy. RADIATION  DOSE REDUCTION: This exam was performed according to the departmental dose-optimization program which includes automated exposure control, adjustment of the mA and/or kV according to patient size and/or use of iterative reconstruction technique. CONTRAST:  80mL OMNIPAQUE IOHEXOL 350 MG/ML SOLN COMPARISON:  02/19/2023 FINDINGS: VASCULAR Aorta: Descending thoracic aortic aneurysm 6.5 cm in diameter on image 11 series 9. At the hiatus the aorta measures 6.2 cm in diameter. Mural thrombus noted. Just below the level of the renal arteries, there is a 3.8 cm saccular aneurysm extending to the right of the abdominal aorta on image 68 series 9. Just above the inferior mesenteric artery origin there is an anterior 2.1 cm saccular aneurysm on image 75 series 9. On image 8 of series 9, there is a suspected small dissection flap in the descending thoracic aorta which is likely chronic with associated calcification on image 8 series 9. Similar appearance on CT thoracic spine of 12/29/2021. Celiac: Prominent stenosis at the origin of the celiac artery due to a combination of calcified plaque and median arcuate ligament syndrome. However, the celiac artery is not overtly occluded. There are some moderate collateral vessels between the SMA and celiac artery. SMA: Moderate stenosis due to calcified plaque at the origin of the SMA. No distal SMA filling defect is identified. Renals: Single right renal artery demonstrating at least moderate proximal stenosis due to hard and soft plaque. Similarly there is at least moderate and potentially prominent stenosis of the single left proximal renal artery due to hard and soft plaque. IMA: Patent Inflow: 6.3 cm aneurysm of the left common iliac artery with some internal irregular mural thrombus, abnormal stranding/edema in the soft tissues surrounding the aneurysm, and potentially some intramural hematoma inferiorly where there is faint increase in density along the aneurysm wall for example  on image 67 series 5, although strictly speaking this is somewhat difficult to differentiate from calcification in the wall and/or mural thrombus. From the inferior extent of this aneurysm the external and internal iliac arteries are visible and appear patent although there is high-grade stenosis at the origin of the internal iliac artery. Substantial atheromatous calcification of both iliac arteries. The right common iliac artery demonstrates aneurysmal dilatation but only up to 3.3 cm and without surrounding inflammatory stranding. Extraluminal leak of contrast is not directly visualized on the arterial phase images and delayed phase images do not include the pelvis. Proximal Outflow: External iliac and common femoral artery atherosclerotic vascular calcification without substantial stenosis. Veins: Unremarkable Review of the MIP images confirms the above findings. NON-VASCULAR Lower chest: Prominent main pulmonary artery at 4.1 cm diameter. Coronary atherosclerosis involving the left main, left anterior descending, circumflex, and right coronary arteries. Moderate cardiomegaly. Passive atelectasis in the left lower lobe related to the thoracic aneurysm. Hepatobiliary: Unremarkable Pancreas: Unremarkable Spleen: Unremarkable Adrenals/Urinary Tract: Simple right renal cysts. Left renal hypodense lesions are probably cysts but technically too small to characterize. No further imaging workup of these lesions is indicated. Adrenal glands unremarkable.  Urinary bladder unremarkable. Stomach/Bowel: Unremarkable Lymphatic: No pathologic adenopathy. Reproductive: Unremarkable Other: Nonspecific presacral edema. Musculoskeletal: Bridging spurring of both sacroiliac joints. Grade 1 degenerative anterolisthesis at L5-S1. IMPRESSION: 1. 6.3 cm  aneurysm of the left common iliac artery with some internal irregular mural thrombus, abnormal stranding/edema in the soft tissues surrounding the aneurysm, and potentially some  intramural hematoma inferiorly where there is faint increase in density along the aneurysm wall. The appearance is suspicious for leaking left common iliac artery aneurysm indicating contained or incipient rupture. An infected aneurysm is not entirely excluded. Further rupture could be life-threatening. Urgent vascular surgical consultation recommended. 2. Descending thoracic aortic aneurysm 6.5 cm in diameter. Cannot exclude small amount of focal chronic dissection along the upper margin of the thoracic aortic aneurysm. 3. 3.8 cm right lateral saccular aneurysm of the abdominal aorta just below the level of the renal arteries. 4. 2.1 cm anterior saccular aneurysm just above the inferior mesenteric artery origin. 5. 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 6. Prominent stenosis at the origin of the celiac artery due to a combination of calcified plaque and median arcuate ligament syndrome. However, the celiac artery is not overtly occluded. There are some moderate collateral vessels between the SMA and celiac artery. 7. Moderate stenosis at the origin of the SMA. 8. Moderate stenosis at the origin of both renal arteries. 9. Nonspecific presacral edema. 10. Prominent main pulmonary artery at 4.1 cm diameter, query pulmonary arterial hypertension. 11. Coronary atherosclerosis. 12. Moderate cardiomegaly. 13. Passive atelectasis in the left lower lobe related to the thoracic aneurysm. Aortic Atherosclerosis (ICD10-I70.0). Electronically Signed   By: Gaylyn Rong M.D.   On: 02/19/2023 14:09   CT ABDOMEN PELVIS WO CONTRAST  Addendum Date: 02/19/2023   ADDENDUM REPORT: 02/19/2023 12:18 ADDENDUM: Critical Value/emergent results were called by telephone at the time of interpretation on 02/19/2023 at 12:18 pm to provider RIPUDEEP RAI , who verbally acknowledged these results. Electronically Signed   By: Kennith Center M.D.   On: 02/19/2023 12:18   Result Date: 02/19/2023 CLINICAL DATA:  Abdominal pain. EXAM: CT  ABDOMEN AND PELVIS WITHOUT CONTRAST TECHNIQUE: Multidetector CT imaging of the abdomen and pelvis was performed following the standard protocol without IV contrast. RADIATION DOSE REDUCTION: This exam was performed according to the departmental dose-optimization program which includes automated exposure control, adjustment of the mA and/or kV according to patient size and/or use of iterative reconstruction technique. COMPARISON:  Chest CT 02/17/2023.  06/06/2007 FINDINGS: Lower chest: Atelectasis noted in the dependent lung bases with descending thoracic aortic aneurysm, better characterized on recent chest CT. Hepatobiliary: No suspicious focal abnormality in the liver on this study without intravenous contrast. There is no evidence for gallstones, gallbladder wall thickening, or pericholecystic fluid. No intrahepatic or extrahepatic biliary dilation. Pancreas: No focal mass lesion. No dilatation of the main duct. No intraparenchymal cyst. No peripancreatic edema. Spleen: No splenomegaly. No focal mass lesion. Adrenals/Urinary Tract: No adrenal nodule or mass. 7.3 cm water density lesion posterior right kidney compatible with simple cyst. Irregular low-density lesion anterior upper pole right kidney at the site of previously demonstrated 3.8 cm simple cyst probably represents involution. No suspicious abnormality in the left kidney. No evidence for hydroureter. The urinary bladder appears normal for the degree of distention. Stomach/Bowel: Stomach is unremarkable. No gastric wall thickening. No evidence of outlet obstruction. Duodenum is normally positioned as is the ligament of Treitz. No small bowel wall thickening. No small bowel dilatation. The appendix is not well visualized, but there is no edema or inflammation in the region of the cecum. No gross colonic mass. No colonic wall thickening. Vascular/Lymphatic: Juxtarenal abdominal aortic aneurysm measures 5.9 x 4.3 cm in maximum orthogonal diameter.  Aneurysmal  dilatation of the left common iliac artery noted measuring up to 5.8 x 5.7 cm in maximum orthogonal diameter. Aneurysm extends to the level of the iliac bifurcation there is perivascular edema/fluid around the common iliac artery aneurysm without a discrete hematoma. Right common iliac artery measures up to 3.1 cm diameter Reproductive: The prostate gland and seminal vesicles are unremarkable. Other: No intraperitoneal free fluid. Musculoskeletal: No worrisome lytic or sclerotic osseous abnormality. IMPRESSION: 1. 5.8 x 5.7 cm left common iliac artery aneurysm with perivascular edema/fluid around the aneurysm extending into the left extraperitoneal pelvic floor. No discrete hematoma. Findings could reflect aneurysm leak. Infected common iliac artery aneurysm could also have this appearance. 2. Descending thoracic aortic aneurysm better characterized on recent chest CTA. 3. 5.9 x 4.3 cm juxtarenal abdominal aortic aneurysm. 4. 3.1 cm right common iliac artery aneurysm. 5. Irregular low-density lesion anterior upper pole right kidney at the site of previously demonstrated 3.8 cm simple cyst probably represents involution of that lesion. MRI of the abdomen with and without contrast recommended to further evaluate. Electronically Signed: By: Kennith Center M.D. On: 02/19/2023 12:08   DG Abd 1 View  Result Date: 02/19/2023 CLINICAL DATA:  Pain. EXAM: ABDOMEN - 1 VIEW COMPARISON:  None Available. FINDINGS: Mild diffuse gaseous distention of small bowel and colon is identified in the abdomen. No overt features of bowel obstruction. Degenerative changes noted lumbar spine. SI joints and symphysis pubis unremarkable. IMPRESSION: Mild diffuse gaseous distention of small bowel and colon without overt features of bowel obstruction. Electronically Signed   By: Kennith Center M.D.   On: 02/19/2023 10:19     Scheduled Meds:  Chlorhexidine Gluconate Cloth  6 each Topical Daily   docusate sodium  100 mg Oral BID   heparin  injection (subcutaneous)  5,000 Units Subcutaneous Q8H   insulin aspart  0-9 Units Subcutaneous TID WC   lidocaine  1 patch Transdermal Q24H   melatonin  5 mg Oral QHS   rosuvastatin  40 mg Oral Daily   Continuous Infusions:  sodium chloride 10 mL/hr at 02/20/23 0140   ampicillin (OMNIPEN) IV 2 g (02/20/23 0629)     LOS: 7 days    Time spent: 35 minutes   Sharlene Dory, DO [facility] 02/20/2023, 10:28 AM   Available via Epic secure chat 7am-7pm After these hours, please refer to coverage provider listed on [email or web address]

## 2023-02-21 DIAGNOSIS — J9601 Acute respiratory failure with hypoxia: Secondary | ICD-10-CM | POA: Diagnosis not present

## 2023-02-21 LAB — BASIC METABOLIC PANEL
Anion gap: 11 (ref 5–15)
BUN: 15 mg/dL (ref 8–23)
CO2: 25 mmol/L (ref 22–32)
Calcium: 8.4 mg/dL — ABNORMAL LOW (ref 8.9–10.3)
Chloride: 97 mmol/L — ABNORMAL LOW (ref 98–111)
Creatinine, Ser: 1.72 mg/dL — ABNORMAL HIGH (ref 0.61–1.24)
GFR, Estimated: 39 mL/min — ABNORMAL LOW (ref >60)
Glucose, Bld: 120 mg/dL — ABNORMAL HIGH (ref 70–99)
Potassium: 3.7 mmol/L (ref 3.5–5.1)
Sodium: 133 mmol/L — ABNORMAL LOW (ref 135–145)

## 2023-02-21 LAB — GLUCOSE, CAPILLARY
Glucose-Capillary: 110 mg/dL — ABNORMAL HIGH (ref 70–99)
Glucose-Capillary: 190 mg/dL — ABNORMAL HIGH (ref 70–99)
Glucose-Capillary: 149 mg/dL — ABNORMAL HIGH (ref 70–99)
Glucose-Capillary: 152 mg/dL — ABNORMAL HIGH (ref 70–99)

## 2023-02-21 LAB — CBC
HCT: 28.6 % — ABNORMAL LOW (ref 39.0–52.0)
Hemoglobin: 9.1 g/dL — ABNORMAL LOW (ref 13.0–17.0)
MCH: 29.4 pg (ref 26.0–34.0)
MCHC: 31.8 g/dL (ref 30.0–36.0)
MCV: 92.3 fL (ref 80.0–100.0)
Platelets: 130 10*3/uL — ABNORMAL LOW (ref 150–400)
RBC: 3.1 MIL/uL — ABNORMAL LOW (ref 4.22–5.81)
RDW: 14.6 % (ref 11.5–15.5)
WBC: 6.9 10*3/uL (ref 4.0–10.5)
nRBC: 0 % (ref 0.0–0.2)

## 2023-02-21 MED ORDER — ASPIRIN 81 MG PO TBEC
81.0000 mg | DELAYED_RELEASE_TABLET | Freq: Every day | ORAL | Status: DC
  Administered 2023-02-22: 81 mg via ORAL
  Filled 2023-02-21: qty 1

## 2023-02-21 MED ORDER — OMEGA-3-ACID ETHYL ESTERS 1 G PO CAPS
1.0000 g | ORAL_CAPSULE | Freq: Every day | ORAL | Status: DC
  Administered 2023-02-21 – 2023-02-22 (×2): 1 g via ORAL
  Filled 2023-02-21 (×2): qty 1

## 2023-02-21 MED ORDER — MONTELUKAST SODIUM 10 MG PO TABS
10.0000 mg | ORAL_TABLET | Freq: Every day | ORAL | Status: DC
  Administered 2023-02-21: 10 mg via ORAL
  Filled 2023-02-21: qty 1

## 2023-02-21 MED ORDER — SENNOSIDES-DOCUSATE SODIUM 8.6-50 MG PO TABS
1.0000 | ORAL_TABLET | Freq: Two times a day (BID) | ORAL | Status: DC
  Administered 2023-02-21 – 2023-02-22 (×3): 1 via ORAL
  Filled 2023-02-21 (×3): qty 1

## 2023-02-21 NOTE — Progress Notes (Signed)
PROGRESS NOTE [name]  WUJ:[id number] DOB: Oct 17, 1935 DOA: 02/13/2023 PCP: Ardith Dark, MD  Brief Narrative/Hospital Course:  87 y.o. male with a history of CVA, T2DM, HTN, HLD, stage IIIa CKD, BPH, anemia, thrombocytopenia, OA, and AAA managed medically due to prohibitive surgical risk who presented to the ED on 02/13/2023 with fever and chills, fatigue. He's had no recent changes in symptoms, but has had nonproductive cough for many weeks. Initially afebrile and tachycardic with modest hypoxemia requiring 2L O2. Rare bacteriuria noted on UA. WBC 18.6k. Despite reassuring CXR, given the patient's fever, leukocytosis, hypoxia, and cough, antibiotics to treat for pneumonia were given and patient was admitted.  He was found to have Listeria bacteremia and ID was consulted-being treated with IV ampicillin for listeriosis. Vascular surgery seen the patient for type II TAA, vascular surgery felt patient has no surgical option advised pain control antibiotics and palliative consult-Palliative care following-DNR decided and ideally planning home with hospice but needing IV antibiotics at this time.     Subjective: Seen and examined this morning patient's wife and daughter at the bedside no complaints resting well.   Assessment and Plan: Principal Problem:   Acute respiratory failure with hypoxia (HCC) Active Problems:   HTN (hypertension)   Hyperlipidemia   Normocytic anemia   Pre-diabetes   Chronic obstructive pulmonary disease (HCC)   Stage 3a chronic kidney disease (HCC)   Thrombocytopenia (HCC)   AAA (abdominal aortic aneurysm) (HCC)   Listeriosis   Acute respiratory failure with hypoxia resolved,currently on room air.  Listeriosis: continue IV ampicillin as per ID.  Blood culture 6/2 with 2 sets of Listeria, repeat blood culture 6/4- no growth so far..  TTE negative.  No signs of manage encephalitis back pain is stable and showed AAA, leukocytosis resolved.  ID advised to complete  ampicillin until 6/25 with PICC line OP AT and ID follow-up on 7/2. At this time rescinding hospice plan he will go home with home health PT once antibiotic completed transition to home hospice  Type II NFA:OZHYQMVH surgery felt patient has no surgical option advised pain control antibiotics and palliative consult-Palliative care following-DNR decided and ideally planning home with hospice but needing IV antibiotics at this time.  Hyperlipidemia on Crestor Normocytic anemia monitor hemoglobin Type 2 diabetes mellitus blood sugar stable CKD stage III AA creatinine at 1.7. COPD stable Constipation:add stool softeners  DVT prophylaxis: heparin injection 5,000 Units Start: 02/15/23 1400 SCDs Start: 02/13/23 1557 Code Status:   Code Status: DNR Family Communication: plan of care discussed with patient/wife and daughter at bedside. Patient status is: Inpatient because of IV antibiotics Level of care: Progressive   Dispo: The patient is from: home            Anticipated disposition: home w/ hh with picc line for IV antibiotics tomorrow once DME arranged Objective: Vitals last 24 hrs: Vitals:   02/21/23 0425 02/21/23 0805 02/21/23 0808 02/21/23 1125  BP: 111/70 118/88 118/88 112/73  Pulse:  88 96 90  Resp: 18 (!) 26 (!) 24   Temp: 97.9 F (36.6 C) 97.7 F (36.5 C)  98.6 F (37 C)  TempSrc: Oral Oral  Oral  SpO2:  98% 92% (!) 89%  Weight:      Height:       Weight change:   Physical Examination: General exam: alert awake, older than stated age HEENT:Oral mucosa moist, Ear/Nose WNL grossly Respiratory system: bilaterally clear BS, no use of accessory muscle Cardiovascular system: S1 & S2 +,  No JVD. Gastrointestinal system: Abdomen soft,NT,ND, BS+ Nervous System:Alert, awake, moving extremities. Extremities: LE edema neg,distal peripheral pulses palpable.  Skin: No rashes,no icterus. MSK: Normal muscle bulk,tone, power  Medications reviewed:  Scheduled Meds:  Chlorhexidine  Gluconate Cloth  6 each Topical Daily   heparin injection (subcutaneous)  5,000 Units Subcutaneous Q8H   insulin aspart  0-9 Units Subcutaneous TID WC   lidocaine  1 patch Transdermal Q24H   melatonin  5 mg Oral QHS   rosuvastatin  40 mg Oral Daily   senna-docusate  1 tablet Oral BID   Continuous Infusions:  sodium chloride 10 mL/hr at 02/20/23 0140   ampicillin (OMNIPEN) IV 2 g (02/21/23 0524)      Diet Order             Diet regular Room service appropriate? Yes; Fluid consistency: Thin  Diet effective now                            Intake/Output Summary (Last 24 hours) at 02/21/2023 1201 Last data filed at 02/21/2023 0812 Gross per 24 hour  Intake 286.38 ml  Output 1600 ml  Net -1313.62 ml   Net IO Since Admission: 1,732.82 mL [02/21/23 1201]  Wt Readings from Last 3 Encounters:  02/13/23 69.4 kg  01/20/23 74.2 kg  12/30/22 73.5 kg     Unresulted Labs (From admission, onward)     Start     Ordered   02/21/23 0500  Basic metabolic panel  Daily,   R     Question:  Specimen collection method  Answer:  Lab=Lab collect   02/20/23 1046   02/21/23 0500  CBC  Daily,   R     Question:  Specimen collection method  Answer:  Lab=Lab collect   02/20/23 1046          Data Reviewed: I have personally reviewed following labs and imaging studies CBC: Recent Labs  Lab 02/15/23 0417 02/16/23 0408 02/17/23 0417 02/18/23 0440 02/21/23 0525  WBC 14.8* 9.6 7.2 5.2 6.9  HGB 9.1* 8.8* 9.8* 9.5* 9.1*  HCT 28.3* 28.0* 30.4* 29.0* 28.6*  MCV 93.1 94.0 93.0 92.7 92.3  PLT 85* 77* 97* 95* 130*   Basic Metabolic Panel: Recent Labs  Lab 02/15/23 0417 02/16/23 0408 02/17/23 0417 02/18/23 0440 02/21/23 0525  NA 132* 131* 133* 134* 133*  K 4.0 4.0 3.8 3.9 3.7  CL 101 99 97* 95* 97*  CO2 23 24 27 28 25   GLUCOSE 165* 185* 164* 120* 120*  BUN 27* 22 16 15 15   CREATININE 1.58* 1.50* 1.31* 1.50* 1.72*  CALCIUM 7.9* 7.7* 8.4* 8.4* 8.4*  liver Function Tests: Recent  Labs  Lab 02/16/23 0408  AST 18  ALT 13  ALKPHOS 49  BILITOT 0.5  PROT 5.9*  ALBUMIN 2.6*   No results for input(s): "HGBA1C" in the last 72 hours. CBG: Recent Labs  Lab 02/20/23 1153 02/20/23 1608 02/20/23 2118 02/21/23 0557 02/21/23 1127  GLUCAP 153* 134* 185* 110* 190*   Recent Results (from the past 240 hour(s))  Resp panel by RT-PCR (RSV, Flu A&B, Covid) Anterior Nasal Swab     Status: None   Collection Time: 02/13/23 12:26 PM   Specimen: Anterior Nasal Swab  Result Value Ref Range Status   SARS Coronavirus 2 by RT PCR NEGATIVE NEGATIVE Final    Comment: (NOTE) SARS-CoV-2 target nucleic acids are NOT DETECTED.  The SARS-CoV-2 RNA is generally detectable in  upper respiratory specimens during the acute phase of infection. The lowest concentration of SARS-CoV-2 viral copies this assay can detect is 138 copies/mL. A negative result does not preclude SARS-Cov-2 infection and should not be used as the sole basis for treatment or other patient management decisions. A negative result may occur with  improper specimen collection/handling, submission of specimen other than nasopharyngeal swab, presence of viral mutation(s) within the areas targeted by this assay, and inadequate number of viral copies(<138 copies/mL). A negative result must be combined with clinical observations, patient history, and epidemiological information. The expected result is Negative.  Fact Sheet for Patients:  BloggerCourse.com  Fact Sheet for Healthcare Providers:  SeriousBroker.it  This test is no t yet approved or cleared by the [place] FDA and  has been authorized for detection and/or diagnosis of SARS-CoV-2 by FDA under an Emergency Use Authorization (EUA). This EUA will remain  in effect (meaning this test can be used) for the duration of the COVID-19 declaration under Section 564(b)(1) of the Act, 21 U.S.C.section 360bbb-3(b)(1),  unless the authorization is terminated  or revoked sooner.       Influenza A by PCR NEGATIVE NEGATIVE Final   Influenza B by PCR NEGATIVE NEGATIVE Final    Comment: (NOTE) The Xpert Xpress SARS-CoV-2/FLU/RSV plus assay is intended as an aid in the diagnosis of influenza from Nasopharyngeal swab specimens and should not be used as a sole basis for treatment. Nasal washings and aspirates are unacceptable for Xpert Xpress SARS-CoV-2/FLU/RSV testing.  Fact Sheet for Patients: BloggerCourse.com  Fact Sheet for Healthcare Providers: SeriousBroker.it  This test is not yet approved or cleared by the [place] FDA and has been authorized for detection and/or diagnosis of SARS-CoV-2 by FDA under an Emergency Use Authorization (EUA). This EUA will remain in effect (meaning this test can be used) for the duration of the COVID-19 declaration under Section 564(b)(1) of the Act, 21 U.S.C. section 360bbb-3(b)(1), unless the authorization is terminated or revoked.     Resp Syncytial Virus by PCR NEGATIVE NEGATIVE Final    Comment: (NOTE) Fact Sheet for Patients: BloggerCourse.com  Fact Sheet for Healthcare Providers: SeriousBroker.it  This test is not yet approved or cleared by the [place] FDA and has been authorized for detection and/or diagnosis of SARS-CoV-2 by FDA under an Emergency Use Authorization (EUA). This EUA will remain in effect (meaning this test can be used) for the duration of the COVID-19 declaration under Section 564(b)(1) of the Act, 21 U.S.C. section 360bbb-3(b)(1), unless the authorization is terminated or revoked.  Performed at [facility], [address], [place], [place] [address]   Blood Culture (routine x 2)     Status: Abnormal   Collection Time: 02/13/23 12:49 PM   Specimen: BLOOD  Result Value Ref Range Status   Specimen  Description   Final    BLOOD LEFT ANTECUBITAL Performed at [facility], [address] [place], [place] [address]    Special Requests   Final    BOTTLES DRAWN AEROBIC AND ANAEROBIC Blood Culture adequate volume Performed at [facility], [address], [place], [place] [address]    Culture  Setup Time   Final    GRAM POSITIVE RODS IN BOTH AEROBIC AND ANAEROBIC BOTTLES CRITICAL RESULT CALLED TO, READ BACK BY AND VERIFIED WITH: PHARMD NICK G [id number] @1130  BY SM    Culture (A)  Final    LISTERIA MONOCYTOGENES Standardized susceptibility testing for this organism is not available.  HEALTH DEPARTMENT NOTIFIED Performed at [facility], [address] [place], [place] [address]    Report Status 02/16/2023 FINAL  Final  Blood Culture (routine x 2)     Status: Abnormal   Collection Time: 02/13/23 12:55 PM   Specimen: BLOOD  Result Value Ref Range Status   Specimen Description   Final    BLOOD SITE NOT SPECIFIED Performed at [facility], [address], [place], [place] [address]    Special Requests   Final    BOTTLES DRAWN AEROBIC AND ANAEROBIC Blood Culture results may not be optimal due to an excessive volume of blood received in culture bottles Performed at [facility], [address], [place], [place] [address]    Culture  Setup Time   Final    GRAM POSITIVE RODS IN BOTH AEROBIC AND ANAEROBIC BOTTLES CRITICAL VALUE NOTED.  VALUE IS CONSISTENT WITH PREVIOUSLY REPORTED AND CALLED VALUE.    Culture (A)  Final    LISTERIA MONOCYTOGENES Standardized susceptibility testing for this organism is not available. HEALTH DEPARTMENT NOTIFIED Performed at [facility], [address] [place], [place] [address]    Report Status 02/16/2023 FINAL  Final  Culture, blood (Routine X 2) w Reflex to ID Panel     Status: None   Collection Time: 02/15/23 11:06 AM   Specimen: Left Antecubital; Blood   Result Value Ref Range Status   Specimen Description   Final    LEFT ANTECUBITAL BLOOD Performed at [facility], [address] [place], [place] [address]    Special Requests   Final    AEROBIC BOTTLE ONLY Blood Culture adequate volume Performed at [facility], [address], [place], [place] [address]    Culture   Final    NO GROWTH 5 DAYS Performed at [facility], [address] [place], [place] [address]    Report Status 02/20/2023 FINAL  Final  Culture, blood (Routine X 2) w Reflex to ID Panel     Status: None   Collection Time: 02/15/23 11:06 AM   Specimen: BLOOD LEFT HAND  Result Value Ref Range Status   Specimen Description   Final    BLOOD LEFT HAND Performed at [facility], [address], [place], [place] [address]    Special Requests   Final    AEROBIC BOTTLE ONLY Blood Culture adequate volume Performed at [facility], [address], [place], [place] [address]    Culture   Final    NO GROWTH 5 DAYS Performed at [facility], [address], [place], [place] [address]    Report Status 02/20/2023 FINAL  Final    Antimicrobials: Anti-infectives (From admission, onward)    Start     Dose/Rate Route Frequency Ordered Stop   02/16/23 0000  ampicillin IVPB        8 g Intravenous Every 24 hours 02/16/23 1403 03/08/23 2359   02/15/23 1200  gentamicin (GARAMYCIN) IVPB 100 mg        100 mg 200 mL/hr over 30 Minutes Intravenous Every 24 hours 02/15/23 0913 02/17/23 1459   02/15/23 0930  ampicillin (OMNIPEN) 2 g in sodium chloride 0.9 % 100 mL IVPB        2 g 300 mL/hr over 20 Minutes Intravenous Every 6 hours 02/15/23 0842     02/15/23 0900  vancomycin (VANCOREADY) IVPB 1250 mg/250 mL  Status:  Discontinued  1,250 mg 166.7 mL/hr over 90 Minutes Intravenous  Once 02/15/23 0800 02/15/23 0810   02/15/23 0900  Ampicillin-Sulbactam (UNASYN) 3 g in sodium chloride 0.9 % 100 mL IVPB   Status:  Discontinued        3 g 200 mL/hr over 30 Minutes Intravenous Every 6 hours 02/15/23 0801 02/15/23 0842   02/15/23 0900  vancomycin (VANCOREADY) IVPB 1500 mg/300 mL  Status:  Discontinued        1,500 mg 150 mL/hr over 120 Minutes Intravenous Every 48 hours 02/15/23 0811 02/15/23 0842   02/15/23 0845  metroNIDAZOLE (FLAGYL) IVPB 500 mg  Status:  Discontinued        500 mg 100 mL/hr over 60 Minutes Intravenous Every 12 hours 02/15/23 0747 02/15/23 0755   02/13/23 1330  cefTRIAXone (ROCEPHIN) 2 g in sodium chloride 0.9 % 100 mL IVPB  Status:  Discontinued        2 g 200 mL/hr over 30 Minutes Intravenous Every 24 hours 02/13/23 1317 02/15/23 0747   02/13/23 1330  azithromycin (ZITHROMAX) 500 mg in sodium chloride 0.9 % 250 mL IVPB  Status:  Discontinued        500 mg 250 mL/hr over 60 Minutes Intravenous Every 24 hours 02/13/23 1317 02/15/23 0747      Culture/Microbiology    Component Value Date/Time   SDES  02/15/2023 1106    LEFT ANTECUBITAL BLOOD Performed at [facility], [address], [place], [place] [address]    SDES  02/15/2023 1106    BLOOD LEFT HAND Performed at [facility], [address], [place], [place] [address]    SPECREQUEST  02/15/2023 1106    AEROBIC BOTTLE ONLY Blood Culture adequate volume Performed at [facility], [address], [place], [place] [address]    SPECREQUEST  02/15/2023 1106    AEROBIC BOTTLE ONLY Blood Culture adequate volume Performed at [facility], [address], [place], [place] [address]    CULT  02/15/2023 1106    NO GROWTH 5 DAYS Performed at [facility], [address] [place], [place] [address]    CULT  02/15/2023 1106    NO GROWTH 5 DAYS Performed at [facility], [address] [place], [place] [address]    REPTSTATUS 02/20/2023 FINAL 02/15/2023 1106   REPTSTATUS 02/20/2023 FINAL 02/15/2023 1106   Radiology Studies: CT Angio Abd/Pel  w/ and/or w/o  Result Date: 02/19/2023 CLINICAL DATA:  Possible aneurysm leak of the left iliac artery aneurysm. Thoracic and abdominal aortic aneurysms. EXAM: CTA ABDOMEN AND PELVIS WITHOUT AND WITH CONTRAST TECHNIQUE: Multidetector CT imaging of the abdomen and pelvis was performed using the standard protocol during bolus administration of intravenous contrast. Multiplanar reconstructed images and MIPs were obtained and reviewed to evaluate the vascular anatomy. RADIATION DOSE REDUCTION: This exam was performed according to the departmental dose-optimization program which includes automated exposure control, adjustment of the mA and/or kV according to patient size and/or use of iterative reconstruction technique. CONTRAST:  80mL OMNIPAQUE IOHEXOL 350 MG/ML SOLN COMPARISON:  02/19/2023 FINDINGS: VASCULAR Aorta: Descending thoracic aortic aneurysm 6.5 cm in diameter on image 11 series 9. At the hiatus the aorta measures 6.2 cm in diameter. Mural thrombus noted. Just below the level of the renal arteries, there is a 3.8 cm saccular aneurysm extending to the right of the abdominal aorta on image 68 series 9. Just above the inferior mesenteric artery origin there is an anterior 2.1 cm  saccular aneurysm on image 75 series 9. On image 8 of series 9, there is a suspected small dissection flap in the descending thoracic aorta which is likely chronic with associated calcification on image 8 series 9. Similar appearance on CT thoracic spine of 12/29/2021. Celiac: Prominent stenosis at the origin of the celiac artery due to a combination of calcified plaque and median arcuate ligament syndrome. However, the celiac artery is not overtly occluded. There are some moderate collateral vessels between the SMA and celiac artery. SMA: Moderate stenosis due to calcified plaque at the origin of the SMA. No distal SMA filling defect is identified. Renals: Single right renal artery demonstrating at least moderate proximal stenosis due  to hard and soft plaque. Similarly there is at least moderate and potentially prominent stenosis of the single left proximal renal artery due to hard and soft plaque. IMA: Patent Inflow: 6.3 cm aneurysm of the left common iliac artery with some internal irregular mural thrombus, abnormal stranding/edema in the soft tissues surrounding the aneurysm, and potentially some intramural hematoma inferiorly where there is faint increase in density along the aneurysm wall for example on image 67 series 5, although strictly speaking this is somewhat difficult to differentiate from calcification in the wall and/or mural thrombus. From the inferior extent of this aneurysm the external and internal iliac arteries are visible and appear patent although there is high-grade stenosis at the origin of the internal iliac artery. Substantial atheromatous calcification of both iliac arteries. The right common iliac artery demonstrates aneurysmal dilatation but only up to 3.3 cm and without surrounding inflammatory stranding. Extraluminal leak of contrast is not directly visualized on the arterial phase images and delayed phase images do not include the pelvis. Proximal Outflow: External iliac and common femoral artery atherosclerotic vascular calcification without substantial stenosis. Veins: Unremarkable Review of the MIP images confirms the above findings. NON-VASCULAR Lower chest: Prominent main pulmonary artery at 4.1 cm diameter. Coronary atherosclerosis involving the left main, left anterior descending, circumflex, and right coronary arteries. Moderate cardiomegaly. Passive atelectasis in the left lower lobe related to the thoracic aneurysm. Hepatobiliary: Unremarkable Pancreas: Unremarkable Spleen: Unremarkable Adrenals/Urinary Tract: Simple right renal cysts. Left renal hypodense lesions are probably cysts but technically too small to characterize. No further imaging workup of these lesions is indicated. Adrenal glands  unremarkable.  Urinary bladder unremarkable. Stomach/Bowel: Unremarkable Lymphatic: No pathologic adenopathy. Reproductive: Unremarkable Other: Nonspecific presacral edema. Musculoskeletal: Bridging spurring of both sacroiliac joints. Grade 1 degenerative anterolisthesis at L5-S1. IMPRESSION: 1. 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 The appearance is suspicious for leaking left common iliac artery aneurysm indicating contained or incipient rupture. An infected aneurysm is not entirely excluded. Further rupture could be life-threatening. Urgent vascular surgical consultation recommended. 2. Descending thoracic aortic aneurysm 6.5 cm in diameter. Cannot exclude small amount of focal chronic dissection along the upper margin of the thoracic aortic aneurysm. 3. 3.8 cm right lateral saccular aneurysm of the abdominal aorta just below the level of the renal arteries. 4. 2.1 cm anterior saccular aneurysm just above the inferior mesenteric artery origin. 5. 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 6. Prominent stenosis at the origin of the celiac artery due to a combination of calcified plaque and median arcuate ligament syndrome. However, the celiac artery is not overtly occluded. There are  some moderate collateral vessels between the SMA and celiac artery. 7. Moderate stenosis at the origin of the SMA. 8. Moderate stenosis at the origin of both renal arteries. 9. Nonspecific presacral edema. 10. Prominent main pulmonary artery at 4.1 cm diameter, query pulmonary arterial hypertension. 11. Coronary atherosclerosis. 12. Moderate cardiomegaly. 13. Passive atelectasis in the left lower lobe related to the thoracic aneurysm. Aortic Atherosclerosis (ICD10-I70.0). Electronically Signed    By: Gaylyn Rong M.D.   On: 02/19/2023 14:09     LOS: 8 days   Lanae Boast, MD [facility]  02/21/2023, 12:01 PM

## 2023-02-21 NOTE — Progress Notes (Signed)
Physical Therapy Treatment Patient Details Name: [name] MRN: [id number] DOB: [birth year] Today's Date: 02/21/2023   History of Present Illness 87 y.o. 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 Transferred to [facility] 6/8 for surgical consult, however no intervention available.Past medical history of CVA, T2DM, HTN, HLD, stage IIIa CKD, BPH, anemia, thrombocytopenia, OA, and AAA managed medically due to prohibitive surgical risk    PT Comments    Pt supine in bed with family in room. Agreeable to getting up to walk with therapy. Pt supervision for bed mobility, and min guard for transfers and ambulation in hallway. Pt becomes fatigued quickly and request to return to room. Once back in room, request to lay back down. D/c plans remain appropriate at this time.      Recommendations for follow up therapy are one component of a multi-disciplinary discharge planning process, led by the attending physician.  Recommendations may be updated based on patient status, additional functional criteria and insurance authorization.     Assistance Recommended at Discharge PRN  Patient can return home with the following A little help with walking and/or transfers;A little help with bathing/dressing/bathroom;Help with stairs or ramp for entrance;Assistance with cooking/housework;Assist for transportation   Equipment Recommendations  Rolling walker (2 wheels)       Precautions / Restrictions Precautions Precautions: Fall Precaution Comments: monitor sats Restrictions Weight Bearing Restrictions: No     Mobility  Bed Mobility Overal bed mobility: Needs Assistance Bed Mobility: Sit to Supine       Sit to supine: Supervision        Transfers Overall transfer level: Needs assistance Equipment used: Rolling walker (2 wheels) Transfers: Sit to/from Stand Sit to Stand: Min guard           General  transfer comment: min/guard for safety    Ambulation/Gait Ambulation/Gait assistance: Min guard Gait Distance (Feet): 120 Feet Assistive device: Rolling walker (2 wheels) Gait Pattern/deviations: Step-through pattern, Decreased stride length Gait velocity: decr Gait velocity interpretation: <1.8 ft/sec, indicate of risk for recurrent falls   General Gait Details: min guard for safety         Balance Overall balance assessment: Needs assistance   Sitting balance-Leahy Scale: Good     Standing balance support: No upper extremity supported Standing balance-Leahy Scale: Fair Standing balance comment: static fair                            Cognition Arousal/Alertness: Awake/alert Behavior During Therapy: WFL for tasks assessed/performed Overall Cognitive Status: Within Functional Limits for tasks assessed                                             General Comments General comments (skin integrity, edema, etc.): VSS on RA max noted HR 108bpm      Pertinent Vitals/Pain Pain Assessment Pain Assessment: No/denies pain     PT Goals (current goals can now be found in the care plan section) Acute Rehab PT Goals PT Goal Formulation: With patient Time For Goal Achievement: 03/04/23 Potential to Achieve Goals: Good Progress towards PT goals: Progressing toward goals    Frequency    Min 1X/week      PT Plan Current plan remains appropriate       AM-PAC PT "6  Clicks" Mobility   Outcome Measure  Help needed turning from your back to your side while in a flat bed without using bedrails?: A Little Help needed moving from lying on your back to sitting on the side of a flat bed without using bedrails?: A Little Help needed moving to and from a bed to a chair (including a wheelchair)?: A Little Help needed standing up from a chair using your arms (e.g., wheelchair or bedside chair)?: A Little Help needed to walk in hospital room?: A  Little Help needed climbing 3-5 steps with a railing? : A Little 6 Click Score: 18    End of Session Equipment Utilized During Treatment: Gait belt Activity Tolerance: Patient tolerated treatment well Patient left: with call bell/phone within reach;in bed;with family/visitor present Nurse Communication: Mobility status PT Visit Diagnosis: Other abnormalities of gait and mobility (R26.89)     Time: 4098-1191 PT Time Calculation (min) (ACUTE ONLY): 19 min  Charges:  $Therapeutic Exercise: 8-22 mins                     Tymel Conely B. Beverely Risen PT, DPT Acute Rehabilitation Services Please use secure chat or  Call Office [phone]    Elon Alas Fleet 02/21/2023, 12:00 PM

## 2023-02-21 NOTE — Hospital Course (Addendum)
87 y.o. male with a history of CVA, T2DM, HTN, HLD, stage IIIa CKD, BPH, anemia, thrombocytopenia, OA, and AAA managed medically due to prohibitive surgical risk who presented to the ED on 02/13/2023 with fever and chills, fatigue. He's had no recent changes in symptoms, but has had nonproductive cough for many weeks. Initially afebrile and tachycardic with modest hypoxemia requiring 2L O2. Rare bacteriuria noted on UA. WBC 18.6k. Despite reassuring CXR, given the patient's fever, leukocytosis, hypoxia, and cough, antibiotics to treat for pneumonia were given and patient was admitted.  He was found to have Listeria bacteremia and ID was consulted-being treated with IV ampicillin for listeriosis. Vascular surgery seen the patient for type II TAA, vascular surgery felt patient has no surgical option advised pain control antibiotics and palliative consult-Palliative care following-DNR decided and ideally planning home with hospice but needing IV antibiotics at this time.

## 2023-02-21 NOTE — Progress Notes (Signed)
   Durable Medical Equipment (From admission, onward)        Start     Ordered  02/21/23 1218  For home use only DME Hospital bed  Once      Question Answer Comment Length of Need Lifetime  Patient has (list medical condition): type II thoracoabdominal aneurysm, COPD, hx CVA  The above medical condition requires: Patient requires the ability to reposition frequently  Bed type Semi-electric  Support Surface: Gel Overlay    02/21/23 1218

## 2023-02-21 NOTE — TOC Progression Note (Signed)
Transition of Care (TOC) - Progression Note  Donn Pierini RN, BSN Transitions of Care Unit 4E- RN Case Manager See Treatment Team for direct phone #   Patient Details  Name: [name] MRN: [id number] Date of Birth: 1935-10-15  Transition of Care [facility]) CM/SW Contact  Zenda Alpers, Lenn Sink, RN Phone Number: 02/21/2023, 12:52 PM  Clinical Narrative:    Referral received from [facility] regarding possible home hospice with iv abx- CM reached out to several hospice agencies to see if they could service pt with home IV abx- however this is not an option with Hospice services.  CM spoke with [facility] who could service pt under their HH/PC while receiving abx then transition over to their Hospice side once abx completed if family interested.   CM spoke with family at bedside, pt, wife, and daughter, (including DIL- Raynelle Fanning via [employer] speaker)- discussed the different options for return home, first on being staying with the original plan that was set up with [facility] for [facility] needs with a different agencies providing PC, then also provided them option of using [facility] that could do all services- List was provided using [email or web address]  Family asked questions and CM provided answers for clarity on how the different services would work- after discussion family has selected to go with [facility] to finish out the IV abx coordinating with [facility] home infusion and then [facility] will transition pt to their Hospice on completion of abx.   Family voiced that DME- RW and home 02 has already been arranged with [employer]- home concentrator scheduled to be delivered today, family would also like a hospital bed- CM has requested MD to place order and will coordinate that as well with [employer] for home delivery.   In discussed CM has expressed to family that EDD at the earliest could possibly be tomorrow pending coordination of HH and home IV abx- as well as DME delivery.  MD updated on the above.   Call  made to Liaison w/ [facility]- referral has been accepted- and Liaison to come see pt/family at bedside.   Call made to [facility] w/ Home infusion- she will coordinate home IV abx with tentative plan for transition home tomorrow.   [employer] called and liaison will f/u for DME- hospital bed to be delivered to home pending orders.   TOC will continue to follow, per family plan is for son to transport pt home, have informed them that son will need to bring portable 02 tank with him for transport.    Expected Discharge Plan: Home w Home Health Services Barriers to Discharge: Continued Medical Work up  Expected Discharge Plan and Services In-house Referral: Clinical Social Work Discharge Planning Services: CM Consult   Living arrangements for the past 2 months: Single Family Home                 DME Arranged: Hospital bed, Oxygen, Walker rolling DME Agency: [employer] Date DME Agency Contacted: 02/21/23 Time DME Agency Contacted: 1130 Representative spoke with at DME Agency: Mitch HH Arranged: RN, PT, OT HH Agency: [facility] Date [facility] Agency Contacted: 02/21/23 Time HH Agency Contacted: 1130 Representative spoke with at [facility] Agency: Eber Jones   Social Determinants of Health (SDOH) Interventions SDOH Screenings   Food Insecurity: No Food Insecurity (02/13/2023)  Housing: Low Risk  (02/13/2023)  Transportation Needs: No Transportation Needs (02/13/2023)  Utilities: Not At Risk (02/13/2023)  Depression (PHQ2-9): Low Risk  (01/20/2023)  Financial Resource Strain: Low Risk  (  05/27/2022)  Physical Activity: Inactive (05/27/2022)  Social Connections: Moderately Isolated (05/27/2022)  Stress: No Stress Concern Present (05/27/2022)  Tobacco Use: Medium Risk (02/13/2023)    Readmission Risk Interventions     No data to display

## 2023-02-21 NOTE — Plan of Care (Signed)

## 2023-02-22 ENCOUNTER — Telehealth: Payer: Self-pay | Admitting: Family Medicine

## 2023-02-22 DIAGNOSIS — A329 Listeriosis, unspecified: Secondary | ICD-10-CM | POA: Diagnosis not present

## 2023-02-22 DIAGNOSIS — J9601 Acute respiratory failure with hypoxia: Secondary | ICD-10-CM | POA: Diagnosis not present

## 2023-02-22 LAB — BASIC METABOLIC PANEL
Anion gap: 10 (ref 5–15)
BUN: 14 mg/dL (ref 8–23)
CO2: 25 mmol/L (ref 22–32)
Calcium: 8.6 mg/dL — ABNORMAL LOW (ref 8.9–10.3)
Chloride: 99 mmol/L (ref 98–111)
Creatinine, Ser: 1.56 mg/dL — ABNORMAL HIGH (ref 0.61–1.24)
GFR, Estimated: 44 mL/min — ABNORMAL LOW (ref >60)
Glucose, Bld: 115 mg/dL — ABNORMAL HIGH (ref 70–99)
Potassium: 3.8 mmol/L (ref 3.5–5.1)
Sodium: 134 mmol/L — ABNORMAL LOW (ref 135–145)

## 2023-02-22 LAB — CBC
HCT: 29.6 % — ABNORMAL LOW (ref 39.0–52.0)
Hemoglobin: 9.4 g/dL — ABNORMAL LOW (ref 13.0–17.0)
MCH: 29.3 pg (ref 26.0–34.0)
MCHC: 31.8 g/dL (ref 30.0–36.0)
MCV: 92.2 fL (ref 80.0–100.0)
Platelets: 143 10*3/uL — ABNORMAL LOW (ref 150–400)
RBC: 3.21 MIL/uL — ABNORMAL LOW (ref 4.22–5.81)
RDW: 14.6 % (ref 11.5–15.5)
WBC: 9.2 10*3/uL (ref 4.0–10.5)
nRBC: 0 % (ref 0.0–0.2)

## 2023-02-22 LAB — GLUCOSE, CAPILLARY
Glucose-Capillary: 123 mg/dL — ABNORMAL HIGH (ref 70–99)
Glucose-Capillary: 174 mg/dL — ABNORMAL HIGH (ref 70–99)

## 2023-02-22 MED ORDER — LIDOCAINE 5 % EX PTCH: 1.0000 | MEDICATED_PATCH | CUTANEOUS | 0 refills | Status: DC

## 2023-02-22 MED ORDER — MELATONIN 5 MG PO TABS: 5.0000 mg | ORAL_TABLET | Freq: Every evening | ORAL | 0 refills | Status: AC | PRN

## 2023-02-22 MED ORDER — SENNOSIDES-DOCUSATE SODIUM 8.6-50 MG PO TABS: 1.0000 | ORAL_TABLET | Freq: Two times a day (BID) | ORAL | 0 refills | Status: AC

## 2023-02-22 NOTE — Telephone Encounter (Signed)
Ok with me. Please place any necessary orders. 

## 2023-02-22 NOTE — TOC Transition Note (Signed)
Transition of Care (TOC) - CM/SW Discharge Note Donn Pierini RN, BSN Transitions of Care Unit 4E- RN Case Manager See Treatment Team for direct phone #   Patient Details  Name: [name] MRN: [id number] Date of Birth: 07-10-1936  Transition of Care [facility]) CM/SW Contact:  Darrold Span, RN Phone Number: 02/22/2023, 11:37 AM   Clinical Narrative:    Pt stable for transition home today, CM has reached out to [facility] with home infusion and confirmed they will be able to have home abx ready for today. Pam will plan to be here later this afternoon between 3:00-3:30 pm.   CM reached out to DIL-Julia who pt lives with and confirmed RW, home 02 was delivered to home yesterday they also have portable 02 to transport pt home with. Per [employer] insurance would not cover hospital bed at this time- discussed with Amil Amen that once pt is under Hospice- bed would be covered- Amil Amen voiced that they have someone that they are trying to get bed from but it needs a part so they are working on that for now.  Explained to [place] plan for Pam to be here this afternoon to hook up abx and Amil Amen expressed that she will try to be here for that and education at the bedside then family will transport home.   Pam updated and provided Julia's contact to coordinate with for education this afternoon for home abx needs.   Eber Jones with [facility] following for [facility] needs and aware of planned discharge for today.   GOLD DNR on chart and will need to be sent home with family.    Final next level of care: Home w Home Health Services Barriers to Discharge: Barriers Resolved   Patient Goals and CMS Choice CMS Medicare.gov Compare Post Acute Care list provided to:: Patient Choice offered to / list presented to : Patient  Discharge Placement                 Home w/ [facility]        Discharge Plan and Services Additional resources added to the After Visit Summary for   In-house Referral: Clinical Social  Work Discharge Planning Services: CM Consult Post Acute Care Choice: Durable Medical Equipment, Home Health, Hospice          DME Arranged: Hospital bed, Oxygen, Walker rolling DME Agency: [employer] Date DME Agency Contacted: 02/21/23 Time DME Agency Contacted: 1130 Representative spoke with at DME Agency: Mitch HH Arranged: RN, PT, OT [facility] Agency: [facility] Date [facility] Agency Contacted: 02/21/23 Time HH Agency Contacted: 1130 Representative spoke with at [facility] Agency: Eber Jones  Social Determinants of Health (SDOH) Interventions SDOH Screenings   Food Insecurity: No Food Insecurity (02/13/2023)  Housing: Low Risk  (02/13/2023)  Transportation Needs: No Transportation Needs (02/13/2023)  Utilities: Not At Risk (02/13/2023)  Depression (PHQ2-9): Low Risk  (01/20/2023)  Financial Resource Strain: Low Risk  (05/27/2022)  Physical Activity: Inactive (05/27/2022)  Social Connections: Moderately Isolated (05/27/2022)  Stress: No Stress Concern Present (05/27/2022)  Tobacco Use: Medium Risk (02/13/2023)     Readmission Risk Interventions    02/22/2023   11:37 AM  Readmission Risk Prevention Plan  Transportation Screening Complete  Home Care Screening Complete  Medication Review (RN CM) Complete

## 2023-02-22 NOTE — Telephone Encounter (Signed)
Please advise 

## 2023-02-22 NOTE — Telephone Encounter (Signed)
Home Health Verbal Orders  Agency:  [facility]  Caller:  Neena Rhymes and title  Requesting OT/ PT/ Skilled nursing/ Social Work/ Speech:    Reason for Request:  If provider will follow HH orders and Hospice after finishing antibiotics.  Frequency:    HH needs F2F w/in last 30 days     [phone]

## 2023-02-22 NOTE — Discharge Summary (Signed)
Physician Discharge Summary  [name] ZOX:[id number] DOB: 1936/04/11 DOA: 02/13/2023  PCP: Ardith Dark, MD  Admit date: 02/13/2023 Discharge date: 02/22/2023 Recommendations for Outpatient Follow-up:  Follow up with PCP in 1 weeks-call for appointment Please obtain BMP/CBC in one week  Discharge Dispo: [facility] Discharge Condition: Stable Code Status:   Code Status: DNR Diet recommendation:  Diet Order             Diet regular Room service appropriate? Yes; Fluid consistency: Thin  Diet effective now                    Brief/Interim Summary:  87 y.o. male with a history of CVA, T2DM, HTN, HLD, stage IIIa CKD, BPH, anemia, thrombocytopenia, OA, and AAA managed medically due to prohibitive surgical risk who presented to the ED on 02/13/2023 with fever and chills, fatigue. He's had no recent changes in symptoms, but has had nonproductive cough for many weeks. Initially afebrile and tachycardic with modest hypoxemia requiring 2L O2. Rare bacteriuria noted on UA. WBC 18.6k. Despite reassuring CXR, given the patient's fever, leukocytosis, hypoxia, and cough, antibiotics to treat for pneumonia were given and patient was admitted.  He was found to have Listeria bacteremia and ID was consulted-being treated with IV ampicillin for listeriosis. Vascular surgery seen the patient for type II TAA, vascular surgery felt patient has no surgical option advised pain control antibiotics and palliative consult-Palliative care following-DNR decided and ideally planning home with hospice but needing IV antibiotics at this time.    After completion of antibiotic patient will transition to home with hospice  Discharge Diagnoses:  Principal Problem:   Acute respiratory failure with hypoxia (HCC) Active Problems:   HTN (hypertension)   Hyperlipidemia   Normocytic anemia   Pre-diabetes   Chronic obstructive pulmonary disease (HCC)   Stage 3a chronic kidney disease (HCC)   Thrombocytopenia (HCC)   AAA  (abdominal aortic aneurysm) (HCC)   Listeriosis Acute respiratory failure with hypoxia resolved,currently on room air.   Listeriosis: continue IV ampicillin as per ID.  Blood culture 6/2 with 2 sets of Listeria, repeat blood culture 6/4- no growth so far..  TTE negative.  No signs of manage encephalitis back pain is stable and showed AAA, leukocytosis resolved.  ID advised to complete ampicillin until 6/25 with PICC line OP AT and ID follow-up on 7/2.At this time rescinding hospice plan he will go home with home health PT once antibiotic completed transition to home hospice   Type II WJX:BJYNWGNF surgery felt patient has no surgical option advised pain control antibiotics and palliative consult-Palliative care following-DNR decided and ideally planning home with hospice but needing IV antibiotics at this time.   Hyperlipidemia on Crestor Normocytic anemia monitor hemoglobin Type 2 diabetes mellitus blood sugar stable CKD stage III AA creatinine at 1.7. COPD stable Constipation:add stool softeners  Consults: PMT VVS ID  Subjective: AAOX3, feels ready to go home today  Discharge Exam: Vitals:   02/22/23 0305 02/22/23 0800  BP: 118/77 120/75  Pulse:  97  Resp: 20 (!) 21  Temp: 99 F (37.2 C) 99.1 F (37.3 C)  SpO2: 99%    General: Pt is alert, awake, not in acute distress Cardiovascular: RRR, S1/S2 +, no rubs, no gallops Respiratory: CTA bilaterally, no wheezing, no rhonchi Abdominal: Soft, NT, ND, bowel sounds + Extremities: no edema, no cyanosis  Discharge Instructions  Discharge Instructions     [facility] pharmacist to adjust dose for Vancomycin, Aminoglycosides and other  anti-infective therapies as requested by physician.   Complete by: As directed    [facility] to provide Cath Flo 2mg    Complete by: As directed    Administer for PICC line occlusion and as ordered by physician for other access device issues.   Anaphylaxis Kit: Provided to  treat any anaphylactic reaction to the medication being provided to the patient if First Dose or when requested by physician   Complete by: As directed    Epinephrine 1mg /ml vial / amp: Administer 0.3mg  (0.45ml) subcutaneously once for moderate to severe anaphylaxis, nurse to call physician and pharmacy when reaction occurs and call 911 if needed for immediate care   Diphenhydramine 50mg /ml IV vial: Administer 25-50mg  IV/IM PRN for first dose reaction, rash, itching, mild reaction, nurse to call physician and pharmacy when reaction occurs   Sodium Chloride 0.9% NS IV: Administer if needed for hypovolemic blood pressure drop or as ordered by physician after call to physician with anaphylactic reaction   Change dressing on IV access line weekly and PRN   Complete by: As directed    Discharge instructions   Complete by: As directed    Please call call MD or return to ER for similar or worsening recurring problem that brought you to hospital or if any fever,nausea/vomiting,abdominal pain, uncontrolled pain, chest pain,  shortness of breath or any other alarming symptoms.  Please follow-up your doctor as instructed in a week time and call the office for appointment.  Please avoid alcohol, smoking, or any other illicit substance and maintain healthy habits including taking your regular medications as prescribed.  You were cared for by a hospitalist during your hospital stay. If you have any questions about your discharge medications or the care you received while you were in the hospital after you are discharged, you can call the unit and ask to speak with the hospitalist on call if the hospitalist that took care of you is not available.  Once you are discharged, your primary care physician will handle any further medical issues. Please note that NO REFILLS for any discharge medications will be authorized once you are discharged, as it is imperative that you return to your primary care physician (or  establish a relationship with a primary care physician if you do not have one) for your aftercare needs so that they can reassess your need for medications and monitor your lab values   Flush IV access with Sodium Chloride 0.9% and Heparin 10 units/ml or 100 units/ml   Complete by: As directed    Home infusion instructions - [facility]   Complete by: As directed    Instructions: Flush IV access with Sodium Chloride 0.9% and Heparin 10units/ml or 100units/ml   Change dressing on IV access line: Weekly and PRN   Instructions Cath Flo 2mg : Administer for PICC Line occlusion and as ordered by physician for other access device   [facility] pharmacist to adjust dose for: Vancomycin, Aminoglycosides and other anti-infective therapies as requested by physician   Increase activity slowly   Complete by: As directed    Method of administration may be changed at the discretion of home infusion pharmacist based upon assessment of the patient and/or caregiver's ability to self-administer the medication ordered   Complete by: As directed    No wound care   Complete by: As directed       Allergies as of 02/22/2023       Reactions   Ace Inhibitors  cough        Medication List     STOP taking these medications    amLODipine 5 MG tablet Commonly known as: NORVASC   furosemide 20 MG tablet Commonly known as: LASIX   olmesartan 40 MG tablet Commonly known as: BENICAR       TAKE these medications    acetaminophen 500 MG tablet Commonly known as: TYLENOL Take 1,000 mg by mouth every 6 (six) hours as needed for fever or mild pain.   ampicillin  IVPB Inject 8 g into the vein daily for 20 days. As a continuous infusion. Indication:  Listeria bacteremia First Dose: Yes Last Day of Therapy:  03/08/23 Labs - Once weekly:  CBC/D and BMP, Labs - Once weekly: ESR and CRP Method of administration: Ambulatory Pump (Continuous Infusion) Method of administration may be  changed at the discretion of home infusion pharmacist based upon assessment of the patient and/or caregiver's ability to self-administer the medication ordered.   aspirin EC 81 MG tablet Take 1 tablet (81 mg total) by mouth daily.   azelastine 0.1 % nasal spray Commonly known as: ASTELIN Place 2 sprays into both nostrils 2 (two) times daily.   B-12 1000 MCG Tbdp Take 1,000 mg by mouth daily.   Calcium 600+D 600-20 MG-MCG Tabs Generic drug: Calcium Carb-Cholecalciferol Take 1 tablet by mouth daily.   Fish Oil 1000 MG Caps Take 1 capsule by mouth daily.   lidocaine 5 % Commonly known as: LIDODERM Place 1 patch onto the skin daily. Remove & Discard patch within 12 hours or as directed by MD Start taking on: February 23, 2023   melatonin 5 MG Tabs Take 1 tablet (5 mg total) by mouth at bedtime as needed for up to 14 days.   montelukast 10 MG tablet Commonly known as: SINGULAIR Take 1 tablet (10 mg total) by mouth at bedtime.   rosuvastatin 40 MG tablet Commonly known as: CRESTOR Take 1 tablet (40 mg total) by mouth daily.   senna-docusate 8.6-50 MG tablet Commonly known as: Senokot-S Take 1 tablet by mouth 2 (two) times daily for 14 days.               Durable Medical Equipment  (From admission, onward)           Start     Ordered   02/21/23 1218  For home use only DME Hospital bed  Once       Question Answer Comment  Length of Need Lifetime   Patient has (list medical condition): type II thoracoabdominal aneurysm, COPD, hx CVA   The above medical condition requires: Patient requires the ability to reposition frequently   Bed type Semi-electric   Support Surface: Gel Overlay      02/21/23 1218   02/18/23 1222  For home use only DME oxygen  Once       Question Answer Comment  Length of Need 12 Months   Mode or (Route) Nasal cannula   Liters per Minute 2   Frequency Continuous (stationary and portable oxygen unit needed)   Oxygen conserving device Yes    Oxygen delivery system Gas      02/18/23 1221   02/18/23 1206  For home use only DME Walker rolling  Once       Question Answer Comment  Walker: With 5 Inch Wheels   Patient needs a walker to treat with the following condition Weakness      02/18/23 1206  Discharge Care Instructions  (From admission, onward)           Start     Ordered   02/16/23 0000  Change dressing on IV access line weekly and PRN  (Home infusion instructions - [facility] )        02/16/23 1403            Follow-up Information     [facility] Follow up.   Why: [facility]- H[facility]N/PT arranged- they will assist with home IV abx until end date 6/25- with plan to change over to Hospice after that. Contact information: [address] [place] [address] [phone]         [facility] Follow up.   Why: Home IV abx arranged- they will coordinate with [facility] Contact information: [phone]        [facility] Follow up.   Why: ([facility])- home 02, hospital bed, rolling walker arranged- Contact information: [address] [place] [address] [phone]         Ardith Dark, MD Follow up in 1 week(s).   Specialty: Family Medicine Contact information: [address] [place] [address] [phone]                Allergies  Allergen Reactions   Ace Inhibitors     cough    The results of significant diagnostics from this hospitalization (including imaging, microbiology, ancillary and laboratory) are listed below for reference.    Microbiology: Recent Results (from the past 240 hour(s))  Resp panel by RT-PCR (RSV, Flu A&B, Covid) Anterior Nasal Swab     Status: None   Collection Time: 02/13/23 12:26 PM   Specimen: Anterior Nasal Swab  Result Value Ref Range Status   SARS Coronavirus 2 by RT PCR NEGATIVE NEGATIVE Final    Comment: (NOTE) SARS-CoV-2 target nucleic  acids are NOT DETECTED.  The SARS-CoV-2 RNA is generally detectable in upper respiratory specimens during the acute phase of infection. The lowest concentration of SARS-CoV-2 viral copies this assay can detect is 138 copies/mL. A negative result does not preclude SARS-Cov-2 infection and should not be used as the sole basis for treatment or other patient management decisions. A negative result may occur with  improper specimen collection/handling, submission of specimen other than nasopharyngeal swab, presence of viral mutation(s) within the areas targeted by this assay, and inadequate number of viral copies(<138 copies/mL). A negative result must be combined with clinical observations, patient history, and epidemiological information. The expected result is Negative.  Fact Sheet for Patients:  BloggerCourse.com  Fact Sheet for Healthcare Providers:  SeriousBroker.it  This test is no t yet approved or cleared by the [place] FDA and  has been authorized for detection and/or diagnosis of SARS-CoV-2 by FDA under an Emergency Use Authorization (EUA). This EUA will remain  in effect (meaning this test can be used) for the duration of the COVID-19 declaration under Section 564(b)(1) of the Act, 21 U.S.C.section 360bbb-3(b)(1), unless the authorization is terminated  or revoked sooner.       Influenza A by PCR NEGATIVE NEGATIVE Final   Influenza B by PCR NEGATIVE NEGATIVE Final    Comment: (NOTE) The Xpert Xpress SARS-CoV-2/FLU/RSV plus assay is intended as an aid in the diagnosis of influenza from Nasopharyngeal swab specimens and should not be used as a sole basis for treatment. Nasal washings and aspirates are unacceptable for Xpert Xpress SARS-CoV-2/FLU/RSV testing.  Fact Sheet for Patients: BloggerCourse.com  Fact Sheet for Healthcare Providers: SeriousBroker.it  This  test is not yet approved or cleared by the [place] FDA and has been authorized for detection and/or diagnosis of SARS-CoV-2 by FDA under an Emergency Use Authorization (EUA). This EUA will remain in effect (meaning this test can be used) for the duration of the COVID-19 declaration under Section 564(b)(1) of the Act, 21 U.S.C. section 360bbb-3(b)(1), unless the authorization is terminated or revoked.     Resp Syncytial Virus by PCR NEGATIVE NEGATIVE Final    Comment: (NOTE) Fact Sheet for Patients: BloggerCourse.com  Fact Sheet for Healthcare Providers: SeriousBroker.it  This test is not yet approved or cleared by the [place] FDA and has been authorized for detection and/or diagnosis of SARS-CoV-2 by FDA under an Emergency Use Authorization (EUA). This EUA will remain in effect (meaning this test can be used) for the duration of the COVID-19 declaration under Section 564(b)(1) of the Act, 21 U.S.C. section 360bbb-3(b)(1), unless the authorization is terminated or revoked.  Performed at [facility], [address], [place], [place] [address]   Blood Culture (routine x 2)     Status: Abnormal   Collection Time: 02/13/23 12:49 PM   Specimen: BLOOD  Result Value Ref Range Status   Specimen Description   Final    BLOOD LEFT ANTECUBITAL Performed at [facility], [address], [place], [place] [address]    Special Requests   Final    BOTTLES DRAWN AEROBIC AND ANAEROBIC Blood Culture adequate volume Performed at [facility], [address], [place], [place] [address]    Culture  Setup Time   Final    GRAM POSITIVE RODS IN BOTH AEROBIC AND ANAEROBIC BOTTLES CRITICAL RESULT CALLED TO, READ BACK BY AND VERIFIED WITH: PHARMD NICK G [id number] @1130  BY SM    Culture (A)  Final    LISTERIA MONOCYTOGENES Standardized susceptibility testing for this organism is not  available. HEALTH DEPARTMENT NOTIFIED Performed at [facility], [address] [place], [place] [address]    Report Status 02/16/2023 FINAL  Final  Blood Culture (routine x 2)     Status: Abnormal   Collection Time: 02/13/23 12:55 PM   Specimen: BLOOD  Result Value Ref Range Status   Specimen Description   Final    BLOOD SITE NOT SPECIFIED Performed at [facility], [address], [place], [place] [address]    Special Requests   Final    BOTTLES DRAWN AEROBIC AND ANAEROBIC Blood Culture results may not be optimal due to an excessive volume of blood received in culture bottles Performed at [facility], [address], [place], [place] [address]    Culture  Setup Time   Final    GRAM POSITIVE RODS IN BOTH AEROBIC AND ANAEROBIC BOTTLES CRITICAL VALUE NOTED.  VALUE IS CONSISTENT WITH PREVIOUSLY REPORTED AND CALLED VALUE.    Culture (A)  Final    LISTERIA MONOCYTOGENES Standardized susceptibility testing for this organism is not available. HEALTH DEPARTMENT NOTIFIED Performed at [facility], [address], [place], [place] [address]    Report Status 02/16/2023 FINAL  Final  Culture, blood (Routine X 2) w Reflex to ID Panel     Status: None   Collection Time: 02/15/23 11:06 AM   Specimen: Left Antecubital; Blood  Result Value Ref Range Status   Specimen Description   Final    LEFT ANTECUBITAL BLOOD  Performed at [facility], [address] [place], [place] [address]    Special Requests   Final    AEROBIC BOTTLE ONLY Blood Culture adequate volume Performed at [facility], [address], [place], [place] [address]    Culture   Final    NO GROWTH 5 DAYS Performed at [facility], [address] [place], [place] [address]    Report Status 02/20/2023 FINAL  Final  Culture, blood (Routine X 2) w Reflex to ID Panel     Status: None   Collection Time: 02/15/23 11:06 AM   Specimen: BLOOD LEFT  HAND  Result Value Ref Range Status   Specimen Description   Final    BLOOD LEFT HAND Performed at [facility], [address] [place], [place] [address]    Special Requests   Final    AEROBIC BOTTLE ONLY Blood Culture adequate volume Performed at [facility], [address] [place], [place] [address]    Culture   Final    NO GROWTH 5 DAYS Performed at [facility], [address], [place], [place] [address]    Report Status 02/20/2023 FINAL  Final    Procedures/Studies: CT Angio Abd/Pel w/ and/or w/o  Result Date: 02/19/2023 CLINICAL DATA:  Possible aneurysm leak of the left iliac artery aneurysm. Thoracic and abdominal aortic aneurysms. EXAM: CTA ABDOMEN AND PELVIS WITHOUT AND WITH CONTRAST TECHNIQUE: Multidetector CT imaging of the abdomen and pelvis was performed using the standard protocol during bolus administration of intravenous contrast. Multiplanar reconstructed images and MIPs were obtained and reviewed to evaluate the vascular anatomy. RADIATION DOSE REDUCTION: This exam was performed according to the departmental dose-optimization program which includes automated exposure control, adjustment of the mA and/or kV according to patient size and/or use of iterative reconstruction technique. CONTRAST:  80mL OMNIPAQUE IOHEXOL 350 MG/ML SOLN COMPARISON:  02/19/2023 FINDINGS: VASCULAR Aorta: Descending thoracic aortic aneurysm 6.5 cm in diameter on image 11 series 9. At the hiatus the aorta measures 6.2 cm in diameter. Mural thrombus noted. Just below the level of the renal arteries, there is a 3.8 cm saccular aneurysm extending to the right of the abdominal aorta on image 68 series 9. Just above the inferior mesenteric artery origin there is an anterior 2.1 cm saccular aneurysm on image 75 series 9. On image 8 of series 9, there is a suspected small dissection flap in the descending thoracic aorta which is likely chronic with associated  calcification on image 8 series 9. Similar appearance on CT thoracic spine of 12/29/2021. Celiac: Prominent stenosis at the origin of the celiac artery due to a combination of calcified plaque and median arcuate ligament syndrome. However, the celiac artery is not overtly occluded. There are some moderate collateral vessels between the SMA and celiac artery. SMA: Moderate stenosis due to calcified plaque at the origin of the SMA. No distal SMA filling defect is identified. Renals: Single right renal artery demonstrating at least moderate proximal stenosis due to hard and soft plaque. Similarly there is at least moderate and potentially prominent stenosis of the single left proximal renal artery due to hard and soft plaque. IMA: Patent Inflow: 6.3 cm aneurysm of the left common iliac artery with some internal irregular mural thrombus, abnormal stranding/edema in the soft tissues surrounding the aneurysm, and potentially some intramural hematoma inferiorly where there is faint increase in density along the aneurysm wall for example on image 67 series 5, although  strictly speaking this is somewhat difficult to differentiate from calcification in the wall and/or mural thrombus. From the inferior extent of this aneurysm the external and internal iliac arteries are visible and appear patent although there is high-grade stenosis at the origin of the internal iliac artery. Substantial atheromatous calcification of both iliac arteries. The right common iliac artery demonstrates aneurysmal dilatation but only up to 3.3 cm and without surrounding inflammatory stranding. Extraluminal leak of contrast is not directly visualized on the arterial phase images and delayed phase images do not include the pelvis. Proximal Outflow: External iliac and common femoral artery atherosclerotic vascular calcification without substantial stenosis. Veins: Unremarkable Review of the MIP images confirms the above findings. NON-VASCULAR Lower  chest: Prominent main pulmonary artery at 4.1 cm diameter. Coronary atherosclerosis involving the left main, left anterior descending, circumflex, and right coronary arteries. Moderate cardiomegaly. Passive atelectasis in the left lower lobe related to the thoracic aneurysm. Hepatobiliary: Unremarkable Pancreas: Unremarkable Spleen: Unremarkable Adrenals/Urinary Tract: Simple right renal cysts. Left renal hypodense lesions are probably cysts but technically too small to characterize. No further imaging workup of these lesions is indicated. Adrenal glands unremarkable.  Urinary bladder unremarkable. Stomach/Bowel: Unremarkable Lymphatic: No pathologic adenopathy. Reproductive: Unremarkable Other: Nonspecific presacral edema. Musculoskeletal: Bridging spurring of both sacroiliac joints. Grade 1 degenerative anterolisthesis at L5-S1. IMPRESSION: 1. 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 The appearance is suspicious for leaking left common iliac artery aneurysm indicating contained or incipient rupture. An infected aneurysm is not entirely excluded. Further rupture could be life-threatening. Urgent vascular surgical consultation recommended. 2. Descending thoracic aortic aneurysm 6.5 cm in diameter. Cannot exclude small amount of focal chronic dissection along the upper margin of the thoracic aortic aneurysm. 3. 3.8 cm right lateral saccular aneurysm of the abdominal aorta just below the level of the renal arteries. 4. 2.1 cm anterior saccular aneurysm just above the inferior mesenteric artery origin. 5. 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 6. Prominent stenosis at the origin of the celiac artery due to a combination of calcified plaque and median arcuate ligament syndrome.  However, the celiac artery is not overtly occluded. There are some moderate collateral vessels between the SMA and celiac artery. 7. Moderate stenosis at the origin of the SMA. 8. Moderate stenosis at the origin of both renal arteries. 9. Nonspecific presacral edema. 10. Prominent main pulmonary artery at 4.1 cm diameter, query pulmonary arterial hypertension. 11. Coronary atherosclerosis. 12. Moderate cardiomegaly. 13. Passive atelectasis in the left lower lobe related to the thoracic aneurysm. Aortic Atherosclerosis (ICD10-I70.0). Electronically Signed   By: Gaylyn Rong M.D.   On: 02/19/2023 14:09   CT ABDOMEN PELVIS WO CONTRAST  Addendum Date: 02/19/2023   ADDENDUM REPORT: 02/19/2023 12:18 ADDENDUM: Critical Value/emergent results were called by telephone at the time of interpretation on 02/19/2023 at 12:18 pm to provider RIPUDEEP RAI , who verbally acknowledged these results. Electronically Signed   By: Kennith Center M.D.   On: 02/19/2023 12:18   Result Date: 02/19/2023 CLINICAL DATA:  Abdominal pain. EXAM: CT ABDOMEN AND PELVIS WITHOUT CONTRAST TECHNIQUE: Multidetector CT imaging of the abdomen and pelvis was performed following the standard protocol without IV contrast. RADIATION DOSE REDUCTION: This exam was performed according to the departmental dose-optimization program which includes automated exposure control, adjustment of the mA and/or  kV according to patient size and/or use of iterative reconstruction technique. COMPARISON:  Chest CT 02/17/2023.  06/06/2007 FINDINGS: Lower chest: Atelectasis noted in the dependent lung bases with descending thoracic aortic aneurysm, better characterized on recent chest CT. Hepatobiliary: No suspicious focal abnormality in the liver on this study without intravenous contrast. There is no evidence for gallstones, gallbladder wall thickening, or pericholecystic fluid. No intrahepatic or extrahepatic biliary dilation. Pancreas: No focal mass lesion. No dilatation  of the main duct. No intraparenchymal cyst. No peripancreatic edema. Spleen: No splenomegaly. No focal mass lesion. Adrenals/Urinary Tract: No adrenal nodule or mass. 7.3 cm water density lesion posterior right kidney compatible with simple cyst. Irregular low-density lesion anterior upper pole right kidney at the site of previously demonstrated 3.8 cm simple cyst probably represents involution. No suspicious abnormality in the left kidney. No evidence for hydroureter. The urinary bladder appears normal for the degree of distention. Stomach/Bowel: Stomach is unremarkable. No gastric wall thickening. No evidence of outlet obstruction. Duodenum is normally positioned as is the ligament of Treitz. No small bowel wall thickening. No small bowel dilatation. The appendix is not well visualized, but there is no edema or inflammation in the region of the cecum. No gross colonic mass. No colonic wall thickening. Vascular/Lymphatic: Juxtarenal abdominal aortic aneurysm measures 5.9 x 4.3 cm in maximum orthogonal diameter. Aneurysmal dilatation of the left common iliac artery noted measuring up to 5.8 x 5.7 cm in maximum orthogonal diameter. Aneurysm extends to the level of the iliac bifurcation there is perivascular edema/fluid around the common iliac artery aneurysm without a discrete hematoma. Right common iliac artery measures up to 3.1 cm diameter Reproductive: The prostate gland and seminal vesicles are unremarkable. Other: No intraperitoneal free fluid. Musculoskeletal: No worrisome lytic or sclerotic osseous abnormality. IMPRESSION: 1. 5.8 x 5.7 cm left common iliac artery aneurysm with perivascular edema/fluid around the aneurysm extending into the left extraperitoneal pelvic floor. No discrete hematoma. Findings could reflect aneurysm leak. Infected common iliac artery aneurysm could also have this appearance. 2. Descending thoracic aortic aneurysm better characterized on recent chest CTA. 3. 5.9 x 4.3 cm juxtarenal  abdominal aortic aneurysm. 4. 3.1 cm right common iliac artery aneurysm. 5. Irregular low-density lesion anterior upper pole right kidney at the site of previously demonstrated 3.8 cm simple cyst probably represents involution of that lesion. MRI of the abdomen with and without contrast recommended to further evaluate. Electronically Signed: By: Kennith Center M.D. On: 02/19/2023 12:08   DG Abd 1 View  Result Date: 02/19/2023 CLINICAL DATA:  Pain. EXAM: ABDOMEN - 1 VIEW COMPARISON:  None Available. FINDINGS: Mild diffuse gaseous distention of small bowel and colon is identified in the abdomen. No overt features of bowel obstruction. Degenerative changes noted lumbar spine. SI joints and symphysis pubis unremarkable. IMPRESSION: Mild diffuse gaseous distention of small bowel and colon without overt features of bowel obstruction. Electronically Signed   By: Kennith Center M.D.   On: 02/19/2023 10:19   VAS [place] LOWER EXTREMITY VENOUS (DVT)  Result Date: 02/19/2023  Lower Venous DVT Study Patient Name:  [name]  Date of Exam:   02/18/2023 Medical Rec #: [id number]           Accession #:    [id number] Date of Birth: July 09, 1936           Patient Gender: M Patient Age:   53 years Exam Location:  [facility] Procedure:      VAS [place] LOWER EXTREMITY VENOUS (DVT) Referring Phys:  RIPUDEEP RAI --------------------------------------------------------------------------------  Indications: Edema, positive D-Dimer.  Comparison Study: No previous study, Performing Technologist: McKayla Maag RVT, [place]  Examination Guidelines: A complete evaluation includes B-mode imaging, spectral Doppler, color Doppler, and power Doppler as needed of all accessible portions of each vessel. Bilateral testing is considered an integral part of a complete examination. Limited examinations for reoccurring indications may be performed as noted. The reflux portion of the exam is performed with the patient in reverse Trendelenburg.   +---------+---------------+---------+-----------+----------+--------------+ RIGHT    CompressibilityPhasicitySpontaneityPropertiesThrombus Aging +---------+---------------+---------+-----------+----------+--------------+ CFV      Full           Yes      Yes                                 +---------+---------------+---------+-----------+----------+--------------+ SFJ      Full                                                        +---------+---------------+---------+-----------+----------+--------------+ FV Prox  Full                                                        +---------+---------------+---------+-----------+----------+--------------+ FV Mid   Full                                                        +---------+---------------+---------+-----------+----------+--------------+ FV DistalFull                                                        +---------+---------------+---------+-----------+----------+--------------+ PFV      Full                                                        +---------+---------------+---------+-----------+----------+--------------+ POP      Full           Yes      Yes                                 +---------+---------------+---------+-----------+----------+--------------+ PTV      Full                                                        +---------+---------------+---------+-----------+----------+--------------+ PERO     Full                                                        +---------+---------------+---------+-----------+----------+--------------+   +---------+---------------+---------+-----------+----------+--------------+  LEFT     CompressibilityPhasicitySpontaneityPropertiesThrombus Aging +---------+---------------+---------+-----------+----------+--------------+ CFV      Full           Yes      Yes                                  +---------+---------------+---------+-----------+----------+--------------+ SFJ      Full                                                        +---------+---------------+---------+-----------+----------+--------------+ FV Prox  Full                                                        +---------+---------------+---------+-----------+----------+--------------+ FV Mid   Full                                                        +---------+---------------+---------+-----------+----------+--------------+ FV DistalFull                                                        +---------+---------------+---------+-----------+----------+--------------+ PFV      Full                                                        +---------+---------------+---------+-----------+----------+--------------+ POP      Full           Yes      Yes                                 +---------+---------------+---------+-----------+----------+--------------+ PTV      Full                                                        +---------+---------------+---------+-----------+----------+--------------+ PERO     Full                                                        +---------+---------------+---------+-----------+----------+--------------+     Summary: BILATERAL: - No evidence of deep vein thrombosis seen in the lower extremities, bilaterally. - No evidence of superficial venous thrombosis in the lower extremities, bilaterally. -No evidence of popliteal cyst, bilaterally.   *See table(s) above for measurements and observations. Electronically  signed by Gerarda Fraction on 02/19/2023 at 9:34:44 AM.    Final    CT Angio Chest Pulmonary Embolism (PE) W or WO Contrast  Result Date: 02/17/2023 CLINICAL DATA:  PE suspected, positive D-dimer EXAM: CT ANGIOGRAPHY CHEST WITH CONTRAST TECHNIQUE: Multidetector CT imaging of the chest was performed using the standard protocol during bolus  administration of intravenous contrast. Multiplanar CT image reconstructions and MIPs were obtained to evaluate the vascular anatomy. RADIATION DOSE REDUCTION: This exam was performed according to the departmental dose-optimization program which includes automated exposure control, adjustment of the mA and/or kV according to patient size and/or use of iterative reconstruction technique. CONTRAST:  75mL OMNIPAQUE IOHEXOL 350 MG/ML SOLN COMPARISON:  12/29/2021 FINDINGS: Cardiovascular: Examination for pulmonary embolism is somewhat limited by breath motion artifact. Within this limitation, no evidence of pulmonary embolism through the segmental pulmonary arterial level. Cardiomegaly. Gross enlargement of the main pulmonary artery measuring 4.8 cm in caliber. Three-vessel coronary artery calcifications. No pericardial effusion. Right upper extremity PICC. Aortic atherosclerosis. Large, fusiform aneurysm of the descending thoracic aorta, measuring 6.8 x 6.4 cm, previously 6.3 x 6.0 cm (series 5, image 192, series 7, image 71). Mediastinum/Nodes: No enlarged mediastinal, hilar, or axillary lymph nodes. Thyroid gland, trachea, and esophagus demonstrate no significant findings. Lungs/Pleura: Moderate centrilobular emphysema. No pleural effusion or pneumothorax. Upper Abdomen: No acute abnormality. Musculoskeletal: No chest wall abnormality. No acute osseous findings. Review of the MIP images confirms the above findings. IMPRESSION: 1. Examination for pulmonary embolism is somewhat limited by breath motion artifact. Within this limitation, no evidence of pulmonary embolism through the segmental pulmonary arterial level. 2. Cardiomegaly and coronary artery disease. 3. Gross enlargement of the main pulmonary artery, as can be seen in pulmonary hypertension. 4. Large, fusiform aneurysm of the descending thoracic aorta, measuring 6.8 x 6.4 cm, previously 6.3 x 6.0 cm. Greater than 5 mm growth over the past 12 months is  associated with an increased risk of aneurysm rupture. Recommend cardiothoracic/vascular surgery referral if not already obtained. This recommendation follows 2010 ACCF/AHA/AATS/ACR/ASA/SCA/SCAI/SIR/STS/SVM Guidelines for the Diagnosis and Management of Patients With Thoracic Aortic Disease. Circulation. 2010; 121: Z610-R604. Aortic aneurysm NOS (ICD10-I71.9) 5. Emphysema. Aortic Atherosclerosis (ICD10-I70.0) and Emphysema (ICD10-J43.9). Electronically Signed   By: Jearld Lesch M.D.   On: 02/17/2023 17:50   DG CHEST PORT 1 VIEW  Result Date: 02/17/2023 CLINICAL DATA:  Dyspnea. EXAM: PORTABLE CHEST 1 VIEW COMPARISON:  February 13, 2023. FINDINGS: Stable cardiomegaly. Enlarged descending thoracic aortic aneurysm is again noted. Minimal bibasilar subsegmental atelectasis is noted. Right-sided PICC line is noted with tip in expected position of the SVC. Bony thorax is unremarkable. IMPRESSION: Minimal bibasilar subsegmental atelectasis. Stable appearance of descending thoracic aortic aneurysm. Electronically Signed   By: Lupita Raider M.D.   On: 02/17/2023 16:14   [place] EKG SITE RITE  Result Date: 02/16/2023 If Site Rite image not attached, placement could not be confirmed due to current cardiac rhythm.  US AORTA  Result Date: 02/15/2023 CLINICAL DATA:  Abdominal pain EXAM: ULTRASOUND OF ABDOMINAL AORTA TECHNIQUE: Ultrasound examination of the abdominal aorta and proximal common iliac arteries was performed to evaluate for aneurysm. Additional color and Doppler images of the distal aorta were obtained to document patency. COMPARISON:  MRI from the previous day. FINDINGS: Abdominal aortic measurements as follows: Proximal:  5.9 cm Mid:  5.8 cm Distal:  2.9 cm Patent: Yes, peak systolic velocity is 57.7 cm/s Right common iliac artery: 3.8 cm Left common iliac artery: 6.0 cm IMPRESSION: Abdominal  aortic aneurysm measuring up to 5.9 cm. Additionally there is aneurysmal dilatation of both common iliac arteries worst on  the left dilated to 6 cm. Recommend referral to a vascular specialist. Additionally CTA of the chest, abdomen and pelvis would be helpful for more dedicated evaluation. This recommendation follows ACR consensus guidelines: White Paper of the ACR Incidental Findings Committee II on Vascular Findings. J Am Coll Radiol 2013; 10:789-794. These results will be called to the ordering clinician or representative by the Radiologist Assistant, and communication documented in the PACS or [employer]. Electronically Signed   By: Alcide Clever M.D.   On: 02/15/2023 20:41   ECHOCARDIOGRAM COMPLETE  Result Date: 02/15/2023    ECHOCARDIOGRAM REPORT   Patient Name:   [name] Date of Exam: 02/15/2023 Medical Rec #:  [id number]          Height:       65.0 in Accession #:    [id number]         Weight:       153.0 lb Date of Birth:  12/12/1935          BSA:          1.765 m Patient Age:    86 years           BP:           101/85 mmHg Patient Gender: M                  HR:           91 bpm. Exam Location:  Inpatient Procedure: 2D Echo, Cardiac Doppler and Color Doppler Indications:    Bacteremia  History:        Patient has prior history of Echocardiogram examinations, most                 recent 03/10/2015. COPD and Stroke; Risk Factors:Hypertension,                 Dyslipidemia and Diabetes.  Sonographer:    Milda Smart Referring Phys: [id number] [address]one Nine  Sonographer Comments: Image acquisition challenging due to patient body habitus. IMPRESSIONS  1. Left ventricular ejection fraction, by estimation, is 55 to 60%. The left ventricle has normal function. The left ventricle has no regional wall motion abnormalities. There is moderate concentric left ventricular hypertrophy. Left ventricular diastolic parameters were normal.  2. Right ventricular systolic function is normal. The right ventricular size is normal.  3. The mitral valve is grossly normal. No evidence of mitral valve regurgitation. No evidence of mitral  stenosis.  4. The aortic valve is grossly normal. There is mild calcification of the aortic valve. There is mild thickening of the aortic valve. Aortic valve regurgitation is mild. No aortic stenosis is present.  5. There is borderline dilatation of the ascending aorta, measuring 41 mm.  6. The inferior vena cava is normal in size with greater than 50% respiratory variability, suggesting right atrial pressure of 3 mmHg.  7. Technically difficult study; there is mild AI with thickening of the aortic valve. Consider TEE for further evaluation if clinically indicated. FINDINGS  Left Ventricle: Left ventricular ejection fraction, by estimation, is 55 to 60%. The left ventricle has normal function. The left ventricle has no regional wall motion abnormalities. The left ventricular internal cavity size was normal in size. There is  moderate concentric left ventricular hypertrophy. Left ventricular diastolic parameters were normal. Right Ventricle: The right ventricular size is normal. No increase  in right ventricular wall thickness. Right ventricular systolic function is normal. Left Atrium: Left atrial size was normal in size. Right Atrium: Right atrial size was normal in size. Pericardium: There is no evidence of pericardial effusion. Mitral Valve: The mitral valve is grossly normal. No evidence of mitral valve regurgitation. No evidence of mitral valve stenosis. Tricuspid Valve: The tricuspid valve is normal in structure. Tricuspid valve regurgitation is not demonstrated. No evidence of tricuspid stenosis. Aortic Valve: The aortic valve is grossly normal. There is mild calcification of the aortic valve. There is mild thickening of the aortic valve. Aortic valve regurgitation is mild. Aortic regurgitation PHT measures 307 msec. No aortic stenosis is present. Pulmonic Valve: The pulmonic valve was grossly normal. Pulmonic valve regurgitation is not visualized. No evidence of pulmonic stenosis. Aorta: The aortic root is  normal in size and structure. There is borderline dilatation of the ascending aorta, measuring 41 mm. Venous: The inferior vena cava is normal in size with greater than 50% respiratory variability, suggesting right atrial pressure of 3 mmHg. IAS/Shunts: No atrial level shunt detected by color flow Doppler.  LEFT VENTRICLE PLAX 2D LVIDd:         4.90 cm     Diastology LVIDs:         3.90 cm     LV e' medial:    6.20 cm/s LV PW:         1.40 cm     LV E/e' medial:  10.0 LV IVS:        1.40 cm     LV e' lateral:   8.27 cm/s LVOT diam:     2.40 cm     LV E/e' lateral: 7.5 LV SV:         97 LV SV Index:   55 LVOT Area:     4.52 cm  LV Volumes (MOD) LV vol d, MOD A2C: 58.9 ml LV vol d, MOD A4C: 64.3 ml LV vol s, MOD A2C: 28.9 ml LV vol s, MOD A4C: 31.4 ml LV SV MOD A2C:     30.0 ml LV SV MOD A4C:     64.3 ml LV SV MOD BP:      32.2 ml RIGHT VENTRICLE             IVC RV S prime:     16.80 cm/s  IVC diam: 2.00 cm TAPSE (M-mode): 1.8 cm LEFT ATRIUM             Index        RIGHT ATRIUM           Index LA diam:        4.60 cm 2.61 cm/m   RA Area:     16.30 cm LA Vol (A2C):   32.7 ml 18.52 ml/m  RA Volume:   34.40 ml  19.49 ml/m LA Vol (A4C):   41.6 ml 23.57 ml/m LA Biplane Vol: 37.2 ml 21.07 ml/m  AORTIC VALVE LVOT Vmax:   139.00 cm/s LVOT Vmean:  101.000 cm/s LVOT VTI:    0.214 m AI PHT:      307 msec  AORTA Ao Root diam: 4.00 cm Ao Asc diam:  4.10 cm MITRAL VALVE MV Area (PHT): 3.10 cm    SHUNTS MV Decel Time: 245 msec    Systemic VTI:  0.21 m MV E velocity: 62.10 cm/s  Systemic Diam: 2.40 cm MV A velocity: 77.60 cm/s MV E/A ratio:  0.80 Aditya Sabharwal Electronically signed by Eliezer Lofts  Sabharwal Signature Date/Time: 02/15/2023/11:56:02 AM    Final    MR Lumbar Spine W Wo Contrast  Result Date: 02/15/2023 CLINICAL DATA:  Back pain, infection suspected EXAM: MRI THORACIC AND LUMBAR SPINE WITHOUT AND WITH CONTRAST TECHNIQUE: Multiplanar and multiecho pulse sequences of the thoracic and lumbar spine were obtained  without and with intravenous contrast. CONTRAST:  7mL GADAVIST GADOBUTROL 1 MMOL/ML IV SOLN COMPARISON:  CT thoracic spine 12/29/2021 FINDINGS: MRI THORACIC SPINE FINDINGS Alignment:  Grade 1 anterolisthesis at T9-10 and T10-11. Vertebrae: Chronic wedge compression fracture of T7. T9 hemangioma. No acute fracture or discitis-osteomyelitis. Cord:  Normal Paraspinal and other soft tissues: Known descending thoracic aortic aneurysm. Diameter of 6.2 cm is unchanged. Disc levels: No spinal canal stenosis. MRI LUMBAR SPINE FINDINGS Segmentation:  Standard. Alignment:  Grade 1 anterolisthesis at L5-S1 Vertebrae:  No fracture, evidence of discitis, or bone lesion. Conus medullaris: Extends to the L1 level and appears normal. Paraspinal and other soft tissues: Infrarenal abdominal aortic aneurysm measures 5.0 cm, unchanged Disc levels: L1-L2: Normal disc space and facet joints. No spinal canal stenosis. No neural foraminal stenosis. L2-L3: Normal disc space and facet joints. No spinal canal stenosis. No neural foraminal stenosis. L3-L4: Left asymmetric disc bulge. Mild facet hypertrophy. No spinal canal stenosis. No neural foraminal stenosis. L4-L5: Intermediate sized left asymmetric disc bulge with moderate facet arthrosis. Mild spinal canal stenosis. Moderate left neural foraminal stenosis. L5-S1: Severe facet arthrosis with partial disc uncovering. Mild spinal canal stenosis. No neural foraminal stenosis. Visualized sacrum: Normal. IMPRESSION: 1. No acute abnormality of the thoracic or lumbar spine. 2. Mild spinal canal stenosis and moderate left neural foraminal stenosis at L4-L5. 3. Severe facet arthrosis at L5-S1 with grade 1 anterolisthesis and mild spinal canal stenosis. 4. Unchanged thoracic and abdominal aortic aneurysms compared to 12/29/2021. Electronically Signed   By: Deatra Robinson M.D.   On: 02/15/2023 00:50   MR THORACIC SPINE W WO CONTRAST  Result Date: 02/15/2023 CLINICAL DATA:  Back pain, infection  suspected EXAM: MRI THORACIC AND LUMBAR SPINE WITHOUT AND WITH CONTRAST TECHNIQUE: Multiplanar and multiecho pulse sequences of the thoracic and lumbar spine were obtained without and with intravenous contrast. CONTRAST:  7mL GADAVIST GADOBUTROL 1 MMOL/ML IV SOLN COMPARISON:  CT thoracic spine 12/29/2021 FINDINGS: MRI THORACIC SPINE FINDINGS Alignment:  Grade 1 anterolisthesis at T9-10 and T10-11. Vertebrae: Chronic wedge compression fracture of T7. T9 hemangioma. No acute fracture or discitis-osteomyelitis. Cord:  Normal Paraspinal and other soft tissues: Known descending thoracic aortic aneurysm. Diameter of 6.2 cm is unchanged. Disc levels: No spinal canal stenosis. MRI LUMBAR SPINE FINDINGS Segmentation:  Standard. Alignment:  Grade 1 anterolisthesis at L5-S1 Vertebrae:  No fracture, evidence of discitis, or bone lesion. Conus medullaris: Extends to the L1 level and appears normal. Paraspinal and other soft tissues: Infrarenal abdominal aortic aneurysm measures 5.0 cm, unchanged Disc levels: L1-L2: Normal disc space and facet joints. No spinal canal stenosis. No neural foraminal stenosis. L2-L3: Normal disc space and facet joints. No spinal canal stenosis. No neural foraminal stenosis. L3-L4: Left asymmetric disc bulge. Mild facet hypertrophy. No spinal canal stenosis. No neural foraminal stenosis. L4-L5: Intermediate sized left asymmetric disc bulge with moderate facet arthrosis. Mild spinal canal stenosis. Moderate left neural foraminal stenosis. L5-S1: Severe facet arthrosis with partial disc uncovering. Mild spinal canal stenosis. No neural foraminal stenosis. Visualized sacrum: Normal. IMPRESSION: 1. No acute abnormality of the thoracic or lumbar spine. 2. Mild spinal canal stenosis and moderate left neural foraminal stenosis at L4-L5. 3.  Severe facet arthrosis at L5-S1 with grade 1 anterolisthesis and mild spinal canal stenosis. 4. Unchanged thoracic and abdominal aortic aneurysms compared to 12/29/2021.  Electronically Signed   By: Deatra Robinson M.D.   On: 02/15/2023 00:50   DG Chest Port 1 View  Result Date: 02/13/2023 CLINICAL DATA:  87 year old male with possible sepsis. EXAM: PORTABLE CHEST 1 VIEW COMPARISON:  CTA chest 12/29/2021 and earlier. FINDINGS: Portable AP semi upright view at 1227 hours. Rounded masslike contour abnormality along the left mediastinum was demonstrated to be large Large descending Thoracic Aortic Aneurysm on CTA in April. Stable cardiac size and mediastinal contours. Continued low lung volumes. No superimposed pneumothorax, pulmonary edema, definite pleural effusion, or consolidation. No acute osseous abnormality identified. Negative visible bowel gas. IMPRESSION: 1. Stable radiographic contour of very large Descending Thoracic Aortic Aneurysm demonstrated by CTA in April (approximately 6.2 cm at that time). 2. No new cardiopulmonary abnormality identified. Electronically Signed   By: Odessa Fleming M.D.   On: 02/13/2023 12:43    Labs: BNP (last 3 results) Recent Labs    02/17/23 1517  BNP 303.3*   Basic Metabolic Panel: Recent Labs  Lab 02/16/23 0408 02/17/23 0417 02/18/23 0440 02/21/23 0525 02/22/23 0518  NA 131* 133* 134* 133* 134*  K 4.0 3.8 3.9 3.7 3.8  CL 99 97* 95* 97* 99  CO2 24 27 28 25 25   GLUCOSE 185* 164* 120* 120* 115*  BUN 22 16 15 15 14   CREATININE 1.50* 1.31* 1.50* 1.72* 1.56*  CALCIUM 7.7* 8.4* 8.4* 8.4* 8.6*   Liver Function Tests: Recent Labs  Lab 02/16/23 0408  AST 18  ALT 13  ALKPHOS 49  BILITOT 0.5  PROT 5.9*  ALBUMIN 2.6*   Recent Labs  Lab 02/16/23 0408 02/17/23 0417 02/18/23 0440 02/21/23 0525 02/22/23 0518  WBC 9.6 7.2 5.2 6.9 9.2  HGB 8.8* 9.8* 9.5* 9.1* 9.4*  HCT 28.0* 30.4* 29.0* 28.6* 29.6*  MCV 94.0 93.0 92.7 92.3 92.2  PLT 77* 97* 95* 130* 143*   Recent Labs  Lab 02/21/23 0557 02/21/23 1127 02/21/23 1616 02/21/23 2121 02/22/23 0617  GLUCAP 110* 190* 149* 152* 123*   Urinalysis    Component Value  Date/Time   COLORURINE YELLOW 02/13/2023 1443   APPEARANCEUR CLEAR 02/13/2023 1443   LABSPEC 1.012 02/13/2023 1443   PHURINE 5.0 02/13/2023 1443   GLUCOSEU NEGATIVE 02/13/2023 1443   HGBUR MODERATE (A) 02/13/2023 1443   BILIRUBINUR NEGATIVE 02/13/2023 1443   KETONESUR NEGATIVE 02/13/2023 1443   PROTEINUR 100 (A) 02/13/2023 1443   UROBILINOGEN 1.0 03/02/2012 0521   NITRITE NEGATIVE 02/13/2023 1443   LEUKOCYTESUR NEGATIVE 02/13/2023 1443   Sepsis Labs Recent Labs  Lab 02/17/23 0417 02/18/23 0440 02/21/23 0525 02/22/23 0518  WBC 7.2 5.2 6.9 9.2   Microbiology Recent Results (from the past 240 hour(s))  Resp panel by RT-PCR (RSV, Flu A&B, Covid) Anterior Nasal Swab     Status: None   Collection Time: 02/13/23 12:26 PM   Specimen: Anterior Nasal Swab  Result Value Ref Range Status   SARS Coronavirus 2 by RT PCR NEGATIVE NEGATIVE Final    Comment: (NOTE) SARS-CoV-2 target nucleic acids are NOT DETECTED.  The SARS-CoV-2 RNA is generally detectable in upper respiratory specimens during the acute phase of infection. The lowest concentration of SARS-CoV-2 viral copies this assay can detect is 138 copies/mL. A negative result does not preclude SARS-Cov-2 infection and should not be used as the sole basis for treatment or other patient management decisions.  A negative result may occur with  improper specimen collection/handling, submission of specimen other than nasopharyngeal swab, presence of viral mutation(s) within the areas targeted by this assay, and inadequate number of viral copies(<138 copies/mL). A negative result must be combined with clinical observations, patient history, and epidemiological information. The expected result is Negative.  Fact Sheet for Patients:  BloggerCourse.com  Fact Sheet for Healthcare Providers:  SeriousBroker.it  This test is no t yet approved or cleared by the [place] FDA and  has  been authorized for detection and/or diagnosis of SARS-CoV-2 by FDA under an Emergency Use Authorization (EUA). This EUA will remain  in effect (meaning this test can be used) for the duration of the COVID-19 declaration under Section 564(b)(1) of the Act, 21 U.S.C.section 360bbb-3(b)(1), unless the authorization is terminated  or revoked sooner.       Influenza A by PCR NEGATIVE NEGATIVE Final   Influenza B by PCR NEGATIVE NEGATIVE Final    Comment: (NOTE) The Xpert Xpress SARS-CoV-2/FLU/RSV plus assay is intended as an aid in the diagnosis of influenza from Nasopharyngeal swab specimens and should not be used as a sole basis for treatment. Nasal washings and aspirates are unacceptable for Xpert Xpress SARS-CoV-2/FLU/RSV testing.  Fact Sheet for Patients: BloggerCourse.com  Fact Sheet for Healthcare Providers: SeriousBroker.it  This test is not yet approved or cleared by the [place] FDA and has been authorized for detection and/or diagnosis of SARS-CoV-2 by FDA under an Emergency Use Authorization (EUA). This EUA will remain in effect (meaning this test can be used) for the duration of the COVID-19 declaration under Section 564(b)(1) of the Act, 21 U.S.C. section 360bbb-3(b)(1), unless the authorization is terminated or revoked.     Resp Syncytial Virus by PCR NEGATIVE NEGATIVE Final    Comment: (NOTE) Fact Sheet for Patients: BloggerCourse.com  Fact Sheet for Healthcare Providers: SeriousBroker.it  This test is not yet approved or cleared by the [place] FDA and has been authorized for detection and/or diagnosis of SARS-CoV-2 by FDA under an Emergency Use Authorization (EUA). This EUA will remain in effect (meaning this test can be used) for the duration of the COVID-19 declaration under Section 564(b)(1) of the Act, 21 U.S.C. section 360bbb-3(b)(1), unless the  authorization is terminated or revoked.  Performed at [facility], [address] [place], [place] [address]   Blood Culture (routine x 2)     Status: Abnormal   Collection Time: 02/13/23 12:49 PM   Specimen: BLOOD  Result Value Ref Range Status   Specimen Description   Final    BLOOD LEFT ANTECUBITAL Performed at [facility], [address] [place], [place] [address]    Special Requests   Final    BOTTLES DRAWN AEROBIC AND ANAEROBIC Blood Culture adequate volume Performed at [facility], [address], [place], [place] [address]    Culture  Setup Time   Final    GRAM POSITIVE RODS IN BOTH AEROBIC AND ANAEROBIC BOTTLES CRITICAL RESULT CALLED TO, READ BACK BY AND VERIFIED WITH: PHARMD NICK G [id number] @1130  BY SM    Culture (A)  Final    LISTERIA MONOCYTOGENES Standardized susceptibility testing for this organism is not available. HEALTH DEPARTMENT NOTIFIED Performed at [facility], [address] [place], [place] [address]    Report Status 02/16/2023 FINAL  Final  Blood Culture (routine x 2)     Status: Abnormal   Collection Time: 02/13/23 12:55 PM   Specimen:  BLOOD  Result Value Ref Range Status   Specimen Description   Final    BLOOD SITE NOT SPECIFIED Performed at [facility], [address] [place], [place] [address]    Special Requests   Final    BOTTLES DRAWN AEROBIC AND ANAEROBIC Blood Culture results may not be optimal due to an excessive volume of blood received in culture bottles Performed at [facility], [address] [place], [place] [address]    Culture  Setup Time   Final    GRAM POSITIVE RODS IN BOTH AEROBIC AND ANAEROBIC BOTTLES CRITICAL VALUE NOTED.  VALUE IS CONSISTENT WITH PREVIOUSLY REPORTED AND CALLED VALUE.    Culture (A)  Final    LISTERIA MONOCYTOGENES Standardized susceptibility testing for this organism is not available. HEALTH  DEPARTMENT NOTIFIED Performed at [facility], [address] [place], [place] [address]    Report Status 02/16/2023 FINAL  Final  Culture, blood (Routine X 2) w Reflex to ID Panel     Status: None   Collection Time: 02/15/23 11:06 AM   Specimen: Left Antecubital; Blood  Result Value Ref Range Status   Specimen Description   Final    LEFT ANTECUBITAL BLOOD Performed at [facility], [address] [place], [place] [address]    Special Requests   Final    AEROBIC BOTTLE ONLY Blood Culture adequate volume Performed at [facility], [address], [place], [place] [address]    Culture   Final    NO GROWTH 5 DAYS Performed at [facility], [address] [place], [place] [address]    Report Status 02/20/2023 FINAL  Final  Culture, blood (Routine X 2) w Reflex to ID Panel     Status: None   Collection Time: 02/15/23 11:06 AM   Specimen: BLOOD LEFT HAND  Result Value Ref Range Status   Specimen Description   Final    BLOOD LEFT HAND Performed at [facility], [address], [place], [place] [address]    Special Requests   Final    AEROBIC BOTTLE ONLY Blood Culture adequate volume Performed at [facility], [address], [place], [place] [address]    Culture   Final    NO GROWTH 5 DAYS Performed at [facility], [address], [place], [place] [address]    Report Status 02/20/2023 FINAL  Final     Time coordinating discharge: 35 minutes  SIGNED: Lanae Boast, MD  [facility] 02/22/2023, 8:35 AM  If 7PM-7AM, please contact night-coverage [email or web address]

## 2023-02-22 NOTE — Plan of Care (Signed)

## 2023-02-23 ENCOUNTER — Telehealth: Payer: Self-pay

## 2023-02-23 DIAGNOSIS — A329 Listeriosis, unspecified: Secondary | ICD-10-CM | POA: Diagnosis not present

## 2023-02-23 DIAGNOSIS — J449 Chronic obstructive pulmonary disease, unspecified: Secondary | ICD-10-CM | POA: Diagnosis not present

## 2023-02-23 DIAGNOSIS — E1122 Type 2 diabetes mellitus with diabetic chronic kidney disease: Secondary | ICD-10-CM | POA: Diagnosis not present

## 2023-02-23 DIAGNOSIS — I712 Thoracic aortic aneurysm, without rupture, unspecified: Secondary | ICD-10-CM | POA: Diagnosis not present

## 2023-02-23 DIAGNOSIS — D631 Anemia in chronic kidney disease: Secondary | ICD-10-CM | POA: Diagnosis not present

## 2023-02-23 DIAGNOSIS — N401 Enlarged prostate with lower urinary tract symptoms: Secondary | ICD-10-CM | POA: Diagnosis not present

## 2023-02-23 DIAGNOSIS — I129 Hypertensive chronic kidney disease with stage 1 through stage 4 chronic kidney disease, or unspecified chronic kidney disease: Secondary | ICD-10-CM | POA: Diagnosis not present

## 2023-02-23 DIAGNOSIS — D696 Thrombocytopenia, unspecified: Secondary | ICD-10-CM | POA: Diagnosis not present

## 2023-02-23 DIAGNOSIS — N1831 Chronic kidney disease, stage 3a: Secondary | ICD-10-CM | POA: Diagnosis not present

## 2023-02-23 NOTE — Telephone Encounter (Signed)
[name] at [phone] Dr Jimmey Ralph agreed to provider will follow [facility] orders and Hospice after finishing antibiotics.

## 2023-02-23 NOTE — Transitions of Care (Post Inpatient/ED Visit) (Signed)
   02/23/2023  Name: [name] MRN: [id number] DOB: 1935-12-10  Today's TOC FU Call Status: Today's TOC FU Call Status:: Unsuccessul Call (1st Attempt) Unsuccessful Call (1st Attempt) Date: 02/23/23  Attempted to reach the patient regarding the most recent Inpatient/ED visit.  Follow Up Plan: Additional outreach attempts will be made to reach the patient to complete the Transitions of Care (Post Inpatient/ED visit) call.     Antionette Fairy, RN,BSN,CCM [facility] Care Management Care Management Community Coordinator Direct Phone: [phone] Toll Free: [phone] Fax: [phone]

## 2023-02-24 ENCOUNTER — Encounter: Payer: Self-pay | Admitting: Family Medicine

## 2023-02-24 ENCOUNTER — Telehealth: Payer: Self-pay

## 2023-02-24 ENCOUNTER — Telehealth: Payer: Self-pay | Admitting: Family Medicine

## 2023-02-24 NOTE — Transitions of Care (Post Inpatient/ED Visit) (Signed)
   02/24/2023  Name: Jamesyn Lindell MRN: [id number] DOB: Sep 11, 1936  Today's TOC FU Call Status: Today's TOC FU Call Status:: Unsuccessful Call (3rd Attempt) Unsuccessful Call (3rd Attempt) Date: 02/24/23  Attempted to reach the patient regarding the most recent Inpatient/ED visit.  Follow Up Plan: No further outreach attempts will be made at this time. We have been unable to contact the patient.    Antionette Fairy, RN,BSN,CCM [facility] Care Management Care Management Community Coordinator Direct Phone: [phone] Toll Free: [phone] Fax: [phone]

## 2023-02-24 NOTE — Telephone Encounter (Signed)
Home Health Verbal Orders  Agency:  [facility]  Caller: Karma Ganja, [place] [phone]  Requesting OT/ PT/ Skilled nursing/ Social Work/ Speech:  Skilled Nursing   Reason for Request:  Patient is getting IV antibiotics and they are the [facility] that is taking care of this   Frequency:  2 x 2 weeks, 1 x 1 week due to abx therapy ending on 6/25  [facility] needs F2F w/in last 30 days

## 2023-02-24 NOTE — Telephone Encounter (Signed)
Left message to return call to our office at their convenience.  

## 2023-02-24 NOTE — Telephone Encounter (Signed)
Please advise 

## 2023-02-24 NOTE — Telephone Encounter (Signed)
Ok with me. Please place any necessary orders. 

## 2023-02-24 NOTE — Transitions of Care (Post Inpatient/ED Visit) (Signed)
   02/24/2023  Name: [name] MRN: [id number] DOB: Sep 18, 1935  Today's TOC FU Call Status: Today's TOC FU Call Status:: Unsuccessful Call (2nd Attempt) Unsuccessful Call (2nd Attempt) Date: 02/24/23  Attempted to reach the patient regarding the most recent Inpatient/ED visit.  Follow Up Plan: Additional outreach attempts will be made to reach the patient to complete the Transitions of Care (Post Inpatient/ED visit) call.     Antionette Fairy, RN,BSN,CCM [facility] Care Management Care Management Community Coordinator Direct Phone: [phone] Toll Free: [phone] Fax: [phone]

## 2023-02-25 DIAGNOSIS — I129 Hypertensive chronic kidney disease with stage 1 through stage 4 chronic kidney disease, or unspecified chronic kidney disease: Secondary | ICD-10-CM | POA: Diagnosis not present

## 2023-02-25 DIAGNOSIS — D631 Anemia in chronic kidney disease: Secondary | ICD-10-CM | POA: Diagnosis not present

## 2023-02-25 DIAGNOSIS — N1831 Chronic kidney disease, stage 3a: Secondary | ICD-10-CM | POA: Diagnosis not present

## 2023-02-25 DIAGNOSIS — I712 Thoracic aortic aneurysm, without rupture, unspecified: Secondary | ICD-10-CM | POA: Diagnosis not present

## 2023-02-25 DIAGNOSIS — J449 Chronic obstructive pulmonary disease, unspecified: Secondary | ICD-10-CM | POA: Diagnosis not present

## 2023-02-25 DIAGNOSIS — E1122 Type 2 diabetes mellitus with diabetic chronic kidney disease: Secondary | ICD-10-CM | POA: Diagnosis not present

## 2023-02-25 DIAGNOSIS — N401 Enlarged prostate with lower urinary tract symptoms: Secondary | ICD-10-CM | POA: Diagnosis not present

## 2023-02-25 DIAGNOSIS — A329 Listeriosis, unspecified: Secondary | ICD-10-CM | POA: Diagnosis not present

## 2023-02-25 DIAGNOSIS — D696 Thrombocytopenia, unspecified: Secondary | ICD-10-CM | POA: Diagnosis not present

## 2023-02-28 NOTE — Telephone Encounter (Signed)
Need office visit  Patient has video visit on 03/01/2023

## 2023-02-28 NOTE — Telephone Encounter (Signed)
Patient would like an update on previous message.

## 2023-03-01 ENCOUNTER — Telehealth (INDEPENDENT_AMBULATORY_CARE_PROVIDER_SITE_OTHER): Payer: Medicare HMO | Admitting: Family Medicine

## 2023-03-01 ENCOUNTER — Encounter: Payer: Self-pay | Admitting: Family Medicine

## 2023-03-01 VITALS — Ht 65.0 in

## 2023-03-01 DIAGNOSIS — I712 Thoracic aortic aneurysm, without rupture, unspecified: Secondary | ICD-10-CM | POA: Diagnosis not present

## 2023-03-01 DIAGNOSIS — I7123 Aneurysm of the descending thoracic aorta, without rupture: Secondary | ICD-10-CM | POA: Diagnosis not present

## 2023-03-01 DIAGNOSIS — A329 Listeriosis, unspecified: Secondary | ICD-10-CM | POA: Diagnosis not present

## 2023-03-01 DIAGNOSIS — D631 Anemia in chronic kidney disease: Secondary | ICD-10-CM | POA: Diagnosis not present

## 2023-03-01 DIAGNOSIS — K5900 Constipation, unspecified: Secondary | ICD-10-CM | POA: Diagnosis not present

## 2023-03-01 DIAGNOSIS — D696 Thrombocytopenia, unspecified: Secondary | ICD-10-CM | POA: Diagnosis not present

## 2023-03-01 DIAGNOSIS — E1122 Type 2 diabetes mellitus with diabetic chronic kidney disease: Secondary | ICD-10-CM | POA: Diagnosis not present

## 2023-03-01 DIAGNOSIS — N1831 Chronic kidney disease, stage 3a: Secondary | ICD-10-CM | POA: Diagnosis not present

## 2023-03-01 DIAGNOSIS — N401 Enlarged prostate with lower urinary tract symptoms: Secondary | ICD-10-CM | POA: Diagnosis not present

## 2023-03-01 DIAGNOSIS — J449 Chronic obstructive pulmonary disease, unspecified: Secondary | ICD-10-CM | POA: Diagnosis not present

## 2023-03-01 DIAGNOSIS — I129 Hypertensive chronic kidney disease with stage 1 through stage 4 chronic kidney disease, or unspecified chronic kidney disease: Secondary | ICD-10-CM | POA: Diagnosis not present

## 2023-03-01 MED ORDER — TRAMADOL HCL 50 MG PO TABS: 50.0000 mg | ORAL_TABLET | Freq: Three times a day (TID) | ORAL | 0 refills | Status: AC | PRN

## 2023-03-01 MED ORDER — BACLOFEN 10 MG PO TABS: 10.0000 mg | ORAL_TABLET | Freq: Three times a day (TID) | ORAL | 3 refills | Status: DC

## 2023-03-01 MED ORDER — LINACLOTIDE 145 MCG PO CAPS: 145.0000 ug | ORAL_CAPSULE | Freq: Every day | ORAL | 0 refills | Status: DC

## 2023-03-01 NOTE — Progress Notes (Signed)
Chief Complaint:  [name] is a 87 y.o. male who presents today for a virtual TCM visit.  Assessment/Plan:  New/Acute Problems: Listeriosis  Improving with IV ampicillin.   He will complete 1 more week of IV antibiotics and follow-up with ID in a couple of weeks.  Back pain Likely multifactorial in setting of worsening iliac aneurysms and degenerative disc disease and lumbar spine.  An MRI spine during hospitalization that confirmed lumbar spondylosis and spinal canal stenosis. They are interested in pursuing a palliative route as below.  We discussed treatment options.  We will start baclofen and tramadol.  We did discuss potential side effects.  They can follow-up with me in a few days and we can titrate meds as needed.  Chronic Problems Addressed Today: Descending thoracic aortic aneurysm [facility]) Vascular surgery was consulted during recent hospitalization and patient was not felt to be a surgical candidate.  They are interested in pursuing hospice or palliative care after he completes his course of IV antibiotics.  They have already established contact with the care team during hospitalization and will call to schedule soon.  They will let us know if they need any further assistance.  Constipation Multifactorial in setting of recent severe illness as well as medication induced.  I have not had much success with over-the-counter laxatives or Senokot.  They are using Fleet enemas but would like to start a bowel regimen to help with this.  We will start Linzess.  We did discuss potential side effects.  They can follow-up with [place] in a few days if not improving.      Subjective:  HPI:  Summary of Hospital admission: Reason for admission: Listeriosis, AAA Date of admission: 02/13/2023 Date of discharge: 02/22/2023 Date of Interactive contact: 3 attempts were made on 6/12 and 6/13 Summary of Hospital course: Patient presented to the ED on 02/13/2023 with fevers, chills, and  fatigue.  Found to be hypoxic and had elevated WBCs.  He was started on treatment empirically for pneumonia.  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  ID was consulted and he was started on IV ampicillin.  He subsequently developed abdominal pain.  CT scan was obtained which showed possible aneurysm leak of left iliac artery.  Also found to have thoracic aortic aneurysm with small amount of chronic dissection. Vascular surgery was consulted however he was not felt to be a surgical candidate and was recommended to pursue palliative care.  Family wished to complete course of antibiotics before transitioning to hospice.  PICC line was placed and he was discharged home with IV antibiotics with plan to continue ampicillin until 6/25.  Interim history:  He has been home for the last week. He is still having a lot of back pain.  This is significantly impacting his ability to get up to move around and perform activities of daily living.  He was not discharged home with any pain medications and may request pain meds be sent in today.  Pain is primarily located in right lower back.  They are concerned that it may be due to a bulging disc in his back.  Denies any weakness or numbness.  Pain has been persistent and stable however not controlled for the last several days.  Overall feels like he is improving with antibiotics.  Has upcoming appointment with ID in a few weeks.   ROS: Per HPI, otherwise a complete review of systems was negative.   PMH:  The following were reviewed and entered/updated in epic: Past  Medical History:  Diagnosis Date   BPH (benign prostatic hyperplasia)    COPD (chronic obstructive pulmonary disease) (HCC)    CVA (cerebral vascular accident) (HCC)    Diabetes mellitus without complication (HCC)    Dizziness and giddiness 07/09/2015   Hyperlipidemia    Hypertension    Renal disorder    Patient Active Problem List   Diagnosis Date Noted   Constipation 03/01/2023   Allergic rhinitis  01/20/2023   Leg edema 12/21/2022   Descending thoracic aortic aneurysm (HCC) 03/29/2022   AAA (abdominal aortic aneurysm) (HCC) 03/29/2022   Vitamin B12 deficiency 11/30/2021   Vitamin D deficiency 11/30/2021   Pre-diabetes 02/14/2020   Thrombocytopenia (HCC) 12/12/2018   Chronic obstructive pulmonary disease (HCC) 11/14/2014   Benign prostatic hyperplasia with nocturia 11/14/2014   Primary osteoarthritis involving multiple joints 11/14/2014   Former heavy tobacco smoker 12/04/2013   Normocytic anemia 03/02/2012   CVA (cerebral vascular accident) (HCC) 03/01/2012   HTN (hypertension) 03/01/2012   Hyperlipidemia 03/01/2012   Stage 3a chronic kidney disease (HCC) 03/01/2012   Past Surgical History:  Procedure Laterality Date   APPENDECTOMY     TEE WITHOUT CARDIOVERSION  03/10/2012   Procedure: TRANSESOPHAGEAL ECHOCARDIOGRAM (TEE);  Surgeon: Ricki Rodriguez, MD;  Location: [facility] ENDOSCOPY;  Service: Cardiovascular;  Laterality: N/A;    Family History  Problem Relation Age of Onset   Heart attack Father    Cancer Mother    Cancer Sister        brain tumor   Heart attack Brother    Heart attack Brother    Heart disease Brother     Medications- Reconciled discharge and current medications in Epic.  Current Outpatient Medications  Medication Sig Dispense Refill   baclofen (LIORESAL) 10 MG tablet Take 1 tablet (10 mg total) by mouth 3 (three) times daily. 90 each 3   linaclotide (LINZESS) 145 MCG CAPS capsule Take 1 capsule (145 mcg total) by mouth daily before breakfast. 30 capsule 0   traMADol (ULTRAM) 50 MG tablet Take 1 tablet (50 mg total) by mouth every 8 (eight) hours as needed for up to 5 days. 15 tablet 0   acetaminophen (TYLENOL) 500 MG tablet Take 1,000 mg by mouth every 6 (six) hours as needed for fever or mild pain.     ampicillin IVPB Inject 8 g into the vein daily for 20 days. As a continuous infusion. Indication:  Listeria bacteremia First Dose: Yes Last Day of  Therapy:  03/08/23 Labs - Once weekly:  CBC/D and BMP, Labs - Once weekly: ESR and CRP Method of administration: Ambulatory Pump (Continuous Infusion) Method of administration may be changed at the discretion of home infusion pharmacist based upon assessment of the patient and/or caregiver's ability to self-administer the medication ordered. 20 Units 0   aspirin EC 81 MG tablet Take 1 tablet (81 mg total) by mouth daily. 90 tablet 1   azelastine (ASTELIN) 0.1 % nasal spray Place 2 sprays into both nostrils 2 (two) times daily. 30 mL 12   Calcium Carb-Cholecalciferol (CALCIUM 600+D) 600-20 MG-MCG TABS Take 1 tablet by mouth daily.     lidocaine (LIDODERM) 5 % Place 1 patch onto the skin daily. Remove & Discard patch within 12 hours or as directed by MD 30 patch 0   melatonin 5 MG TABS Take 1 tablet (5 mg total) by mouth at bedtime as needed for up to 14 days. 14 tablet 0   Methylcobalamin (B-12) 1000 MCG TBDP Take 1,000 mg  by mouth daily.     montelukast (SINGULAIR) 10 MG tablet Take 1 tablet (10 mg total) by mouth at bedtime. 30 tablet 3   Omega-3 Fatty Acids (FISH OIL) 1000 MG CAPS Take 1 capsule by mouth daily.     rosuvastatin (CRESTOR) 40 MG tablet Take 1 tablet (40 mg total) by mouth daily. 90 tablet 3   senna-docusate (SENOKOT-S) 8.6-50 MG tablet Take 1 tablet by mouth 2 (two) times daily for 14 days. 28 tablet 0   No current facility-administered medications for this visit.    Allergies-reviewed and updated Allergies  Allergen Reactions   Ace Inhibitors     cough    Social History   Socioeconomic History   Marital status: Married    Spouse name: Not on file   Number of children: 6   Years of education: HS   Highest education level: Not on file  Occupational History   Occupation: retired  Tobacco Use   Smoking status: Former   Smokeless tobacco: Never  Substance and Sexual Activity   Alcohol use: Yes    Alcohol/week: 0.0 standard drinks of alcohol    Comment:  occasionally   Drug use: No   Sexual activity: Not on file  Other Topics Concern   Not on file  Social History Narrative   Patient drinks caffeine occasionally.   Patient is right handed.    Social Determinants of Health   Financial Resource Strain: Low Risk  (05/27/2022)   Overall Financial Resource Strain (CARDIA)    Difficulty of Paying Living Expenses: Not hard at all  Food Insecurity: No Food Insecurity (02/13/2023)   Hunger Vital Sign    Worried About Running Out of Food in the Last Year: Never true    Ran Out of Food in the Last Year: Never true  Transportation Needs: No Transportation Needs (02/13/2023)   PRAPARE - Administrator, Civil Service (Medical): No    Lack of Transportation (Non-Medical): No  Physical Activity: Inactive (05/27/2022)   Exercise Vital Sign    Days of Exercise per Week: 0 days    Minutes of Exercise per Session: 0 min  Stress: No Stress Concern Present (05/27/2022)   [employer] of Occupational Health - Occupational Stress Questionnaire    Feeling of Stress : Not at all  Social Connections: Moderately Isolated (05/27/2022)   Social Connection and Isolation Panel [NHANES]    Frequency of Communication with Friends and Family: More than three times a week    Frequency of Social Gatherings with Friends and Family: Three times a week    Attends Religious Services: Never    Active Member of Clubs or Organizations: No    Attends Banker Meetings: Never    Marital Status: Married        Objective:  Physical Exam: Ht 5\' 5"  (1.651 m)   BMI 25.46 kg/m   Gen: NAD, resting comfortably in bed Neuro: Grossly normal, moves all extremities Psych: Normal affect and thought content   Virtual Visit via Video   I connected with [name] on 03/01/23 at 10:20 AM EDT by a video enabled telemedicine application and verified that I am speaking with the correct person using two identifiers. The limitations of evaluation and  management by telemedicine and the availability of in person appointments were discussed. The patient expressed understanding and agreed to proceed.   Patient location: Home Provider location: [place] [employer] Persons participating in the virtual visit: Myself, Patient, and  several of his family members      Katina Degree. Jimmey Ralph, MD 03/01/2023 10:41 AM

## 2023-03-01 NOTE — Assessment & Plan Note (Signed)
Vascular surgery was consulted during recent hospitalization and patient was not felt to be a surgical candidate.  They are interested in pursuing hospice or palliative care after he completes his course of IV antibiotics.  They have already established contact with the care team during hospitalization and will call to schedule soon.  They will let us know if they need any further assistance.

## 2023-03-01 NOTE — Assessment & Plan Note (Signed)
Multifactorial in setting of recent severe illness as well as medication induced.  I have not had much success with over-the-counter laxatives or Senokot.  They are using Fleet enemas but would like to start a bowel regimen to help with this.  We will start Linzess.  We did discuss potential side effects.  They can follow-up with [place] in a few days if not improving.

## 2023-03-02 ENCOUNTER — Encounter: Payer: Self-pay | Admitting: Internal Medicine

## 2023-03-02 LAB — LAB REPORT - SCANNED: EGFR: 54

## 2023-03-03 ENCOUNTER — Encounter: Payer: Self-pay | Admitting: Family Medicine

## 2023-03-03 ENCOUNTER — Telehealth: Payer: Self-pay | Admitting: Family Medicine

## 2023-03-03 NOTE — Telephone Encounter (Signed)
Spoke with patient, stated his back is in pain, was unable to sleep due to pain, requesting  Increased in pain medication   Please advise

## 2023-03-03 NOTE — Telephone Encounter (Addendum)
Pt's daughter in law called to check on this request, informed her with Dr. Lavone Neri note below. Scheduled pt for 6/24 at 1:40 to discuss hospice forms.

## 2023-03-03 NOTE — Telephone Encounter (Signed)
They can try doubling the tramadol to 100 mg twice daily.  [name]. Jimmey Ralph, MD 03/03/2023 12:21 PM

## 2023-03-03 NOTE — Telephone Encounter (Signed)
Patient requests to be called asap to discuss increase of pain medication. Unable to schedule OV.

## 2023-03-04 ENCOUNTER — Ambulatory Visit: Payer: Medicare HMO | Admitting: Family Medicine

## 2023-03-04 DIAGNOSIS — I129 Hypertensive chronic kidney disease with stage 1 through stage 4 chronic kidney disease, or unspecified chronic kidney disease: Secondary | ICD-10-CM | POA: Diagnosis not present

## 2023-03-04 DIAGNOSIS — N401 Enlarged prostate with lower urinary tract symptoms: Secondary | ICD-10-CM | POA: Diagnosis not present

## 2023-03-04 DIAGNOSIS — I712 Thoracic aortic aneurysm, without rupture, unspecified: Secondary | ICD-10-CM | POA: Diagnosis not present

## 2023-03-04 DIAGNOSIS — D696 Thrombocytopenia, unspecified: Secondary | ICD-10-CM | POA: Diagnosis not present

## 2023-03-04 DIAGNOSIS — J449 Chronic obstructive pulmonary disease, unspecified: Secondary | ICD-10-CM | POA: Diagnosis not present

## 2023-03-04 DIAGNOSIS — D631 Anemia in chronic kidney disease: Secondary | ICD-10-CM | POA: Diagnosis not present

## 2023-03-04 DIAGNOSIS — A329 Listeriosis, unspecified: Secondary | ICD-10-CM | POA: Diagnosis not present

## 2023-03-04 DIAGNOSIS — E1122 Type 2 diabetes mellitus with diabetic chronic kidney disease: Secondary | ICD-10-CM | POA: Diagnosis not present

## 2023-03-04 DIAGNOSIS — N1831 Chronic kidney disease, stage 3a: Secondary | ICD-10-CM | POA: Diagnosis not present

## 2023-03-05 DIAGNOSIS — A329 Listeriosis, unspecified: Secondary | ICD-10-CM | POA: Diagnosis not present

## 2023-03-07 ENCOUNTER — Encounter: Payer: Self-pay | Admitting: Family Medicine

## 2023-03-07 ENCOUNTER — Ambulatory Visit (INDEPENDENT_AMBULATORY_CARE_PROVIDER_SITE_OTHER): Payer: Medicare HMO | Admitting: Family Medicine

## 2023-03-07 VITALS — BP 119/79 | HR 104 | Temp 97.5°F | Ht 65.0 in | Wt 149.8 lb

## 2023-03-07 DIAGNOSIS — A329 Listeriosis, unspecified: Secondary | ICD-10-CM | POA: Diagnosis not present

## 2023-03-07 DIAGNOSIS — I129 Hypertensive chronic kidney disease with stage 1 through stage 4 chronic kidney disease, or unspecified chronic kidney disease: Secondary | ICD-10-CM | POA: Diagnosis not present

## 2023-03-07 DIAGNOSIS — E1122 Type 2 diabetes mellitus with diabetic chronic kidney disease: Secondary | ICD-10-CM | POA: Diagnosis not present

## 2023-03-07 DIAGNOSIS — N1831 Chronic kidney disease, stage 3a: Secondary | ICD-10-CM | POA: Diagnosis not present

## 2023-03-07 DIAGNOSIS — N401 Enlarged prostate with lower urinary tract symptoms: Secondary | ICD-10-CM | POA: Diagnosis not present

## 2023-03-07 DIAGNOSIS — M549 Dorsalgia, unspecified: Secondary | ICD-10-CM

## 2023-03-07 DIAGNOSIS — I712 Thoracic aortic aneurysm, without rupture, unspecified: Secondary | ICD-10-CM | POA: Diagnosis not present

## 2023-03-07 DIAGNOSIS — K5900 Constipation, unspecified: Secondary | ICD-10-CM | POA: Diagnosis not present

## 2023-03-07 DIAGNOSIS — J449 Chronic obstructive pulmonary disease, unspecified: Secondary | ICD-10-CM | POA: Diagnosis not present

## 2023-03-07 DIAGNOSIS — D631 Anemia in chronic kidney disease: Secondary | ICD-10-CM | POA: Diagnosis not present

## 2023-03-07 DIAGNOSIS — D696 Thrombocytopenia, unspecified: Secondary | ICD-10-CM | POA: Diagnosis not present

## 2023-03-07 MED ORDER — BACLOFEN 20 MG PO TABS: 20.0000 mg | ORAL_TABLET | Freq: Three times a day (TID) | ORAL | 3 refills | Status: DC

## 2023-03-07 MED ORDER — HYDROCODONE-ACETAMINOPHEN 5-325 MG PO TABS: 1.0000 | ORAL_TABLET | Freq: Four times a day (QID) | ORAL | 0 refills | Status: DC | PRN

## 2023-03-07 MED ORDER — LINACLOTIDE 290 MCG PO CAPS: 290.0000 ug | ORAL_CAPSULE | Freq: Every day | ORAL | 5 refills | Status: DC

## 2023-03-07 NOTE — Telephone Encounter (Signed)
Patient had OV today. 

## 2023-03-07 NOTE — Assessment & Plan Note (Signed)
Multifactorial.  Not much improvement as of yet.  Will increase Linzess to 290 mcg daily.  Also recommended daily MiraLAX.  They will continue using Fleet enemas as needed as well.  Discussed importance of full bowel cleanout.  Once his constipation is resolved he can continue taking MiraLAX daily to prevent recurrence especially since he is now on narcotics for his low back pain.

## 2023-03-07 NOTE — Telephone Encounter (Signed)
Please see previous telephone note.  Katina Degree. Jimmey Ralph, MD 03/07/2023 9:06 AM

## 2023-03-07 NOTE — Progress Notes (Signed)
I have the forms with him  [name] is a 87 y.o. male who presents today for an office visit.  Assessment/Plan:  New/Acute Problems: Listeriosis Will be finishing his course of antibiotics today.  Follows up with ID next week.  No signs of recurrence.  Chronic Problems Addressed Today: Back pain Multifactorial though was recently found to have lumbar spondylosis.  He also does have aortic and iliac aneurysms that are nonoperable and he is pursuing palliative care.  Main goal at this point is comfort.  He has not had much improvement with tramadol.  We will increase to hydrocodone 5-3 25 every 6 hours as needed.  They can also continue with baclofen.  They can take up to 20 mg three times daily as needed.  They will follow-up with me in a week or two.  They are working with palliative and will be transitioning to hospice when he finished his IV antibiotics.   Constipation Multifactorial.  Not much improvement as of yet.  Will increase Linzess to 290 mcg daily.  Also recommended daily MiraLAX.  They will continue using Fleet enemas as needed as well.  Discussed importance of full bowel cleanout.  Once his constipation is resolved he can continue taking MiraLAX daily to prevent recurrence especially since he is now on narcotics for his low back pain.     Subjective:  HPI:  See Assessment / plan for status of chronic conditions.    Patient is here today with family.  Main concern today is back pain and constipation.  We saw him virtually last week for hospital follow-up for sepsis secondary to listeriosis.  At that time we also discussed his back pain, constipation, and thoracic aortic aneurysm.  They are pursuing had a care however are waiting on him to finish his course of IV antibiotics before pursuing this.  At her last visit we started tramadol and baclofen.  Also started Linzess.  He has not had much improvement with this.  They have been using enema to help with constipation  which does help.  He will be finishing his course of IV ampicillin today and will be following up with ID next week.       Objective:  Physical Exam: BP 119/79   Pulse (!) 104   Temp (!) 97.5 F (36.4 C) (Temporal)   Ht 5\' 5"  (1.651 m)   Wt 149 lb 12.8 oz (67.9 kg)   SpO2 99%   BMI 24.93 kg/m   Gen: No acute distress, resting comfortably Neuro: Grossly normal, moves all extremities Psych: Normal affect and thought content      Jazalynn Mireles M. Jimmey Ralph, MD 03/07/2023 2:13 PM

## 2023-03-07 NOTE — Patient Instructions (Signed)
It was very nice to see you today!  Please increase the Linzess to 290 mcg daily.  Please take a daily dose of MiraLAX.  You can also continue with the daily antibiotic.  The goal is to have 1-2 soft bowel movements daily.  It may take several days of the above therapy before your bowel movements normalize.  What your bowel movements normalize please continue taking the MiraLAX daily as you will be prone to having recurrence of constipation with the pain medications.  We will increase your back pain.  Will switch her tramadol to hydrocodone.  Return in about 1 week (around 03/14/2023).   Take care, Dr Jimmey Ralph  PLEASE NOTE:  If you had any lab tests, please let us know if you have not heard back within a few days. You may see your results on mychart before we have a chance to review them but we will give you a call once they are reviewed by [place].   If we ordered any referrals today, please let us know if you have not heard from their office within the next week.   If you had any urgent prescriptions sent in today, please check with the pharmacy within an hour of our visit to make sure the prescription was transmitted appropriately.   Please try these tips to maintain a healthy lifestyle:  Eat at least 3 REAL meals and 1-2 snacks per day.  Aim for no more than 5 hours between eating.  If you eat breakfast, please do so within one hour of getting up.   Each meal should contain half fruits/vegetables, one quarter protein, and one quarter carbs (no bigger than a computer mouse)  Cut down on sweet beverages. This includes juice, soda, and sweet tea.   Drink at least 1 glass of water with each meal and aim for at least 8 glasses per day  Exercise at least 150 minutes every week.

## 2023-03-07 NOTE — Assessment & Plan Note (Signed)
Multifactorial though was recently found to have lumbar spondylosis.  He also does have aortic and iliac aneurysms that are nonoperable and he is pursuing palliative care.  Main goal at this point is comfort.  He has not had much improvement with tramadol.  We will increase to hydrocodone 5-3 25 every 6 hours as needed.  They can also continue with baclofen.  They can take up to 20 mg three times daily as needed.  They will follow-up with me in a week or two.  They are working with palliative and will be transitioning to hospice when he finished his IV antibiotics.

## 2023-03-08 ENCOUNTER — Encounter: Payer: Self-pay | Admitting: Family Medicine

## 2023-03-08 NOTE — Telephone Encounter (Signed)
Please advise 

## 2023-03-09 ENCOUNTER — Telehealth: Payer: Self-pay | Admitting: Family Medicine

## 2023-03-09 DIAGNOSIS — A329 Listeriosis, unspecified: Secondary | ICD-10-CM | POA: Diagnosis not present

## 2023-03-09 DIAGNOSIS — E1122 Type 2 diabetes mellitus with diabetic chronic kidney disease: Secondary | ICD-10-CM | POA: Diagnosis not present

## 2023-03-09 DIAGNOSIS — D696 Thrombocytopenia, unspecified: Secondary | ICD-10-CM | POA: Diagnosis not present

## 2023-03-09 DIAGNOSIS — I712 Thoracic aortic aneurysm, without rupture, unspecified: Secondary | ICD-10-CM | POA: Diagnosis not present

## 2023-03-09 DIAGNOSIS — D631 Anemia in chronic kidney disease: Secondary | ICD-10-CM | POA: Diagnosis not present

## 2023-03-09 DIAGNOSIS — J449 Chronic obstructive pulmonary disease, unspecified: Secondary | ICD-10-CM | POA: Diagnosis not present

## 2023-03-09 DIAGNOSIS — N1831 Chronic kidney disease, stage 3a: Secondary | ICD-10-CM | POA: Diagnosis not present

## 2023-03-09 DIAGNOSIS — I129 Hypertensive chronic kidney disease with stage 1 through stage 4 chronic kidney disease, or unspecified chronic kidney disease: Secondary | ICD-10-CM | POA: Diagnosis not present

## 2023-03-09 DIAGNOSIS — N401 Enlarged prostate with lower urinary tract symptoms: Secondary | ICD-10-CM | POA: Diagnosis not present

## 2023-03-09 NOTE — Telephone Encounter (Signed)
Patient dropped off document Home Health Certificate (Order ID [id number]), to be filled out by provider. Patient requested to send it back via Fax within 5-days. Document is located in providers tray at front office.Please advise

## 2023-03-09 NOTE — Telephone Encounter (Signed)
We could order a CT scan however I am not sure if it would change much in terms of management at this point. If they have decided against surgery, then the results would probably not change anything. Also the contrast that he gets from the CT scan could be harmful to his kidneys and I would like to avoid this if possible. Recommend against repeating the scan at this point but we can have an appointment to discuss if they have further questions.  Katina Degree. Jimmey Ralph, MD 03/09/2023 10:51 AM

## 2023-03-10 DIAGNOSIS — D631 Anemia in chronic kidney disease: Secondary | ICD-10-CM

## 2023-03-10 DIAGNOSIS — E1122 Type 2 diabetes mellitus with diabetic chronic kidney disease: Secondary | ICD-10-CM

## 2023-03-10 DIAGNOSIS — N1831 Chronic kidney disease, stage 3a: Secondary | ICD-10-CM

## 2023-03-10 DIAGNOSIS — J309 Allergic rhinitis, unspecified: Secondary | ICD-10-CM

## 2023-03-10 DIAGNOSIS — I712 Thoracic aortic aneurysm, without rupture, unspecified: Secondary | ICD-10-CM

## 2023-03-10 DIAGNOSIS — K5900 Constipation, unspecified: Secondary | ICD-10-CM

## 2023-03-10 DIAGNOSIS — D696 Thrombocytopenia, unspecified: Secondary | ICD-10-CM

## 2023-03-10 DIAGNOSIS — E559 Vitamin D deficiency, unspecified: Secondary | ICD-10-CM

## 2023-03-10 DIAGNOSIS — J449 Chronic obstructive pulmonary disease, unspecified: Secondary | ICD-10-CM

## 2023-03-10 DIAGNOSIS — N401 Enlarged prostate with lower urinary tract symptoms: Secondary | ICD-10-CM

## 2023-03-10 DIAGNOSIS — M159 Polyosteoarthritis, unspecified: Secondary | ICD-10-CM

## 2023-03-10 DIAGNOSIS — E785 Hyperlipidemia, unspecified: Secondary | ICD-10-CM

## 2023-03-10 DIAGNOSIS — Z7982 Long term (current) use of aspirin: Secondary | ICD-10-CM

## 2023-03-10 DIAGNOSIS — E538 Deficiency of other specified B group vitamins: Secondary | ICD-10-CM

## 2023-03-10 DIAGNOSIS — Z87891 Personal history of nicotine dependence: Secondary | ICD-10-CM

## 2023-03-10 DIAGNOSIS — I129 Hypertensive chronic kidney disease with stage 1 through stage 4 chronic kidney disease, or unspecified chronic kidney disease: Secondary | ICD-10-CM

## 2023-03-10 DIAGNOSIS — Z8673 Personal history of transient ischemic attack (TIA), and cerebral infarction without residual deficits: Secondary | ICD-10-CM

## 2023-03-10 DIAGNOSIS — R351 Nocturia: Secondary | ICD-10-CM

## 2023-03-10 DIAGNOSIS — A329 Listeriosis, unspecified: Secondary | ICD-10-CM

## 2023-03-11 NOTE — Telephone Encounter (Signed)
Orders signed and faxed back to [facility] at [phone].

## 2023-03-14 ENCOUNTER — Telehealth: Payer: Self-pay | Admitting: Family Medicine

## 2023-03-14 ENCOUNTER — Telehealth: Payer: Medicare HMO | Admitting: Family Medicine

## 2023-03-14 NOTE — Telephone Encounter (Signed)
Drue Dun, told her Dr. Jimmey Ralph said,We wish him all the best. They should reach back out to [place] if we can be of any further assistance. Mardella Layman verbalized understanding and will let the daughter know.

## 2023-03-14 NOTE — Telephone Encounter (Signed)
Caller is Lillia Abed from [facility]. Caller states they picked up patient on Saturday and he/family decided to have their medical director take over his hospice care. For additional information, Lillia Abed can be reached @ [phone].

## 2023-03-14 NOTE — Telephone Encounter (Signed)
Noted. We wish him all the best. They should reach back out to [place] if we can be of any further assistance.  Katina Degree. Jimmey Ralph, MD 03/14/2023 1:08 PM

## 2023-03-14 NOTE — Telephone Encounter (Signed)
FYI, see message. 

## 2023-03-15 ENCOUNTER — Ambulatory Visit: Payer: Medicare HMO | Admitting: Infectious Diseases

## 2023-03-15 ENCOUNTER — Other Ambulatory Visit: Payer: Self-pay

## 2023-03-15 ENCOUNTER — Encounter: Payer: Self-pay | Admitting: Infectious Diseases

## 2023-03-15 VITALS — BP 128/80 | HR 98 | Resp 16 | Ht 65.0 in | Wt 149.6 lb

## 2023-03-15 DIAGNOSIS — Z452 Encounter for adjustment and management of vascular access device: Secondary | ICD-10-CM | POA: Diagnosis not present

## 2023-03-15 DIAGNOSIS — Z79899 Other long term (current) drug therapy: Secondary | ICD-10-CM | POA: Diagnosis not present

## 2023-03-15 DIAGNOSIS — I714 Abdominal aortic aneurysm, without rupture, unspecified: Secondary | ICD-10-CM

## 2023-03-15 DIAGNOSIS — A329 Listeriosis, unspecified: Secondary | ICD-10-CM | POA: Diagnosis not present

## 2023-03-15 NOTE — Progress Notes (Addendum)
Patient Active Problem List   Diagnosis Date Noted   Back pain 03/07/2023   Constipation 03/01/2023   Allergic rhinitis 01/20/2023   Leg edema 12/21/2022   Descending thoracic aortic aneurysm (HCC) 03/29/2022   AAA (abdominal aortic aneurysm) (HCC) 03/29/2022   Vitamin B12 deficiency 11/30/2021   Vitamin D deficiency 11/30/2021   Pre-diabetes 02/14/2020   Thrombocytopenia (HCC) 12/12/2018   Chronic obstructive pulmonary disease (HCC) 11/14/2014   Benign prostatic hyperplasia with nocturia 11/14/2014   Primary osteoarthritis involving multiple joints 11/14/2014   Former heavy tobacco smoker 12/04/2013   Normocytic anemia 03/02/2012   CVA (cerebral vascular accident) (HCC) 03/01/2012   HTN (hypertension) 03/01/2012   Hyperlipidemia 03/01/2012   Stage 3a chronic kidney disease (HCC) 03/01/2012    Patient's Medications  New Prescriptions   No medications on file  Previous Medications   ACETAMINOPHEN (TYLENOL) 500 MG TABLET    Take 1,000 mg by mouth every 6 (six) hours as needed for fever or mild pain.   ASPIRIN EC 81 MG TABLET    Take 1 tablet (81 mg total) by mouth daily.   AZELASTINE (ASTELIN) 0.1 % NASAL SPRAY    Place 2 sprays into both nostrils 2 (two) times daily.   BACLOFEN (LIORESAL) 20 MG TABLET    Take 1 tablet (20 mg total) by mouth 3 (three) times daily.   CALCIUM CARB-CHOLECALCIFEROL (CALCIUM 600+D) 600-20 MG-MCG TABS    Take 1 tablet by mouth daily.   HYDROCODONE-ACETAMINOPHEN (NORCO/VICODIN) 5-325 MG TABLET    Take 1 tablet by mouth every 6 (six) hours as needed for moderate pain.   LIDOCAINE (LIDODERM) 5 %    Place 1 patch onto the skin daily. Remove & Discard patch within 12 hours or as directed by MD   LINACLOTIDE (LINZESS) 290 MCG CAPS CAPSULE    Take 1 capsule (290 mcg total) by mouth daily before breakfast.   METHYLCOBALAMIN (B-12) 1000 MCG TBDP    Take 1,000 mg by mouth daily.   MONTELUKAST (SINGULAIR) 10 MG TABLET    Take 1 tablet (10 mg total) by mouth  at bedtime.   OMEGA-3 FATTY ACIDS (FISH OIL) 1000 MG CAPS    Take 1 capsule by mouth daily.   ROSUVASTATIN (CRESTOR) 40 MG TABLET    Take 1 tablet (40 mg total) by mouth daily.  Modified Medications   No medications on file  Discontinued Medications   No medications on file    Subjective: 87 Y O male with PMH as below including COPD, CVA, DM, HLD, HTN, CKD, BPH, OA, Chronic Thrombocytopenia, AAA under medical management due to high surgical risk who is here for HFU for Listeria monocytogenes bacteremia. Patient was recently admitted 6/2-/11 when he presented with fevers and chil, fatigue, non productive cough for several weeks. Initially treated for PNA but not much improvement and found to have listeria monocytogenes bacteremia. Seen by ID as well as Vascular with concerns for possible mycotic aneurysm. Patient was not considered a surgical candidate and planned for home hospice after completion of antibiotic course. Blood cultures cleared on 6/4. TTE 6/4 with no obvious vegetations, thickening of the AV, technically difficult study, TEE to be considered.   IMPRESSION: 1. 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 The appearance is suspicious for leaking left  common iliac artery aneurysm indicating contained or incipient rupture. An infected aneurysm is not entirely excluded. Further rupture could be life-threatening. Urgent vascular surgical consultation recommended. 2. Descending thoracic aortic aneurysm 6.5 cm in diameter. Cannot exclude small amount of focal chronic dissection along the upper margin of the thoracic aortic aneurysm. 3. 3.8 cm right lateral saccular aneurysm of the abdominal aorta just below the level of the renal arteries. 4. 2.1 cm  anterior saccular aneurysm just above the inferior mesenteric artery origin. 5. 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 6. Prominent stenosis at the origin of the celiac artery due to a combination of calcified plaque and median arcuate ligament syndrome. However, the celiac artery is not overtly occluded. There are some moderate collateral vessels between the SMA and celiac artery. 7. Moderate stenosis at the origin of the SMA. 8. Moderate stenosis at the origin of both renal arteries. 9. Nonspecific presacral edema. 10. Prominent main pulmonary artery at 4.1 cm diameter, query pulmonary arterial hypertension. 11. Coronary atherosclerosis. 12. Moderate cardiomegaly. 13. Passive atelectasis in the left lower lobe related to the thoracic aneurysm.   Patient was initially on double coverage with IV ampicillin and gentamicin until 6/6 then continued on monotherapy with ampicillin to complete 3 weeks course. EOT 6/25.   03/15/23 Accompanied by son. Reports IV antibiotic course has completed and PICC line has been removed. Per son Libby Maw, hospice forms signed last week and patient is back to hospice now. Continues to have back pain and was told it could be due to AA.  Denies fevers, chills. Denies nausea, vomiting and diarrhea. Feels better, appetite is also improving.   Review of Systems: all systems reviewed with pertinent positives and negatives as listed above   Past Medical History:  Diagnosis Date   BPH (benign prostatic hyperplasia)    COPD (chronic obstructive pulmonary disease) (HCC)    CVA (cerebral vascular accident) (HCC)    Diabetes mellitus without complication (HCC)    Dizziness and giddiness 07/09/2015   Hyperlipidemia    Hypertension    Renal disorder    Past Surgical History:  Procedure Laterality Date   APPENDECTOMY     TEE WITHOUT CARDIOVERSION  03/10/2012   Procedure: TRANSESOPHAGEAL ECHOCARDIOGRAM (TEE);  Surgeon: Ricki Rodriguez, MD;  Location: [facility] ENDOSCOPY;   Service: Cardiovascular;  Laterality: N/A;     Social History   Tobacco Use   Smoking status: Former   Smokeless tobacco: Never  Substance Use Topics   Alcohol use: Yes    Alcohol/week: 0.0 standard drinks of alcohol    Comment: occasionally   Drug use: No    Family History  Problem Relation Age of Onset   Heart attack Father    Cancer Mother    Cancer Sister        brain tumor   Heart attack Brother    Heart attack Brother    Heart disease Brother     Allergies  Allergen Reactions   Ace Inhibitors     cough    Health Maintenance  Topic Date Due   COVID-19 Vaccine (6 - 2023-24 season) 03/23/2023 (Originally 05/14/2022)   Zoster Vaccines- Shingrix (1 of 2) 04/22/2023 (Originally 06/19/1955)   INFLUENZA VACCINE  04/14/2023   Medicare Annual Wellness (AWV)  05/28/2023   DTaP/Tdap/Td (2 - Td or Tdap) 01/22/2024   Pneumonia Vaccine 62+ Years old  Completed   HPV VACCINES  Aged Out    Objective:  Vitals:   03/15/23 1054  Resp: 16  Weight: 149 lb 9.6 oz (67.9 kg)  Height: 5\' 5"  (1.651 m)   Body mass index is 24.89 kg/m.  Physical Exam Constitutional:      Appearance: Normal appearance.  HENT:     Head: Normocephalic and atraumatic.      Mouth: Mucous membranes are moist.  Eyes:    Conjunctiva/sclera: Conjunctivae normal.     Pupils: Pupils are equal, round, and bilaterally symmetrical   Cardiovascular:     Rate and Rhythm: Normal rate and regular rhythm.     Heart sounds: s1s2  Pulmonary:     Effort: Pulmonary effort is normal.     Breath sounds: Normal breath sounds.   Abdominal:     General: Non distended     Palpations: soft.   Musculoskeletal:        General: Normal range of motion.   Skin:    General: Skin is warm and dry.     Comments:  Neurological:     General: grossly non focal     Mental Status: awake, alert and oriented to person, place, and time. ambulatory  Psychiatric:        Mood and Affect: Mood normal.   Lab  Results Lab Results  Component Value Date   WBC 9.2 02/22/2023   HGB 9.4 (L) 02/22/2023   HCT 29.6 (L) 02/22/2023   MCV 92.2 02/22/2023   PLT 143 (L) 02/22/2023    Lab Results  Component Value Date   CREATININE 1.56 (H) 02/22/2023   BUN 14 02/22/2023   NA 134 (L) 02/22/2023   K 3.8 02/22/2023   CL 99 02/22/2023   CO2 25 02/22/2023    Lab Results  Component Value Date   ALT 13 02/16/2023   AST 18 02/16/2023   ALKPHOS 49 02/16/2023   BILITOT 0.5 02/16/2023    Lab Results  Component Value Date   CHOL 128 10/07/2022   HDL 32.70 (L) 10/07/2022   LDLCALC 59 10/07/2022   LDLDIRECT 55.0 06/02/2021   TRIG 183.0 (H) 10/07/2022   CHOLHDL 4 10/07/2022   No results found for: "LABRPR", "RPRTITER" No results found for: "HIV1RNAQUANT", "HIV1RNAVL", "CD4TABS"  Microbiology Results for orders placed or performed during the hospital encounter of 02/13/23  Resp panel by RT-PCR (RSV, Flu A&B, Covid) Anterior Nasal Swab     Status: None   Collection Time: 02/13/23 12:26 PM   Specimen: Anterior Nasal Swab  Result Value Ref Range Status   SARS Coronavirus 2 by RT PCR NEGATIVE NEGATIVE Final    Comment: (NOTE) SARS-CoV-2 target nucleic acids are NOT DETECTED.  The SARS-CoV-2 RNA is generally detectable in upper respiratory specimens during the acute phase of infection. The lowest concentration of SARS-CoV-2 viral copies this assay can detect is 138 copies/mL. A negative result does not preclude SARS-Cov-2 infection and should not be used as the sole basis for treatment or other patient management decisions. A negative result may occur with  improper specimen collection/handling, submission of specimen other than nasopharyngeal swab, presence of viral mutation(s) within the areas targeted by this assay, and inadequate number of viral copies(<138 copies/mL). A negative result must be combined with clinical observations, patient history, and epidemiological information. The expected  result is Negative.  Fact Sheet for Patients:  BloggerCourse.com  Fact Sheet for Healthcare Providers:  SeriousBroker.it  This test is no t yet approved or cleared by the [place] FDA and  has been authorized for detection and/or diagnosis of SARS-CoV-2 by FDA under an Emergency Use Authorization (  EUA). This EUA will remain  in effect (meaning this test can be used) for the duration of the COVID-19 declaration under Section 564(b)(1) of the Act, 21 U.S.C.section 360bbb-3(b)(1), unless the authorization is terminated  or revoked sooner.       Influenza A by PCR NEGATIVE NEGATIVE Final   Influenza B by PCR NEGATIVE NEGATIVE Final    Comment: (NOTE) The Xpert Xpress SARS-CoV-2/FLU/RSV plus assay is intended as an aid in the diagnosis of influenza from Nasopharyngeal swab specimens and should not be used as a sole basis for treatment. Nasal washings and aspirates are unacceptable for Xpert Xpress SARS-CoV-2/FLU/RSV testing.  Fact Sheet for Patients: BloggerCourse.com  Fact Sheet for Healthcare Providers: SeriousBroker.it  This test is not yet approved or cleared by the [place] FDA and has been authorized for detection and/or diagnosis of SARS-CoV-2 by FDA under an Emergency Use Authorization (EUA). This EUA will remain in effect (meaning this test can be used) for the duration of the COVID-19 declaration under Section 564(b)(1) of the Act, 21 U.S.C. section 360bbb-3(b)(1), unless the authorization is terminated or revoked.     Resp Syncytial Virus by PCR NEGATIVE NEGATIVE Final    Comment: (NOTE) Fact Sheet for Patients: BloggerCourse.com  Fact Sheet for Healthcare Providers: SeriousBroker.it  This test is not yet approved or cleared by the [place] FDA and has been authorized for detection and/or diagnosis of  SARS-CoV-2 by FDA under an Emergency Use Authorization (EUA). This EUA will remain in effect (meaning this test can be used) for the duration of the COVID-19 declaration under Section 564(b)(1) of the Act, 21 U.S.C. section 360bbb-3(b)(1), unless the authorization is terminated or revoked.  Performed at [facility], [address], [place], [place] [address]   Blood Culture (routine x 2)     Status: Abnormal   Collection Time: 02/13/23 12:49 PM   Specimen: BLOOD  Result Value Ref Range Status   Specimen Description   Final    BLOOD LEFT ANTECUBITAL Performed at [facility], [address], [place], [place] [address]    Special Requests   Final    BOTTLES DRAWN AEROBIC AND ANAEROBIC Blood Culture adequate volume Performed at [facility], [address], [place], [place] [address]    Culture  Setup Time   Final    GRAM POSITIVE RODS IN BOTH AEROBIC AND ANAEROBIC BOTTLES CRITICAL RESULT CALLED TO, READ BACK BY AND VERIFIED WITH: PHARMD NICK G [id number] @1130  BY SM    Culture (A)  Final    LISTERIA MONOCYTOGENES Standardized susceptibility testing for this organism is not available. HEALTH DEPARTMENT NOTIFIED Performed at [facility], [address], [place], [place] [address]    Report Status 02/16/2023 FINAL  Final  Blood Culture (routine x 2)     Status: Abnormal   Collection Time: 02/13/23 12:55 PM   Specimen: BLOOD  Result Value Ref Range Status   Specimen Description   Final    BLOOD SITE NOT SPECIFIED Performed at [facility], [address] [place], [place] [address]    Special Requests   Final    BOTTLES DRAWN AEROBIC AND ANAEROBIC Blood Culture results may not be optimal due to an excessive volume of blood received in culture bottles Performed at [facility], [address], [place], [place] [address]    Culture  Setup Time   Final    GRAM POSITIVE RODS IN  BOTH AEROBIC AND ANAEROBIC  BOTTLES CRITICAL VALUE NOTED.  VALUE IS CONSISTENT WITH PREVIOUSLY REPORTED AND CALLED VALUE.    Culture (A)  Final    LISTERIA MONOCYTOGENES Standardized susceptibility testing for this organism is not available. HEALTH DEPARTMENT NOTIFIED Performed at [facility], [address] [place], [place] [address]    Report Status 02/16/2023 FINAL  Final  Culture, blood (Routine X 2) w Reflex to ID Panel     Status: None   Collection Time: 02/15/23 11:06 AM   Specimen: Left Antecubital; Blood  Result Value Ref Range Status   Specimen Description   Final    LEFT ANTECUBITAL BLOOD Performed at [facility], [address] [place], [place] [address]    Special Requests   Final    AEROBIC BOTTLE ONLY Blood Culture adequate volume Performed at [facility], [address] [place], [place] [address]    Culture   Final    NO GROWTH 5 DAYS Performed at [facility], [address] [place], [place] [address]    Report Status 02/20/2023 FINAL  Final  Culture, blood (Routine X 2) w Reflex to ID Panel     Status: None   Collection Time: 02/15/23 11:06 AM   Specimen: BLOOD LEFT HAND  Result Value Ref Range Status   Specimen Description   Final    BLOOD LEFT HAND Performed at [facility], [address], [place], [place] [address]    Special Requests   Final    AEROBIC BOTTLE ONLY Blood Culture adequate volume Performed at [facility], [address], [place], [place] [address]    Culture   Final    NO GROWTH 5 DAYS Performed at [facility], [address], [place], [place] [address]    Report Status 02/20/2023 FINAL  Final   Imaging CT Angio Abd/Pel w/ and/or w/o  Result Date: 02/19/2023 CLINICAL DATA:  Possible aneurysm leak of the left iliac artery aneurysm. Thoracic and abdominal aortic aneurysms. EXAM: CTA ABDOMEN AND PELVIS WITHOUT AND WITH CONTRAST TECHNIQUE:  Multidetector CT imaging of the abdomen and pelvis was performed using the standard protocol during bolus administration of intravenous contrast. Multiplanar reconstructed images and MIPs were obtained and reviewed to evaluate the vascular anatomy. RADIATION DOSE REDUCTION: This exam was performed according to the departmental dose-optimization program which includes automated exposure control, adjustment of the mA and/or kV according to patient size and/or use of iterative reconstruction technique. CONTRAST:  80mL OMNIPAQUE IOHEXOL 350 MG/ML SOLN COMPARISON:  02/19/2023 FINDINGS: VASCULAR Aorta: Descending thoracic aortic aneurysm 6.5 cm in diameter on image 11 series 9. At the hiatus the aorta measures 6.2 cm in diameter. Mural thrombus noted. Just below the level of the renal arteries, there is a 3.8 cm saccular aneurysm extending to the right of the abdominal aorta on image 68 series 9. Just above the inferior mesenteric artery origin there is an anterior 2.1 cm saccular aneurysm on image 75 series 9. On image 8 of series 9, there is a suspected small dissection flap in the descending thoracic aorta which is likely chronic with associated calcification on image 8 series 9. Similar appearance on CT thoracic spine of 12/29/2021. Celiac: Prominent stenosis at the origin of the celiac artery due to a combination of calcified plaque and median arcuate ligament syndrome. However, the celiac artery is not overtly occluded. There are some moderate collateral vessels between the SMA and celiac artery. SMA: Moderate stenosis due to  calcified plaque at the origin of the SMA. No distal SMA filling defect is identified. Renals: Single right renal artery demonstrating at least moderate proximal stenosis due to hard and soft plaque. Similarly there is at least moderate and potentially prominent stenosis of the single left proximal renal artery due to hard and soft plaque. IMA: Patent Inflow: 6.3 cm aneurysm of the left common  iliac artery with some internal irregular mural thrombus, abnormal stranding/edema in the soft tissues surrounding the aneurysm, and potentially some intramural hematoma inferiorly where there is faint increase in density along the aneurysm wall for example on image 67 series 5, although strictly speaking this is somewhat difficult to differentiate from calcification in the wall and/or mural thrombus. From the inferior extent of this aneurysm the external and internal iliac arteries are visible and appear patent although there is high-grade stenosis at the origin of the internal iliac artery. Substantial atheromatous calcification of both iliac arteries. The right common iliac artery demonstrates aneurysmal dilatation but only up to 3.3 cm and without surrounding inflammatory stranding. Extraluminal leak of contrast is not directly visualized on the arterial phase images and delayed phase images do not include the pelvis. Proximal Outflow: External iliac and common femoral artery atherosclerotic vascular calcification without substantial stenosis. Veins: Unremarkable Review of the MIP images confirms the above findings. NON-VASCULAR Lower chest: Prominent main pulmonary artery at 4.1 cm diameter. Coronary atherosclerosis involving the left main, left anterior descending, circumflex, and right coronary arteries. Moderate cardiomegaly. Passive atelectasis in the left lower lobe related to the thoracic aneurysm. Hepatobiliary: Unremarkable Pancreas: Unremarkable Spleen: Unremarkable Adrenals/Urinary Tract: Simple right renal cysts. Left renal hypodense lesions are probably cysts but technically too small to characterize. No further imaging workup of these lesions is indicated. Adrenal glands unremarkable.  Urinary bladder unremarkable. Stomach/Bowel: Unremarkable Lymphatic: No pathologic adenopathy. Reproductive: Unremarkable Other: Nonspecific presacral edema. Musculoskeletal: Bridging spurring of both sacroiliac  joints. Grade 1 degenerative anterolisthesis at L5-S1. IMPRESSION: 1. 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 The appearance is suspicious for leaking left common iliac artery aneurysm indicating contained or incipient rupture. An infected aneurysm is not entirely excluded. Further rupture could be life-threatening. Urgent vascular surgical consultation recommended. 2. Descending thoracic aortic aneurysm 6.5 cm in diameter. Cannot exclude small amount of focal chronic dissection along the upper margin of the thoracic aortic aneurysm. 3. 3.8 cm right lateral saccular aneurysm of the abdominal aorta just below the level of the renal arteries. 4. 2.1 cm anterior saccular aneurysm just above the inferior mesenteric artery origin. 5. 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 6. Prominent stenosis at the origin of the celiac artery due to a combination of calcified plaque and median arcuate ligament syndrome. However, the celiac artery is not overtly occluded. There are some moderate collateral vessels between the SMA and celiac artery. 7. Moderate stenosis at the origin of the SMA. 8. Moderate stenosis at the origin of both renal arteries. 9. Nonspecific presacral edema. 10. Prominent main pulmonary artery at 4.1 cm diameter, query pulmonary arterial hypertension. 11. Coronary atherosclerosis. 12. Moderate cardiomegaly. 13. Passive atelectasis in the left lower lobe related to the thoracic aneurysm. Aortic Atherosclerosis (ICD10-I70.0). Electronically Signed   By: Gaylyn Rong M.D.   On: 02/19/2023 14:09   CT ABDOMEN PELVIS WO CONTRAST  Addendum Date:  02/19/2023   ADDENDUM REPORT: 02/19/2023 12:18 ADDENDUM: Critical Value/emergent results were called by telephone at the  time of interpretation on 02/19/2023 at 12:18 pm to provider RIPUDEEP RAI , who verbally acknowledged these results. Electronically Signed   By: Kennith Center M.D.   On: 02/19/2023 12:18   Result Date: 02/19/2023 CLINICAL DATA:  Abdominal pain. EXAM: CT ABDOMEN AND PELVIS WITHOUT CONTRAST TECHNIQUE: Multidetector CT imaging of the abdomen and pelvis was performed following the standard protocol without IV contrast. RADIATION DOSE REDUCTION: This exam was performed according to the departmental dose-optimization program which includes automated exposure control, adjustment of the mA and/or kV according to patient size and/or use of iterative reconstruction technique. COMPARISON:  Chest CT 02/17/2023.  06/06/2007 FINDINGS: Lower chest: Atelectasis noted in the dependent lung bases with descending thoracic aortic aneurysm, better characterized on recent chest CT. Hepatobiliary: No suspicious focal abnormality in the liver on this study without intravenous contrast. There is no evidence for gallstones, gallbladder wall thickening, or pericholecystic fluid. No intrahepatic or extrahepatic biliary dilation. Pancreas: No focal mass lesion. No dilatation of the main duct. No intraparenchymal cyst. No peripancreatic edema. Spleen: No splenomegaly. No focal mass lesion. Adrenals/Urinary Tract: No adrenal nodule or mass. 7.3 cm water density lesion posterior right kidney compatible with simple cyst. Irregular low-density lesion anterior upper pole right kidney at the site of previously demonstrated 3.8 cm simple cyst probably represents involution. No suspicious abnormality in the left kidney. No evidence for hydroureter. The urinary bladder appears normal for the degree of distention. Stomach/Bowel: Stomach is unremarkable. No gastric wall thickening. No evidence of outlet obstruction. Duodenum is normally positioned as is the ligament of Treitz. No small bowel wall thickening. No small bowel dilatation. The appendix is not  well visualized, but there is no edema or inflammation in the region of the cecum. No gross colonic mass. No colonic wall thickening. Vascular/Lymphatic: Juxtarenal abdominal aortic aneurysm measures 5.9 x 4.3 cm in maximum orthogonal diameter. Aneurysmal dilatation of the left common iliac artery noted measuring up to 5.8 x 5.7 cm in maximum orthogonal diameter. Aneurysm extends to the level of the iliac bifurcation there is perivascular edema/fluid around the common iliac artery aneurysm without a discrete hematoma. Right common iliac artery measures up to 3.1 cm diameter Reproductive: The prostate gland and seminal vesicles are unremarkable. Other: No intraperitoneal free fluid. Musculoskeletal: No worrisome lytic or sclerotic osseous abnormality. IMPRESSION: 1. 5.8 x 5.7 cm left common iliac artery aneurysm with perivascular edema/fluid around the aneurysm extending into the left extraperitoneal pelvic floor. No discrete hematoma. Findings could reflect aneurysm leak. Infected common iliac artery aneurysm could also have this appearance. 2. Descending thoracic aortic aneurysm better characterized on recent chest CTA. 3. 5.9 x 4.3 cm juxtarenal abdominal aortic aneurysm. 4. 3.1 cm right common iliac artery aneurysm. 5. Irregular low-density lesion anterior upper pole right kidney at the site of previously demonstrated 3.8 cm simple cyst probably represents involution of that lesion. MRI of the abdomen with and without contrast recommended to further evaluate. Electronically Signed: By: Kennith Center M.D. On: 02/19/2023 12:08   DG Abd 1 View  Result Date: 02/19/2023 CLINICAL DATA:  Pain. EXAM: ABDOMEN - 1 VIEW COMPARISON:  None Available. FINDINGS: Mild diffuse gaseous distention of small bowel and colon is identified in the abdomen. No overt features of bowel obstruction. Degenerative changes noted lumbar spine. SI joints and symphysis pubis unremarkable. IMPRESSION: Mild diffuse gaseous distention of small  bowel and colon without overt features of  bowel obstruction. Electronically Signed   By: Kennith Center M.D.   On: 02/19/2023 10:19   VAS [place] LOWER EXTREMITY VENOUS (DVT)  Result Date: 02/19/2023  Lower Venous DVT Study Patient Name:  BENYAMIN NAKAMURA  Date of Exam:   02/18/2023 Medical Rec #: [id number]           Accession #:    [id number] Date of Birth: 02-12-1936           Patient Gender: M Patient Age:   62 years Exam Location:  [facility] Procedure:      VAS [place] LOWER EXTREMITY VENOUS (DVT) Referring Phys: RIPUDEEP RAI --------------------------------------------------------------------------------  Indications: Edema, positive D-Dimer.  Comparison Study: No previous study, Performing Technologist: McKayla Maag RVT, [place]  Examination Guidelines: A complete evaluation includes B-mode imaging, spectral Doppler, color Doppler, and power Doppler as needed of all accessible portions of each vessel. Bilateral testing is considered an integral part of a complete examination. Limited examinations for reoccurring indications may be performed as noted. The reflux portion of the exam is performed with the patient in reverse Trendelenburg.  +---------+---------------+---------+-----------+----------+--------------+ RIGHT    CompressibilityPhasicitySpontaneityPropertiesThrombus Aging +---------+---------------+---------+-----------+----------+--------------+ CFV      Full           Yes      Yes                                 +---------+---------------+---------+-----------+----------+--------------+ SFJ      Full                                                        +---------+---------------+---------+-----------+----------+--------------+ FV Prox  Full                                                        +---------+---------------+---------+-----------+----------+--------------+ FV Mid   Full                                                         +---------+---------------+---------+-----------+----------+--------------+ FV DistalFull                                                        +---------+---------------+---------+-----------+----------+--------------+ PFV      Full                                                        +---------+---------------+---------+-----------+----------+--------------+ POP      Full           Yes      Yes                                 +---------+---------------+---------+-----------+----------+--------------+  PTV      Full                                                        +---------+---------------+---------+-----------+----------+--------------+ PERO     Full                                                        +---------+---------------+---------+-----------+----------+--------------+   +---------+---------------+---------+-----------+----------+--------------+ LEFT     CompressibilityPhasicitySpontaneityPropertiesThrombus Aging +---------+---------------+---------+-----------+----------+--------------+ CFV      Full           Yes      Yes                                 +---------+---------------+---------+-----------+----------+--------------+ SFJ      Full                                                        +---------+---------------+---------+-----------+----------+--------------+ FV Prox  Full                                                        +---------+---------------+---------+-----------+----------+--------------+ FV Mid   Full                                                        +---------+---------------+---------+-----------+----------+--------------+ FV DistalFull                                                        +---------+---------------+---------+-----------+----------+--------------+ PFV      Full                                                         +---------+---------------+---------+-----------+----------+--------------+ POP      Full           Yes      Yes                                 +---------+---------------+---------+-----------+----------+--------------+ PTV      Full                                                        +---------+---------------+---------+-----------+----------+--------------+  PERO     Full                                                        +---------+---------------+---------+-----------+----------+--------------+     Summary: BILATERAL: - No evidence of deep vein thrombosis seen in the lower extremities, bilaterally. - No evidence of superficial venous thrombosis in the lower extremities, bilaterally. -No evidence of popliteal cyst, bilaterally.   *See table(s) above for measurements and observations. Electronically signed by Gerarda Fraction on 02/19/2023 at 9:34:44 AM.    Final    CT Angio Chest Pulmonary Embolism (PE) W or WO Contrast  Result Date: 02/17/2023 CLINICAL DATA:  PE suspected, positive D-dimer EXAM: CT ANGIOGRAPHY CHEST WITH CONTRAST TECHNIQUE: Multidetector CT imaging of the chest was performed using the standard protocol during bolus administration of intravenous contrast. Multiplanar CT image reconstructions and MIPs were obtained to evaluate the vascular anatomy. RADIATION DOSE REDUCTION: This exam was performed according to the departmental dose-optimization program which includes automated exposure control, adjustment of the mA and/or kV according to patient size and/or use of iterative reconstruction technique. CONTRAST:  75mL OMNIPAQUE IOHEXOL 350 MG/ML SOLN COMPARISON:  12/29/2021 FINDINGS: Cardiovascular: Examination for pulmonary embolism is somewhat limited by breath motion artifact. Within this limitation, no evidence of pulmonary embolism through the segmental pulmonary arterial level. Cardiomegaly. Gross enlargement of the main pulmonary artery measuring 4.8 cm in caliber.  Three-vessel coronary artery calcifications. No pericardial effusion. Right upper extremity PICC. Aortic atherosclerosis. Large, fusiform aneurysm of the descending thoracic aorta, measuring 6.8 x 6.4 cm, previously 6.3 x 6.0 cm (series 5, image 192, series 7, image 71). Mediastinum/Nodes: No enlarged mediastinal, hilar, or axillary lymph nodes. Thyroid gland, trachea, and esophagus demonstrate no significant findings. Lungs/Pleura: Moderate centrilobular emphysema. No pleural effusion or pneumothorax. Upper Abdomen: No acute abnormality. Musculoskeletal: No chest wall abnormality. No acute osseous findings. Review of the MIP images confirms the above findings. IMPRESSION: 1. Examination for pulmonary embolism is somewhat limited by breath motion artifact. Within this limitation, no evidence of pulmonary embolism through the segmental pulmonary arterial level. 2. Cardiomegaly and coronary artery disease. 3. Gross enlargement of the main pulmonary artery, as can be seen in pulmonary hypertension. 4. Large, fusiform aneurysm of the descending thoracic aorta, measuring 6.8 x 6.4 cm, previously 6.3 x 6.0 cm. Greater than 5 mm growth over the past 12 months is associated with an increased risk of aneurysm rupture. Recommend cardiothoracic/vascular surgery referral if not already obtained. This recommendation follows 2010 ACCF/AHA/AATS/ACR/ASA/SCA/SCAI/SIR/STS/SVM Guidelines for the Diagnosis and Management of Patients With Thoracic Aortic Disease. Circulation. 2010; 121: Z610-R604. Aortic aneurysm NOS (ICD10-I71.9) 5. Emphysema. Aortic Atherosclerosis (ICD10-I70.0) and Emphysema (ICD10-J43.9). Electronically Signed   By: Jearld Lesch M.D.   On: 02/17/2023 17:50   DG CHEST PORT 1 VIEW  Result Date: 02/17/2023 CLINICAL DATA:  Dyspnea. EXAM: PORTABLE CHEST 1 VIEW COMPARISON:  February 13, 2023. FINDINGS: Stable cardiomegaly. Enlarged descending thoracic aortic aneurysm is again noted. Minimal bibasilar subsegmental  atelectasis is noted. Right-sided PICC line is noted with tip in expected position of the SVC. Bony thorax is unremarkable. IMPRESSION: Minimal bibasilar subsegmental atelectasis. Stable appearance of descending thoracic aortic aneurysm. Electronically Signed   By: Lupita Raider M.D.   On: 02/17/2023 16:14   [place] EKG SITE RITE  Result Date: 02/16/2023 If [facility]  image not attached, placement could not be confirmed due to current cardiac rhythm.  US AORTA  Result Date: 02/15/2023 CLINICAL DATA:  Abdominal pain EXAM: ULTRASOUND OF ABDOMINAL AORTA TECHNIQUE: Ultrasound examination of the abdominal aorta and proximal common iliac arteries was performed to evaluate for aneurysm. Additional color and Doppler images of the distal aorta were obtained to document patency. COMPARISON:  MRI from the previous day. FINDINGS: Abdominal aortic measurements as follows: Proximal:  5.9 cm Mid:  5.8 cm Distal:  2.9 cm Patent: Yes, peak systolic velocity is 57.7 cm/s Right common iliac artery: 3.8 cm Left common iliac artery: 6.0 cm IMPRESSION: Abdominal aortic aneurysm measuring up to 5.9 cm. 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 Recommend referral to a vascular specialist. Additionally CTA of the chest, abdomen and pelvis would be helpful for more dedicated evaluation. This recommendation follows ACR consensus guidelines: White Paper of the ACR Incidental Findings Committee II on Vascular Findings. J Am Coll Radiol 2013; 10:789-794. These results will be called to the ordering clinician or representative by the Radiologist Assistant, and communication documented in the PACS or [employer]. Electronically Signed   By: Alcide Clever M.D.   On: 02/15/2023 20:41   ECHOCARDIOGRAM COMPLETE  Result Date: 02/15/2023    ECHOCARDIOGRAM REPORT   Patient Name:   DEFORREST HICKINGBOTTOM Date of Exam: 02/15/2023 Medical Rec #:  [id number]          Height:       65.0 in  Accession #:    [id number]         Weight:       153.0 lb Date of Birth:  26-May-1936          BSA:          1.765 m Patient Age:    86 years           BP:           101/85 mmHg Patient Gender: M                  HR:           91 bpm. Exam Location:  Inpatient Procedure: 2D Echo, Cardiac Doppler and Color Doppler Indications:    Bacteremia  History:        Patient has prior history of Echocardiogram examinations, most                 recent 03/10/2015. COPD and Stroke; Risk Factors:Hypertension,                 Dyslipidemia and Diabetes.  Sonographer:    Milda Smart Referring Phys: [id number] [address] Nine  Sonographer Comments: Image acquisition challenging due to patient body habitus. IMPRESSIONS  1. Left ventricular ejection fraction, by estimation, is 55 to 60%. The left ventricle has normal function. The left ventricle has no regional wall motion abnormalities. There is moderate concentric left ventricular hypertrophy. Left ventricular diastolic parameters were normal.  2. Right ventricular systolic function is normal. The right ventricular size is normal.  3. The mitral valve is grossly normal. No evidence of mitral valve regurgitation. No evidence of mitral stenosis.  4. The aortic valve is grossly normal. There is mild calcification of the aortic valve. There is mild thickening of the aortic valve. Aortic valve regurgitation is mild. No aortic stenosis is present.  5. There is borderline dilatation of the ascending aorta, measuring 41 mm.  6. The inferior vena cava is normal in size with greater than 50% respiratory variability, suggesting right atrial pressure of 3 mmHg.  7. Technically difficult study; there is mild AI with thickening of the aortic valve. Consider TEE for further evaluation if clinically indicated. FINDINGS  Left Ventricle: Left ventricular ejection fraction, by estimation, is 55 to 60%. The left ventricle has normal function. The left ventricle has no regional wall motion abnormalities. The  left ventricular internal cavity size was normal in size. There is  moderate concentric left ventricular hypertrophy. Left ventricular diastolic parameters were normal. Right Ventricle: The right ventricular size is normal. No increase in right ventricular wall thickness. Right ventricular systolic function is normal. Left Atrium: Left atrial size was normal in size. Right Atrium: Right atrial size was normal in size. Pericardium: There is no evidence of pericardial effusion. Mitral Valve: The mitral valve is grossly normal. No evidence of mitral valve regurgitation. No evidence of mitral valve stenosis. Tricuspid Valve: The tricuspid valve is normal in structure. Tricuspid valve regurgitation is not demonstrated. No evidence of tricuspid stenosis. Aortic Valve: The aortic valve is grossly normal. There is mild calcification of the aortic valve. There is mild thickening of the aortic valve. Aortic valve regurgitation is mild. Aortic regurgitation PHT measures 307 msec. No aortic stenosis is present. Pulmonic Valve: The pulmonic valve was grossly normal. Pulmonic valve regurgitation is not visualized. No evidence of pulmonic stenosis. Aorta: The aortic root is normal in size and structure. There is borderline dilatation of the ascending aorta, measuring 41 mm. Venous: The inferior vena cava is normal in size with greater than 50% respiratory variability, suggesting right atrial pressure of 3 mmHg. IAS/Shunts: No atrial level shunt detected by color flow Doppler.  LEFT VENTRICLE PLAX 2D LVIDd:         4.90 cm     Diastology LVIDs:         3.90 cm     LV e' medial:    6.20 cm/s LV PW:         1.40 cm     LV E/e' medial:  10.0 LV IVS:        1.40 cm     LV e' lateral:   8.27 cm/s LVOT diam:     2.40 cm     LV E/e' lateral: 7.5 LV SV:         97 LV SV Index:   55 LVOT Area:     4.52 cm  LV Volumes (MOD) LV vol d, MOD A2C: 58.9 ml LV vol d, MOD A4C: 64.3 ml LV vol s, MOD A2C: 28.9 ml LV vol s, MOD A4C: 31.4 ml LV SV MOD  A2C:     30.0 ml LV SV MOD A4C:     64.3 ml LV SV MOD BP:      32.2 ml RIGHT VENTRICLE             IVC RV S prime:     16.80 cm/s  IVC diam: 2.00 cm TAPSE (M-mode): 1.8 cm LEFT ATRIUM             Index        RIGHT ATRIUM           Index LA diam:        4.60 cm 2.61 cm/m   RA Area:     16.30 cm LA Vol (A2C):   32.7 ml 18.52 ml/m  RA Volume:   34.40 ml  19.49 ml/m  LA Vol (A4C):   41.6 ml 23.57 ml/m LA Biplane Vol: 37.2 ml 21.07 ml/m  AORTIC VALVE LVOT Vmax:   139.00 cm/s LVOT Vmean:  101.000 cm/s LVOT VTI:    0.214 m AI PHT:      307 msec  AORTA Ao Root diam: 4.00 cm Ao Asc diam:  4.10 cm MITRAL VALVE MV Area (PHT): 3.10 cm    SHUNTS MV Decel Time: 245 msec    Systemic VTI:  0.21 m MV E velocity: 62.10 cm/s  Systemic Diam: 2.40 cm MV A velocity: 77.60 cm/s MV E/A ratio:  0.80 Aditya Sabharwal Electronically signed by Dorthula Nettles Signature Date/Time: 02/15/2023/11:56:02 AM    Final    MR Lumbar Spine W Wo Contrast  Result Date: 02/15/2023 CLINICAL DATA:  Back pain, infection suspected EXAM: MRI THORACIC AND LUMBAR SPINE WITHOUT AND WITH CONTRAST TECHNIQUE: Multiplanar and multiecho pulse sequences of the thoracic and lumbar spine were obtained without and with intravenous contrast. CONTRAST:  7mL GADAVIST GADOBUTROL 1 MMOL/ML IV SOLN COMPARISON:  CT thoracic spine 12/29/2021 FINDINGS: MRI THORACIC SPINE FINDINGS Alignment:  Grade 1 anterolisthesis at T9-10 and T10-11. Vertebrae: Chronic wedge compression fracture of T7. T9 hemangioma. No acute fracture or discitis-osteomyelitis. Cord:  Normal Paraspinal and other soft tissues: Known descending thoracic aortic aneurysm. Diameter of 6.2 cm is unchanged. Disc levels: No spinal canal stenosis. MRI LUMBAR SPINE FINDINGS Segmentation:  Standard. Alignment:  Grade 1 anterolisthesis at L5-S1 Vertebrae:  No fracture, evidence of discitis, or bone lesion. Conus medullaris: Extends to the L1 level and appears normal. Paraspinal and other soft tissues: Infrarenal  abdominal aortic aneurysm measures 5.0 cm, unchanged Disc levels: L1-L2: Normal disc space and facet joints. No spinal canal stenosis. No neural foraminal stenosis. L2-L3: Normal disc space and facet joints. No spinal canal stenosis. No neural foraminal stenosis. L3-L4: Left asymmetric disc bulge. Mild facet hypertrophy. No spinal canal stenosis. No neural foraminal stenosis. L4-L5: Intermediate sized left asymmetric disc bulge with moderate facet arthrosis. Mild spinal canal stenosis. Moderate left neural foraminal stenosis. L5-S1: Severe facet arthrosis with partial disc uncovering. Mild spinal canal stenosis. No neural foraminal stenosis. Visualized sacrum: Normal. IMPRESSION: 1. No acute abnormality of the thoracic or lumbar spine. 2. Mild spinal canal stenosis and moderate left neural foraminal stenosis at L4-L5. 3. Severe facet arthrosis at L5-S1 with grade 1 anterolisthesis and mild spinal canal stenosis. 4. Unchanged thoracic and abdominal aortic aneurysms compared to 12/29/2021. Electronically Signed   By: Deatra Robinson M.D.   On: 02/15/2023 00:50   MR THORACIC SPINE W WO CONTRAST  Result Date: 02/15/2023 CLINICAL DATA:  Back pain, infection suspected EXAM: MRI THORACIC AND LUMBAR SPINE WITHOUT AND WITH CONTRAST TECHNIQUE: Multiplanar and multiecho pulse sequences of the thoracic and lumbar spine were obtained without and with intravenous contrast. CONTRAST:  7mL GADAVIST GADOBUTROL 1 MMOL/ML IV SOLN COMPARISON:  CT thoracic spine 12/29/2021 FINDINGS: MRI THORACIC SPINE FINDINGS Alignment:  Grade 1 anterolisthesis at T9-10 and T10-11. Vertebrae: Chronic wedge compression fracture of T7. T9 hemangioma. No acute fracture or discitis-osteomyelitis. Cord:  Normal Paraspinal and other soft tissues: Known descending thoracic aortic aneurysm. Diameter of 6.2 cm is unchanged. Disc levels: No spinal canal stenosis. MRI LUMBAR SPINE FINDINGS Segmentation:  Standard. Alignment:  Grade 1 anterolisthesis at L5-S1  Vertebrae:  No fracture, evidence of discitis, or bone lesion. Conus medullaris: Extends to the L1 level and appears normal. Paraspinal and other soft tissues: Infrarenal abdominal aortic aneurysm measures 5.0 cm, unchanged Disc levels: L1-L2:  Normal disc space and facet joints. No spinal canal stenosis. No neural foraminal stenosis. L2-L3: Normal disc space and facet joints. No spinal canal stenosis. No neural foraminal stenosis. L3-L4: Left asymmetric disc bulge. Mild facet hypertrophy. No spinal canal stenosis. No neural foraminal stenosis. L4-L5: Intermediate sized left asymmetric disc bulge with moderate facet arthrosis. Mild spinal canal stenosis. Moderate left neural foraminal stenosis. L5-S1: Severe facet arthrosis with partial disc uncovering. Mild spinal canal stenosis. No neural foraminal stenosis. Visualized sacrum: Normal. IMPRESSION: 1. No acute abnormality of the thoracic or lumbar spine. 2. Mild spinal canal stenosis and moderate left neural foraminal stenosis at L4-L5. 3. Severe facet arthrosis at L5-S1 with grade 1 anterolisthesis and mild spinal canal stenosis. 4. Unchanged thoracic and abdominal aortic aneurysms compared to 12/29/2021. Electronically Signed   By: Deatra Robinson M.D.   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 Vs mycotic aneurysm  #  Type 2 thoraco abdominal aneurysm, most notably left sided CIA aneurysm enlarging  Concerns for mycotic aneurysm vs MSK related per vascular but not surgical candidate Spoke with son who confirms patient is back to home hospice again once antibiotics were completed last week Completed IV abtx course as above 6/25 and off abtx since then Considered surveillance blood cultures *2, repeat CTA but will hold as patient  is back to home hospice and comfort measures.  Will not make further follow ups for now    I have personally spent 55  minutes involved in face-to-face and non-face-to-face activities for this patient on the day of the visit. Professional time spent includes the following activities: Preparing to see the patient (review of tests), Obtaining and/or reviewing separately obtained history (admission/discharge record), Performing a medically appropriate examination and/or evaluation , Ordering medications/tests/procedures, referring and communicating with other health care professionals, Documenting clinical information in the EMR, Independently interpreting results (not separately reported), Communicating results to the patient/family/caregiver, Counseling and educating the patient/family/caregiver and Care coordination (not separately reported).   Victoriano Lain, MD [facility] 03/15/2023, 10:58 AM

## 2023-04-07 ENCOUNTER — Ambulatory Visit: Payer: Medicare HMO | Admitting: Family Medicine

## 2023-04-28 ENCOUNTER — Encounter (INDEPENDENT_AMBULATORY_CARE_PROVIDER_SITE_OTHER): Payer: Self-pay

## 2023-06-14 DEATH — deceased

## 2023-07-25 ENCOUNTER — Ambulatory Visit: Payer: Medicare HMO | Admitting: Family Medicine
